# Patient Record
Sex: Female | Born: 1950
Health system: Southern US, Community
[De-identification: ages and names within clinical notes are randomized; demographics above are authoritative.]

## PROBLEM LIST (undated history)

## (undated) DIAGNOSIS — F419 Anxiety disorder, unspecified: Secondary | ICD-10-CM

## (undated) DIAGNOSIS — M1990 Unspecified osteoarthritis, unspecified site: Secondary | ICD-10-CM

## (undated) DIAGNOSIS — E079 Disorder of thyroid, unspecified: Secondary | ICD-10-CM

## (undated) DIAGNOSIS — E119 Type 2 diabetes mellitus without complications: Secondary | ICD-10-CM

## (undated) DIAGNOSIS — T7840XA Allergy, unspecified, initial encounter: Secondary | ICD-10-CM

## (undated) DIAGNOSIS — J449 Chronic obstructive pulmonary disease, unspecified: Secondary | ICD-10-CM

## (undated) DIAGNOSIS — E039 Hypothyroidism, unspecified: Secondary | ICD-10-CM

## (undated) DIAGNOSIS — F32A Depression, unspecified: Secondary | ICD-10-CM

## (undated) DIAGNOSIS — H269 Unspecified cataract: Secondary | ICD-10-CM

## (undated) DIAGNOSIS — L409 Psoriasis, unspecified: Secondary | ICD-10-CM

## (undated) HISTORY — DX: Unspecified cataract: H26.9

## (undated) HISTORY — PX: KNEE ARTHROSCOPY WITH MENISCAL REPAIR: SHX5653

## (undated) HISTORY — DX: Allergy, unspecified, initial encounter: T78.40XA

## (undated) HISTORY — PX: DENTAL SURGERY: SHX609

## (undated) HISTORY — PX: POLYPECTOMY: SHX149

## (undated) HISTORY — DX: Psoriasis, unspecified: L40.9

## (undated) HISTORY — PX: BREAST EXCISIONAL BIOPSY: SUR124

## (undated) HISTORY — DX: Chronic obstructive pulmonary disease, unspecified: J44.9

## (undated) HISTORY — PX: ABDOMINAL SURGERY: SHX537

## (undated) HISTORY — PX: COLONOSCOPY: SHX174

## (undated) HISTORY — DX: Anxiety disorder, unspecified: F41.9

## (undated) HISTORY — DX: Unspecified osteoarthritis, unspecified site: M19.90

---
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
## 1999-10-04 HISTORY — PX: ABDOMINAL HYSTERECTOMY: SHX81

## 2013-01-28 ENCOUNTER — Other Ambulatory Visit: Payer: Self-pay

## 2013-01-28 DIAGNOSIS — Z1231 Encounter for screening mammogram for malignant neoplasm of breast: Secondary | ICD-10-CM

## 2013-02-06 ENCOUNTER — Ambulatory Visit
Admission: RE | Admit: 2013-02-06 | Discharge: 2013-02-06 | Disposition: A | Discharge status: Home / Self Care | Payer: BC Managed Care – PPO | Source: Ambulatory Visit

## 2013-02-06 DIAGNOSIS — Z1231 Encounter for screening mammogram for malignant neoplasm of breast: Secondary | ICD-10-CM

## 2014-07-25 ENCOUNTER — Other Ambulatory Visit: Payer: Self-pay

## 2014-07-25 DIAGNOSIS — Z1231 Encounter for screening mammogram for malignant neoplasm of breast: Secondary | ICD-10-CM

## 2014-08-15 ENCOUNTER — Ambulatory Visit
Admission: RE | Admit: 2014-08-15 | Discharge: 2014-08-15 | Disposition: A | Discharge status: Home / Self Care | Payer: BC Managed Care – PPO | Source: Ambulatory Visit

## 2014-08-15 ENCOUNTER — Encounter (INDEPENDENT_AMBULATORY_CARE_PROVIDER_SITE_OTHER): Payer: Self-pay

## 2014-08-15 DIAGNOSIS — Z1231 Encounter for screening mammogram for malignant neoplasm of breast: Secondary | ICD-10-CM

## 2015-07-27 DIAGNOSIS — L409 Psoriasis, unspecified: Secondary | ICD-10-CM | POA: Diagnosis not present

## 2015-07-27 DIAGNOSIS — M2550 Pain in unspecified joint: Secondary | ICD-10-CM | POA: Diagnosis not present

## 2015-07-27 DIAGNOSIS — L920 Granuloma annulare: Secondary | ICD-10-CM | POA: Diagnosis not present

## 2015-07-27 DIAGNOSIS — B353 Tinea pedis: Secondary | ICD-10-CM | POA: Diagnosis not present

## 2015-08-06 DIAGNOSIS — H2513 Age-related nuclear cataract, bilateral: Secondary | ICD-10-CM | POA: Diagnosis not present

## 2015-08-06 DIAGNOSIS — H40013 Open angle with borderline findings, low risk, bilateral: Secondary | ICD-10-CM | POA: Diagnosis not present

## 2015-08-06 DIAGNOSIS — E119 Type 2 diabetes mellitus without complications: Secondary | ICD-10-CM | POA: Diagnosis not present

## 2015-08-17 DIAGNOSIS — M545 Low back pain: Secondary | ICD-10-CM | POA: Diagnosis not present

## 2015-08-17 DIAGNOSIS — L920 Granuloma annulare: Secondary | ICD-10-CM | POA: Diagnosis not present

## 2015-08-17 DIAGNOSIS — M25511 Pain in right shoulder: Secondary | ICD-10-CM | POA: Diagnosis not present

## 2015-08-17 DIAGNOSIS — L409 Psoriasis, unspecified: Secondary | ICD-10-CM | POA: Diagnosis not present

## 2015-09-01 DIAGNOSIS — L400 Psoriasis vulgaris: Secondary | ICD-10-CM | POA: Diagnosis not present

## 2015-09-01 DIAGNOSIS — L309 Dermatitis, unspecified: Secondary | ICD-10-CM | POA: Diagnosis not present

## 2015-09-14 ENCOUNTER — Other Ambulatory Visit: Payer: Self-pay | Admitting: Physician Assistant

## 2015-09-14 DIAGNOSIS — M25511 Pain in right shoulder: Secondary | ICD-10-CM

## 2015-09-24 ENCOUNTER — Ambulatory Visit
Admission: RE | Admit: 2015-09-24 | Discharge: 2015-09-24 | Disposition: A | Discharge status: Home / Self Care | Payer: Medicare Other | Source: Ambulatory Visit | Attending: Neurology | Admitting: Neurology

## 2015-09-24 DIAGNOSIS — M25511 Pain in right shoulder: Secondary | ICD-10-CM

## 2015-10-08 DIAGNOSIS — L920 Granuloma annulare: Secondary | ICD-10-CM | POA: Diagnosis not present

## 2015-10-08 DIAGNOSIS — M7501 Adhesive capsulitis of right shoulder: Secondary | ICD-10-CM | POA: Diagnosis not present

## 2015-10-08 DIAGNOSIS — M25511 Pain in right shoulder: Secondary | ICD-10-CM | POA: Diagnosis not present

## 2015-10-08 DIAGNOSIS — L409 Psoriasis, unspecified: Secondary | ICD-10-CM | POA: Diagnosis not present

## 2015-10-08 DIAGNOSIS — M545 Low back pain: Secondary | ICD-10-CM | POA: Diagnosis not present

## 2015-10-14 DIAGNOSIS — M6281 Muscle weakness (generalized): Secondary | ICD-10-CM | POA: Diagnosis not present

## 2015-10-14 DIAGNOSIS — M25611 Stiffness of right shoulder, not elsewhere classified: Secondary | ICD-10-CM | POA: Diagnosis not present

## 2015-10-14 DIAGNOSIS — R293 Abnormal posture: Secondary | ICD-10-CM | POA: Diagnosis not present

## 2015-10-14 DIAGNOSIS — M25511 Pain in right shoulder: Secondary | ICD-10-CM | POA: Diagnosis not present

## 2015-10-16 DIAGNOSIS — M25611 Stiffness of right shoulder, not elsewhere classified: Secondary | ICD-10-CM | POA: Diagnosis not present

## 2015-10-16 DIAGNOSIS — M6281 Muscle weakness (generalized): Secondary | ICD-10-CM | POA: Diagnosis not present

## 2015-10-16 DIAGNOSIS — M25511 Pain in right shoulder: Secondary | ICD-10-CM | POA: Diagnosis not present

## 2015-10-16 DIAGNOSIS — R293 Abnormal posture: Secondary | ICD-10-CM | POA: Diagnosis not present

## 2015-10-19 DIAGNOSIS — F329 Major depressive disorder, single episode, unspecified: Secondary | ICD-10-CM | POA: Diagnosis not present

## 2015-10-19 DIAGNOSIS — Z23 Encounter for immunization: Secondary | ICD-10-CM | POA: Diagnosis not present

## 2015-10-19 DIAGNOSIS — Z720 Tobacco use: Secondary | ICD-10-CM | POA: Diagnosis not present

## 2015-10-19 DIAGNOSIS — Z0000 Encounter for general adult medical examination without abnormal findings: Secondary | ICD-10-CM | POA: Diagnosis not present

## 2015-10-19 DIAGNOSIS — E119 Type 2 diabetes mellitus without complications: Secondary | ICD-10-CM | POA: Diagnosis not present

## 2015-10-19 DIAGNOSIS — Z86010 Personal history of colonic polyps: Secondary | ICD-10-CM | POA: Diagnosis not present

## 2015-10-19 DIAGNOSIS — L920 Granuloma annulare: Secondary | ICD-10-CM | POA: Diagnosis not present

## 2015-10-19 DIAGNOSIS — E039 Hypothyroidism, unspecified: Secondary | ICD-10-CM | POA: Diagnosis not present

## 2015-10-19 DIAGNOSIS — I10 Essential (primary) hypertension: Secondary | ICD-10-CM | POA: Diagnosis not present

## 2015-10-19 DIAGNOSIS — E7800 Pure hypercholesterolemia, unspecified: Secondary | ICD-10-CM | POA: Diagnosis not present

## 2015-10-20 DIAGNOSIS — M25511 Pain in right shoulder: Secondary | ICD-10-CM | POA: Diagnosis not present

## 2015-10-20 DIAGNOSIS — M25611 Stiffness of right shoulder, not elsewhere classified: Secondary | ICD-10-CM | POA: Diagnosis not present

## 2015-10-20 DIAGNOSIS — M6281 Muscle weakness (generalized): Secondary | ICD-10-CM | POA: Diagnosis not present

## 2015-10-20 DIAGNOSIS — R293 Abnormal posture: Secondary | ICD-10-CM | POA: Diagnosis not present

## 2015-10-21 DIAGNOSIS — M25511 Pain in right shoulder: Secondary | ICD-10-CM | POA: Diagnosis not present

## 2015-10-21 DIAGNOSIS — M6281 Muscle weakness (generalized): Secondary | ICD-10-CM | POA: Diagnosis not present

## 2015-10-21 DIAGNOSIS — M25611 Stiffness of right shoulder, not elsewhere classified: Secondary | ICD-10-CM | POA: Diagnosis not present

## 2015-10-21 DIAGNOSIS — R293 Abnormal posture: Secondary | ICD-10-CM | POA: Diagnosis not present

## 2015-10-22 DIAGNOSIS — M25611 Stiffness of right shoulder, not elsewhere classified: Secondary | ICD-10-CM | POA: Diagnosis not present

## 2015-10-22 DIAGNOSIS — M6281 Muscle weakness (generalized): Secondary | ICD-10-CM | POA: Diagnosis not present

## 2015-10-22 DIAGNOSIS — R293 Abnormal posture: Secondary | ICD-10-CM | POA: Diagnosis not present

## 2015-10-22 DIAGNOSIS — M25511 Pain in right shoulder: Secondary | ICD-10-CM | POA: Diagnosis not present

## 2015-10-26 DIAGNOSIS — R293 Abnormal posture: Secondary | ICD-10-CM | POA: Diagnosis not present

## 2015-10-26 DIAGNOSIS — M25511 Pain in right shoulder: Secondary | ICD-10-CM | POA: Diagnosis not present

## 2015-10-26 DIAGNOSIS — M25611 Stiffness of right shoulder, not elsewhere classified: Secondary | ICD-10-CM | POA: Diagnosis not present

## 2015-10-26 DIAGNOSIS — M6281 Muscle weakness (generalized): Secondary | ICD-10-CM | POA: Diagnosis not present

## 2015-10-28 DIAGNOSIS — M25511 Pain in right shoulder: Secondary | ICD-10-CM | POA: Diagnosis not present

## 2015-10-28 DIAGNOSIS — M25611 Stiffness of right shoulder, not elsewhere classified: Secondary | ICD-10-CM | POA: Diagnosis not present

## 2015-10-28 DIAGNOSIS — R293 Abnormal posture: Secondary | ICD-10-CM | POA: Diagnosis not present

## 2015-10-28 DIAGNOSIS — M6281 Muscle weakness (generalized): Secondary | ICD-10-CM | POA: Diagnosis not present

## 2015-10-30 DIAGNOSIS — M6281 Muscle weakness (generalized): Secondary | ICD-10-CM | POA: Diagnosis not present

## 2015-10-30 DIAGNOSIS — M25511 Pain in right shoulder: Secondary | ICD-10-CM | POA: Diagnosis not present

## 2015-10-30 DIAGNOSIS — M25611 Stiffness of right shoulder, not elsewhere classified: Secondary | ICD-10-CM | POA: Diagnosis not present

## 2015-10-30 DIAGNOSIS — R293 Abnormal posture: Secondary | ICD-10-CM | POA: Diagnosis not present

## 2015-11-02 DIAGNOSIS — M6281 Muscle weakness (generalized): Secondary | ICD-10-CM | POA: Diagnosis not present

## 2015-11-02 DIAGNOSIS — R293 Abnormal posture: Secondary | ICD-10-CM | POA: Diagnosis not present

## 2015-11-02 DIAGNOSIS — M25611 Stiffness of right shoulder, not elsewhere classified: Secondary | ICD-10-CM | POA: Diagnosis not present

## 2015-11-02 DIAGNOSIS — M25511 Pain in right shoulder: Secondary | ICD-10-CM | POA: Diagnosis not present

## 2015-11-04 DIAGNOSIS — M25611 Stiffness of right shoulder, not elsewhere classified: Secondary | ICD-10-CM | POA: Diagnosis not present

## 2015-11-04 DIAGNOSIS — M25511 Pain in right shoulder: Secondary | ICD-10-CM | POA: Diagnosis not present

## 2015-11-04 DIAGNOSIS — M6281 Muscle weakness (generalized): Secondary | ICD-10-CM | POA: Diagnosis not present

## 2015-11-04 DIAGNOSIS — R293 Abnormal posture: Secondary | ICD-10-CM | POA: Diagnosis not present

## 2015-11-06 DIAGNOSIS — M25511 Pain in right shoulder: Secondary | ICD-10-CM | POA: Diagnosis not present

## 2015-11-06 DIAGNOSIS — R293 Abnormal posture: Secondary | ICD-10-CM | POA: Diagnosis not present

## 2015-11-06 DIAGNOSIS — M25611 Stiffness of right shoulder, not elsewhere classified: Secondary | ICD-10-CM | POA: Diagnosis not present

## 2015-11-06 DIAGNOSIS — M6281 Muscle weakness (generalized): Secondary | ICD-10-CM | POA: Diagnosis not present

## 2015-11-09 DIAGNOSIS — M25511 Pain in right shoulder: Secondary | ICD-10-CM | POA: Diagnosis not present

## 2015-11-09 DIAGNOSIS — M25611 Stiffness of right shoulder, not elsewhere classified: Secondary | ICD-10-CM | POA: Diagnosis not present

## 2015-11-09 DIAGNOSIS — R293 Abnormal posture: Secondary | ICD-10-CM | POA: Diagnosis not present

## 2015-11-09 DIAGNOSIS — M6281 Muscle weakness (generalized): Secondary | ICD-10-CM | POA: Diagnosis not present

## 2015-11-11 DIAGNOSIS — M6281 Muscle weakness (generalized): Secondary | ICD-10-CM | POA: Diagnosis not present

## 2015-11-11 DIAGNOSIS — M25511 Pain in right shoulder: Secondary | ICD-10-CM | POA: Diagnosis not present

## 2015-11-11 DIAGNOSIS — R293 Abnormal posture: Secondary | ICD-10-CM | POA: Diagnosis not present

## 2015-11-11 DIAGNOSIS — M25611 Stiffness of right shoulder, not elsewhere classified: Secondary | ICD-10-CM | POA: Diagnosis not present

## 2015-11-13 DIAGNOSIS — M25611 Stiffness of right shoulder, not elsewhere classified: Secondary | ICD-10-CM | POA: Diagnosis not present

## 2015-11-13 DIAGNOSIS — M25511 Pain in right shoulder: Secondary | ICD-10-CM | POA: Diagnosis not present

## 2015-11-13 DIAGNOSIS — M6281 Muscle weakness (generalized): Secondary | ICD-10-CM | POA: Diagnosis not present

## 2015-11-13 DIAGNOSIS — R293 Abnormal posture: Secondary | ICD-10-CM | POA: Diagnosis not present

## 2015-11-16 DIAGNOSIS — R293 Abnormal posture: Secondary | ICD-10-CM | POA: Diagnosis not present

## 2015-11-16 DIAGNOSIS — M25511 Pain in right shoulder: Secondary | ICD-10-CM | POA: Diagnosis not present

## 2015-11-16 DIAGNOSIS — M6281 Muscle weakness (generalized): Secondary | ICD-10-CM | POA: Diagnosis not present

## 2015-11-16 DIAGNOSIS — M25611 Stiffness of right shoulder, not elsewhere classified: Secondary | ICD-10-CM | POA: Diagnosis not present

## 2015-11-18 DIAGNOSIS — M25511 Pain in right shoulder: Secondary | ICD-10-CM | POA: Diagnosis not present

## 2015-11-18 DIAGNOSIS — M25611 Stiffness of right shoulder, not elsewhere classified: Secondary | ICD-10-CM | POA: Diagnosis not present

## 2015-11-18 DIAGNOSIS — M6281 Muscle weakness (generalized): Secondary | ICD-10-CM | POA: Diagnosis not present

## 2015-11-18 DIAGNOSIS — R293 Abnormal posture: Secondary | ICD-10-CM | POA: Diagnosis not present

## 2015-11-20 DIAGNOSIS — R293 Abnormal posture: Secondary | ICD-10-CM | POA: Diagnosis not present

## 2015-11-20 DIAGNOSIS — M25511 Pain in right shoulder: Secondary | ICD-10-CM | POA: Diagnosis not present

## 2015-11-20 DIAGNOSIS — M25611 Stiffness of right shoulder, not elsewhere classified: Secondary | ICD-10-CM | POA: Diagnosis not present

## 2015-11-20 DIAGNOSIS — M6281 Muscle weakness (generalized): Secondary | ICD-10-CM | POA: Diagnosis not present

## 2015-11-25 DIAGNOSIS — D124 Benign neoplasm of descending colon: Secondary | ICD-10-CM | POA: Diagnosis not present

## 2015-11-25 DIAGNOSIS — K640 First degree hemorrhoids: Secondary | ICD-10-CM | POA: Diagnosis not present

## 2015-11-25 DIAGNOSIS — K5730 Diverticulosis of large intestine without perforation or abscess without bleeding: Secondary | ICD-10-CM | POA: Diagnosis not present

## 2015-11-25 DIAGNOSIS — Z86010 Personal history of colonic polyps: Secondary | ICD-10-CM | POA: Diagnosis not present

## 2015-11-25 DIAGNOSIS — D126 Benign neoplasm of colon, unspecified: Secondary | ICD-10-CM | POA: Diagnosis not present

## 2015-11-25 LAB — HM COLONOSCOPY

## 2015-11-27 DIAGNOSIS — R293 Abnormal posture: Secondary | ICD-10-CM | POA: Diagnosis not present

## 2015-11-27 DIAGNOSIS — M25511 Pain in right shoulder: Secondary | ICD-10-CM | POA: Diagnosis not present

## 2015-11-27 DIAGNOSIS — M25611 Stiffness of right shoulder, not elsewhere classified: Secondary | ICD-10-CM | POA: Diagnosis not present

## 2015-11-27 DIAGNOSIS — M6281 Muscle weakness (generalized): Secondary | ICD-10-CM | POA: Diagnosis not present

## 2015-11-30 DIAGNOSIS — R293 Abnormal posture: Secondary | ICD-10-CM | POA: Diagnosis not present

## 2015-11-30 DIAGNOSIS — M25611 Stiffness of right shoulder, not elsewhere classified: Secondary | ICD-10-CM | POA: Diagnosis not present

## 2015-11-30 DIAGNOSIS — M25511 Pain in right shoulder: Secondary | ICD-10-CM | POA: Diagnosis not present

## 2015-11-30 DIAGNOSIS — M6281 Muscle weakness (generalized): Secondary | ICD-10-CM | POA: Diagnosis not present

## 2015-12-02 DIAGNOSIS — M6281 Muscle weakness (generalized): Secondary | ICD-10-CM | POA: Diagnosis not present

## 2015-12-02 DIAGNOSIS — M25511 Pain in right shoulder: Secondary | ICD-10-CM | POA: Diagnosis not present

## 2015-12-02 DIAGNOSIS — M25611 Stiffness of right shoulder, not elsewhere classified: Secondary | ICD-10-CM | POA: Diagnosis not present

## 2015-12-02 DIAGNOSIS — R293 Abnormal posture: Secondary | ICD-10-CM | POA: Diagnosis not present

## 2015-12-04 DIAGNOSIS — M6281 Muscle weakness (generalized): Secondary | ICD-10-CM | POA: Diagnosis not present

## 2015-12-04 DIAGNOSIS — M25611 Stiffness of right shoulder, not elsewhere classified: Secondary | ICD-10-CM | POA: Diagnosis not present

## 2015-12-04 DIAGNOSIS — R293 Abnormal posture: Secondary | ICD-10-CM | POA: Diagnosis not present

## 2015-12-04 DIAGNOSIS — M25511 Pain in right shoulder: Secondary | ICD-10-CM | POA: Diagnosis not present

## 2015-12-07 DIAGNOSIS — R293 Abnormal posture: Secondary | ICD-10-CM | POA: Diagnosis not present

## 2015-12-07 DIAGNOSIS — M6281 Muscle weakness (generalized): Secondary | ICD-10-CM | POA: Diagnosis not present

## 2015-12-07 DIAGNOSIS — M25611 Stiffness of right shoulder, not elsewhere classified: Secondary | ICD-10-CM | POA: Diagnosis not present

## 2015-12-07 DIAGNOSIS — M25511 Pain in right shoulder: Secondary | ICD-10-CM | POA: Diagnosis not present

## 2015-12-14 DIAGNOSIS — M25511 Pain in right shoulder: Secondary | ICD-10-CM | POA: Diagnosis not present

## 2015-12-14 DIAGNOSIS — M6281 Muscle weakness (generalized): Secondary | ICD-10-CM | POA: Diagnosis not present

## 2015-12-14 DIAGNOSIS — R293 Abnormal posture: Secondary | ICD-10-CM | POA: Diagnosis not present

## 2015-12-14 DIAGNOSIS — M25611 Stiffness of right shoulder, not elsewhere classified: Secondary | ICD-10-CM | POA: Diagnosis not present

## 2015-12-21 DIAGNOSIS — M6281 Muscle weakness (generalized): Secondary | ICD-10-CM | POA: Diagnosis not present

## 2015-12-21 DIAGNOSIS — R293 Abnormal posture: Secondary | ICD-10-CM | POA: Diagnosis not present

## 2015-12-21 DIAGNOSIS — M25611 Stiffness of right shoulder, not elsewhere classified: Secondary | ICD-10-CM | POA: Diagnosis not present

## 2015-12-21 DIAGNOSIS — M25511 Pain in right shoulder: Secondary | ICD-10-CM | POA: Diagnosis not present

## 2015-12-25 DIAGNOSIS — R293 Abnormal posture: Secondary | ICD-10-CM | POA: Diagnosis not present

## 2015-12-25 DIAGNOSIS — M6281 Muscle weakness (generalized): Secondary | ICD-10-CM | POA: Diagnosis not present

## 2015-12-25 DIAGNOSIS — M25611 Stiffness of right shoulder, not elsewhere classified: Secondary | ICD-10-CM | POA: Diagnosis not present

## 2015-12-25 DIAGNOSIS — M25511 Pain in right shoulder: Secondary | ICD-10-CM | POA: Diagnosis not present

## 2015-12-28 DIAGNOSIS — M6281 Muscle weakness (generalized): Secondary | ICD-10-CM | POA: Diagnosis not present

## 2015-12-28 DIAGNOSIS — M25611 Stiffness of right shoulder, not elsewhere classified: Secondary | ICD-10-CM | POA: Diagnosis not present

## 2015-12-28 DIAGNOSIS — M25511 Pain in right shoulder: Secondary | ICD-10-CM | POA: Diagnosis not present

## 2015-12-28 DIAGNOSIS — R293 Abnormal posture: Secondary | ICD-10-CM | POA: Diagnosis not present

## 2016-01-01 DIAGNOSIS — M6281 Muscle weakness (generalized): Secondary | ICD-10-CM | POA: Diagnosis not present

## 2016-01-01 DIAGNOSIS — M25611 Stiffness of right shoulder, not elsewhere classified: Secondary | ICD-10-CM | POA: Diagnosis not present

## 2016-01-01 DIAGNOSIS — R293 Abnormal posture: Secondary | ICD-10-CM | POA: Diagnosis not present

## 2016-01-01 DIAGNOSIS — M25511 Pain in right shoulder: Secondary | ICD-10-CM | POA: Diagnosis not present

## 2016-01-04 DIAGNOSIS — M25511 Pain in right shoulder: Secondary | ICD-10-CM | POA: Diagnosis not present

## 2016-01-04 DIAGNOSIS — M6281 Muscle weakness (generalized): Secondary | ICD-10-CM | POA: Diagnosis not present

## 2016-01-04 DIAGNOSIS — R293 Abnormal posture: Secondary | ICD-10-CM | POA: Diagnosis not present

## 2016-01-04 DIAGNOSIS — M25611 Stiffness of right shoulder, not elsewhere classified: Secondary | ICD-10-CM | POA: Diagnosis not present

## 2016-01-11 DIAGNOSIS — M25611 Stiffness of right shoulder, not elsewhere classified: Secondary | ICD-10-CM | POA: Diagnosis not present

## 2016-01-11 DIAGNOSIS — M25511 Pain in right shoulder: Secondary | ICD-10-CM | POA: Diagnosis not present

## 2016-01-11 DIAGNOSIS — R293 Abnormal posture: Secondary | ICD-10-CM | POA: Diagnosis not present

## 2016-01-11 DIAGNOSIS — M6281 Muscle weakness (generalized): Secondary | ICD-10-CM | POA: Diagnosis not present

## 2016-01-19 DIAGNOSIS — M25611 Stiffness of right shoulder, not elsewhere classified: Secondary | ICD-10-CM | POA: Diagnosis not present

## 2016-01-19 DIAGNOSIS — M6281 Muscle weakness (generalized): Secondary | ICD-10-CM | POA: Diagnosis not present

## 2016-01-19 DIAGNOSIS — R293 Abnormal posture: Secondary | ICD-10-CM | POA: Diagnosis not present

## 2016-01-19 DIAGNOSIS — M25511 Pain in right shoulder: Secondary | ICD-10-CM | POA: Diagnosis not present

## 2016-02-01 DIAGNOSIS — M6281 Muscle weakness (generalized): Secondary | ICD-10-CM | POA: Diagnosis not present

## 2016-02-01 DIAGNOSIS — M25611 Stiffness of right shoulder, not elsewhere classified: Secondary | ICD-10-CM | POA: Diagnosis not present

## 2016-02-01 DIAGNOSIS — M25511 Pain in right shoulder: Secondary | ICD-10-CM | POA: Diagnosis not present

## 2016-02-01 DIAGNOSIS — R293 Abnormal posture: Secondary | ICD-10-CM | POA: Diagnosis not present

## 2016-02-10 DIAGNOSIS — M25611 Stiffness of right shoulder, not elsewhere classified: Secondary | ICD-10-CM | POA: Diagnosis not present

## 2016-02-10 DIAGNOSIS — M25511 Pain in right shoulder: Secondary | ICD-10-CM | POA: Diagnosis not present

## 2016-02-10 DIAGNOSIS — M6281 Muscle weakness (generalized): Secondary | ICD-10-CM | POA: Diagnosis not present

## 2016-02-10 DIAGNOSIS — R293 Abnormal posture: Secondary | ICD-10-CM | POA: Diagnosis not present

## 2016-03-01 DIAGNOSIS — L400 Psoriasis vulgaris: Secondary | ICD-10-CM | POA: Diagnosis not present

## 2016-04-20 DIAGNOSIS — I10 Essential (primary) hypertension: Secondary | ICD-10-CM | POA: Diagnosis not present

## 2016-04-20 DIAGNOSIS — Z7984 Long term (current) use of oral hypoglycemic drugs: Secondary | ICD-10-CM | POA: Diagnosis not present

## 2016-04-20 DIAGNOSIS — F329 Major depressive disorder, single episode, unspecified: Secondary | ICD-10-CM | POA: Diagnosis not present

## 2016-04-20 DIAGNOSIS — E119 Type 2 diabetes mellitus without complications: Secondary | ICD-10-CM | POA: Diagnosis not present

## 2016-04-20 DIAGNOSIS — E039 Hypothyroidism, unspecified: Secondary | ICD-10-CM | POA: Diagnosis not present

## 2016-04-20 DIAGNOSIS — E663 Overweight: Secondary | ICD-10-CM | POA: Diagnosis not present

## 2016-04-20 DIAGNOSIS — Z6826 Body mass index (BMI) 26.0-26.9, adult: Secondary | ICD-10-CM | POA: Diagnosis not present

## 2016-04-20 DIAGNOSIS — M25511 Pain in right shoulder: Secondary | ICD-10-CM | POA: Diagnosis not present

## 2016-04-20 DIAGNOSIS — E7800 Pure hypercholesterolemia, unspecified: Secondary | ICD-10-CM | POA: Diagnosis not present

## 2016-05-05 DIAGNOSIS — M7501 Adhesive capsulitis of right shoulder: Secondary | ICD-10-CM | POA: Diagnosis not present

## 2016-08-11 DIAGNOSIS — H40012 Open angle with borderline findings, low risk, left eye: Secondary | ICD-10-CM | POA: Diagnosis not present

## 2016-08-11 DIAGNOSIS — H2513 Age-related nuclear cataract, bilateral: Secondary | ICD-10-CM | POA: Diagnosis not present

## 2016-08-11 DIAGNOSIS — H40011 Open angle with borderline findings, low risk, right eye: Secondary | ICD-10-CM | POA: Diagnosis not present

## 2016-08-11 DIAGNOSIS — Z0100 Encounter for examination of eyes and vision without abnormal findings: Secondary | ICD-10-CM | POA: Diagnosis not present

## 2016-08-17 ENCOUNTER — Emergency Department
Admission: EM | Admit: 2016-08-17 | Discharge: 2016-08-17 | Disposition: A | Discharge status: Home / Self Care | Payer: No Typology Code available for payment source | Attending: Emergency Medicine | Admitting: Emergency Medicine

## 2016-08-17 ENCOUNTER — Emergency Department: Payer: No Typology Code available for payment source

## 2016-08-17 DIAGNOSIS — S161XXA Strain of muscle, fascia and tendon at neck level, initial encounter: Secondary | ICD-10-CM | POA: Diagnosis not present

## 2016-08-17 DIAGNOSIS — Y999 Unspecified external cause status: Secondary | ICD-10-CM | POA: Insufficient documentation

## 2016-08-17 DIAGNOSIS — M7918 Myalgia, other site: Secondary | ICD-10-CM

## 2016-08-17 DIAGNOSIS — M25511 Pain in right shoulder: Secondary | ICD-10-CM | POA: Diagnosis not present

## 2016-08-17 DIAGNOSIS — Y9389 Activity, other specified: Secondary | ICD-10-CM | POA: Insufficient documentation

## 2016-08-17 DIAGNOSIS — S4991XA Unspecified injury of right shoulder and upper arm, initial encounter: Secondary | ICD-10-CM | POA: Diagnosis not present

## 2016-08-17 DIAGNOSIS — Y92410 Unspecified street and highway as the place of occurrence of the external cause: Secondary | ICD-10-CM | POA: Diagnosis not present

## 2016-08-17 DIAGNOSIS — S199XXA Unspecified injury of neck, initial encounter: Secondary | ICD-10-CM | POA: Diagnosis present

## 2016-08-17 DIAGNOSIS — M791 Myalgia: Secondary | ICD-10-CM | POA: Diagnosis not present

## 2016-08-17 MED ORDER — NAPROXEN 500 MG PO TABS
500.0000 mg | ORAL_TABLET | Freq: Once | ORAL | Status: AC
  Administered 2016-08-17: 500 mg via ORAL
  Filled 2016-08-17: qty 1

## 2016-08-17 MED ORDER — CYCLOBENZAPRINE HCL 10 MG PO TABS
ORAL_TABLET | ORAL | Status: AC
  Filled 2016-08-17: qty 1

## 2016-08-17 MED ORDER — CYCLOBENZAPRINE HCL 10 MG PO TABS
10.0000 mg | ORAL_TABLET | Freq: Once | ORAL | Status: AC
  Administered 2016-08-17: 10 mg via ORAL
  Filled 2016-08-17: qty 1

## 2016-08-17 MED ORDER — MELOXICAM 15 MG PO TABS: 15.0000 mg | ORAL_TABLET | Freq: Every day | ORAL | 0 refills | Status: DC

## 2016-08-17 MED ORDER — CYCLOBENZAPRINE HCL 10 MG PO TABS: 5.0000 mg | ORAL_TABLET | Freq: Three times a day (TID) | ORAL | 0 refills | Status: DC | PRN

## 2016-08-17 NOTE — ED Notes (Signed)
Pt verbalized understanding of discharge instructions. NAD at this time. 
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
## 2016-08-20 MED ORDER — FENTANYL CITRATE (PF) 100 MCG/2ML IJ SOLN
INTRAMUSCULAR | Status: AC
  Filled 2016-08-20: qty 2

## 2016-08-30 DIAGNOSIS — L821 Other seborrheic keratosis: Secondary | ICD-10-CM | POA: Diagnosis not present

## 2016-08-30 DIAGNOSIS — L820 Inflamed seborrheic keratosis: Secondary | ICD-10-CM | POA: Diagnosis not present

## 2016-08-30 DIAGNOSIS — L400 Psoriasis vulgaris: Secondary | ICD-10-CM | POA: Diagnosis not present

## 2016-08-30 DIAGNOSIS — L814 Other melanin hyperpigmentation: Secondary | ICD-10-CM | POA: Diagnosis not present

## 2016-08-30 DIAGNOSIS — L920 Granuloma annulare: Secondary | ICD-10-CM | POA: Diagnosis not present

## 2016-08-30 DIAGNOSIS — D1801 Hemangioma of skin and subcutaneous tissue: Secondary | ICD-10-CM | POA: Diagnosis not present

## 2016-08-30 DIAGNOSIS — D235 Other benign neoplasm of skin of trunk: Secondary | ICD-10-CM | POA: Diagnosis not present

## 2016-09-14 ENCOUNTER — Other Ambulatory Visit: Payer: Self-pay | Admitting: Family Medicine

## 2016-09-14 DIAGNOSIS — Z1231 Encounter for screening mammogram for malignant neoplasm of breast: Secondary | ICD-10-CM

## 2016-10-03 HISTORY — PX: EYE SURGERY: SHX253

## 2016-10-11 DIAGNOSIS — Z0000 Encounter for general adult medical examination without abnormal findings: Secondary | ICD-10-CM | POA: Diagnosis not present

## 2016-10-11 DIAGNOSIS — Z780 Asymptomatic menopausal state: Secondary | ICD-10-CM | POA: Diagnosis not present

## 2016-10-11 DIAGNOSIS — G4700 Insomnia, unspecified: Secondary | ICD-10-CM | POA: Diagnosis not present

## 2016-10-11 DIAGNOSIS — F329 Major depressive disorder, single episode, unspecified: Secondary | ICD-10-CM | POA: Diagnosis not present

## 2016-10-11 DIAGNOSIS — E663 Overweight: Secondary | ICD-10-CM | POA: Diagnosis not present

## 2016-10-11 DIAGNOSIS — E039 Hypothyroidism, unspecified: Secondary | ICD-10-CM | POA: Diagnosis not present

## 2016-10-11 DIAGNOSIS — R0981 Nasal congestion: Secondary | ICD-10-CM | POA: Diagnosis not present

## 2016-10-11 DIAGNOSIS — E119 Type 2 diabetes mellitus without complications: Secondary | ICD-10-CM | POA: Diagnosis not present

## 2016-10-11 DIAGNOSIS — Z720 Tobacco use: Secondary | ICD-10-CM | POA: Diagnosis not present

## 2016-10-11 DIAGNOSIS — R5383 Other fatigue: Secondary | ICD-10-CM | POA: Diagnosis not present

## 2016-10-11 DIAGNOSIS — L920 Granuloma annulare: Secondary | ICD-10-CM | POA: Diagnosis not present

## 2016-10-11 DIAGNOSIS — E7800 Pure hypercholesterolemia, unspecified: Secondary | ICD-10-CM | POA: Diagnosis not present

## 2016-10-27 ENCOUNTER — Ambulatory Visit
Admission: RE | Admit: 2016-10-27 | Discharge: 2016-10-27 | Disposition: A | Discharge status: Home / Self Care | Payer: Medicare Other | Source: Ambulatory Visit | Attending: Family Medicine | Admitting: Family Medicine

## 2016-10-27 DIAGNOSIS — Z1231 Encounter for screening mammogram for malignant neoplasm of breast: Secondary | ICD-10-CM | POA: Diagnosis not present

## 2016-11-08 DIAGNOSIS — H25812 Combined forms of age-related cataract, left eye: Secondary | ICD-10-CM | POA: Diagnosis not present

## 2016-11-08 DIAGNOSIS — H2512 Age-related nuclear cataract, left eye: Secondary | ICD-10-CM | POA: Diagnosis not present

## 2016-11-21 DIAGNOSIS — M8588 Other specified disorders of bone density and structure, other site: Secondary | ICD-10-CM | POA: Diagnosis not present

## 2016-11-21 DIAGNOSIS — Z780 Asymptomatic menopausal state: Secondary | ICD-10-CM | POA: Diagnosis not present

## 2016-12-16 DIAGNOSIS — R197 Diarrhea, unspecified: Secondary | ICD-10-CM | POA: Diagnosis not present

## 2016-12-16 DIAGNOSIS — Z720 Tobacco use: Secondary | ICD-10-CM | POA: Diagnosis not present

## 2016-12-16 DIAGNOSIS — R5383 Other fatigue: Secondary | ICD-10-CM | POA: Diagnosis not present

## 2016-12-16 DIAGNOSIS — R232 Flushing: Secondary | ICD-10-CM | POA: Diagnosis not present

## 2016-12-22 DIAGNOSIS — D72829 Elevated white blood cell count, unspecified: Secondary | ICD-10-CM | POA: Diagnosis not present

## 2016-12-22 DIAGNOSIS — R110 Nausea: Secondary | ICD-10-CM | POA: Diagnosis not present

## 2016-12-22 DIAGNOSIS — R8290 Unspecified abnormal findings in urine: Secondary | ICD-10-CM | POA: Diagnosis not present

## 2016-12-27 DIAGNOSIS — H2511 Age-related nuclear cataract, right eye: Secondary | ICD-10-CM | POA: Diagnosis not present

## 2016-12-27 DIAGNOSIS — H25811 Combined forms of age-related cataract, right eye: Secondary | ICD-10-CM | POA: Diagnosis not present

## 2016-12-27 DIAGNOSIS — H5211 Myopia, right eye: Secondary | ICD-10-CM | POA: Diagnosis not present

## 2017-04-11 DIAGNOSIS — L400 Psoriasis vulgaris: Secondary | ICD-10-CM | POA: Diagnosis not present

## 2017-04-17 DIAGNOSIS — E7800 Pure hypercholesterolemia, unspecified: Secondary | ICD-10-CM | POA: Diagnosis not present

## 2017-04-17 DIAGNOSIS — E039 Hypothyroidism, unspecified: Secondary | ICD-10-CM | POA: Diagnosis not present

## 2017-04-17 DIAGNOSIS — Z7984 Long term (current) use of oral hypoglycemic drugs: Secondary | ICD-10-CM | POA: Diagnosis not present

## 2017-04-17 DIAGNOSIS — F329 Major depressive disorder, single episode, unspecified: Secondary | ICD-10-CM | POA: Diagnosis not present

## 2017-04-17 DIAGNOSIS — E119 Type 2 diabetes mellitus without complications: Secondary | ICD-10-CM | POA: Diagnosis not present

## 2017-06-01 DIAGNOSIS — Z961 Presence of intraocular lens: Secondary | ICD-10-CM | POA: Diagnosis not present

## 2017-06-01 DIAGNOSIS — H1859 Other hereditary corneal dystrophies: Secondary | ICD-10-CM | POA: Diagnosis not present

## 2017-06-27 DIAGNOSIS — L400 Psoriasis vulgaris: Secondary | ICD-10-CM | POA: Diagnosis not present

## 2017-07-04 DIAGNOSIS — M7542 Impingement syndrome of left shoulder: Secondary | ICD-10-CM | POA: Diagnosis not present

## 2017-08-01 DIAGNOSIS — E7800 Pure hypercholesterolemia, unspecified: Secondary | ICD-10-CM | POA: Diagnosis not present

## 2017-08-01 DIAGNOSIS — E119 Type 2 diabetes mellitus without complications: Secondary | ICD-10-CM | POA: Diagnosis not present

## 2017-08-01 DIAGNOSIS — F329 Major depressive disorder, single episode, unspecified: Secondary | ICD-10-CM | POA: Diagnosis not present

## 2017-08-01 DIAGNOSIS — E039 Hypothyroidism, unspecified: Secondary | ICD-10-CM | POA: Diagnosis not present

## 2017-08-04 DIAGNOSIS — M7542 Impingement syndrome of left shoulder: Secondary | ICD-10-CM | POA: Diagnosis not present

## 2017-08-15 DIAGNOSIS — M7542 Impingement syndrome of left shoulder: Secondary | ICD-10-CM | POA: Diagnosis not present

## 2017-08-15 DIAGNOSIS — M5030 Other cervical disc degeneration, unspecified cervical region: Secondary | ICD-10-CM | POA: Diagnosis not present

## 2017-08-22 DIAGNOSIS — L400 Psoriasis vulgaris: Secondary | ICD-10-CM | POA: Diagnosis not present

## 2017-08-29 DIAGNOSIS — M7542 Impingement syndrome of left shoulder: Secondary | ICD-10-CM | POA: Diagnosis not present

## 2017-09-08 DIAGNOSIS — M7542 Impingement syndrome of left shoulder: Secondary | ICD-10-CM | POA: Diagnosis not present

## 2017-09-13 DIAGNOSIS — M7542 Impingement syndrome of left shoulder: Secondary | ICD-10-CM | POA: Diagnosis not present

## 2017-09-13 DIAGNOSIS — M5030 Other cervical disc degeneration, unspecified cervical region: Secondary | ICD-10-CM | POA: Diagnosis not present

## 2017-10-10 DIAGNOSIS — F329 Major depressive disorder, single episode, unspecified: Secondary | ICD-10-CM | POA: Diagnosis not present

## 2017-10-10 DIAGNOSIS — E039 Hypothyroidism, unspecified: Secondary | ICD-10-CM | POA: Diagnosis not present

## 2017-10-10 DIAGNOSIS — E7800 Pure hypercholesterolemia, unspecified: Secondary | ICD-10-CM | POA: Diagnosis not present

## 2017-10-10 DIAGNOSIS — E119 Type 2 diabetes mellitus without complications: Secondary | ICD-10-CM | POA: Diagnosis not present

## 2017-10-18 DIAGNOSIS — I10 Essential (primary) hypertension: Secondary | ICD-10-CM | POA: Diagnosis not present

## 2017-10-18 DIAGNOSIS — E663 Overweight: Secondary | ICD-10-CM | POA: Diagnosis not present

## 2017-10-18 DIAGNOSIS — M8580 Other specified disorders of bone density and structure, unspecified site: Secondary | ICD-10-CM | POA: Diagnosis not present

## 2017-10-18 DIAGNOSIS — Z86010 Personal history of colonic polyps: Secondary | ICD-10-CM | POA: Diagnosis not present

## 2017-10-18 DIAGNOSIS — E7800 Pure hypercholesterolemia, unspecified: Secondary | ICD-10-CM | POA: Diagnosis not present

## 2017-10-18 DIAGNOSIS — Z720 Tobacco use: Secondary | ICD-10-CM | POA: Diagnosis not present

## 2017-10-18 DIAGNOSIS — Z0000 Encounter for general adult medical examination without abnormal findings: Secondary | ICD-10-CM | POA: Diagnosis not present

## 2017-10-18 DIAGNOSIS — R05 Cough: Secondary | ICD-10-CM | POA: Diagnosis not present

## 2017-10-18 DIAGNOSIS — Z23 Encounter for immunization: Secondary | ICD-10-CM | POA: Diagnosis not present

## 2017-10-18 DIAGNOSIS — E119 Type 2 diabetes mellitus without complications: Secondary | ICD-10-CM | POA: Diagnosis not present

## 2017-10-18 DIAGNOSIS — E039 Hypothyroidism, unspecified: Secondary | ICD-10-CM | POA: Diagnosis not present

## 2017-10-18 DIAGNOSIS — G4700 Insomnia, unspecified: Secondary | ICD-10-CM | POA: Diagnosis not present

## 2017-12-28 ENCOUNTER — Other Ambulatory Visit: Payer: Self-pay | Admitting: Family Medicine

## 2017-12-28 DIAGNOSIS — Z1231 Encounter for screening mammogram for malignant neoplasm of breast: Secondary | ICD-10-CM

## 2018-01-23 ENCOUNTER — Ambulatory Visit
Admission: RE | Admit: 2018-01-23 | Discharge: 2018-01-23 | Disposition: A | Discharge status: Home / Self Care | Payer: Medicare Other | Source: Ambulatory Visit | Attending: Family Medicine | Admitting: Family Medicine

## 2018-01-23 DIAGNOSIS — Z1231 Encounter for screening mammogram for malignant neoplasm of breast: Secondary | ICD-10-CM | POA: Diagnosis not present

## 2018-01-23 LAB — HM MAMMOGRAPHY

## 2018-03-18 ENCOUNTER — Emergency Department
Admission: EM | Admit: 2018-03-18 | Discharge: 2018-03-18 | Disposition: A | Discharge status: Home / Self Care | Payer: Medicare Other | Attending: Student in an Organized Health Care Education/Training Program | Admitting: Student in an Organized Health Care Education/Training Program

## 2018-03-18 ENCOUNTER — Encounter: Payer: Self-pay | Admitting: Emergency Medicine

## 2018-03-18 DIAGNOSIS — Y999 Unspecified external cause status: Secondary | ICD-10-CM | POA: Insufficient documentation

## 2018-03-18 DIAGNOSIS — L03115 Cellulitis of right lower limb: Secondary | ICD-10-CM | POA: Diagnosis not present

## 2018-03-18 DIAGNOSIS — T148XXA Other injury of unspecified body region, initial encounter: Secondary | ICD-10-CM

## 2018-03-18 DIAGNOSIS — F17210 Nicotine dependence, cigarettes, uncomplicated: Secondary | ICD-10-CM | POA: Diagnosis not present

## 2018-03-18 DIAGNOSIS — E119 Type 2 diabetes mellitus without complications: Secondary | ICD-10-CM | POA: Diagnosis not present

## 2018-03-18 DIAGNOSIS — X58XXXA Exposure to other specified factors, initial encounter: Secondary | ICD-10-CM | POA: Insufficient documentation

## 2018-03-18 DIAGNOSIS — S90521A Blister (nonthermal), right ankle, initial encounter: Secondary | ICD-10-CM | POA: Insufficient documentation

## 2018-03-18 DIAGNOSIS — Y929 Unspecified place or not applicable: Secondary | ICD-10-CM | POA: Diagnosis not present

## 2018-03-18 DIAGNOSIS — Z79899 Other long term (current) drug therapy: Secondary | ICD-10-CM | POA: Diagnosis not present

## 2018-03-18 DIAGNOSIS — Y939 Activity, unspecified: Secondary | ICD-10-CM | POA: Insufficient documentation

## 2018-03-18 HISTORY — DX: Disorder of thyroid, unspecified: E07.9

## 2018-03-18 HISTORY — DX: Type 2 diabetes mellitus without complications: E11.9

## 2018-03-18 MED ORDER — NEOMYCIN-POLYMYXIN-PRAMOXINE 1 % EX CREA: TOPICAL_CREAM | Freq: Two times a day (BID) | CUTANEOUS | 0 refills | Status: DC

## 2018-03-18 MED ORDER — TRAMADOL HCL 50 MG PO TABS
50.0000 mg | ORAL_TABLET | Freq: Once | ORAL | Status: AC
  Administered 2018-03-18: 50 mg via ORAL
  Filled 2018-03-18: qty 1

## 2018-03-18 MED ORDER — HYDROXYZINE HCL 50 MG PO TABS
50.0000 mg | ORAL_TABLET | Freq: Once | ORAL | Status: AC
  Administered 2018-03-18: 50 mg via ORAL
  Filled 2018-03-18: qty 1

## 2018-03-18 MED ORDER — HYDROXYZINE HCL 25 MG PO TABS: 25.0000 mg | ORAL_TABLET | Freq: Three times a day (TID) | ORAL | 0 refills | Status: DC | PRN

## 2018-03-18 MED ORDER — BACITRACIN-NEOMYCIN-POLYMYXIN 400-5-5000 EX OINT
TOPICAL_OINTMENT | Freq: Once | CUTANEOUS | Status: AC
  Administered 2018-03-18: 1 via TOPICAL
  Filled 2018-03-18: qty 1

## 2018-03-18 MED ORDER — SULFAMETHOXAZOLE-TRIMETHOPRIM 800-160 MG PO TABS: 1.0000 | ORAL_TABLET | Freq: Two times a day (BID) | ORAL | 0 refills | Status: DC

## 2018-03-18 MED ORDER — SULFAMETHOXAZOLE-TRIMETHOPRIM 800-160 MG PO TABS
1.0000 | ORAL_TABLET | Freq: Once | ORAL | Status: AC
  Administered 2018-03-18: 1 via ORAL
  Filled 2018-03-18: qty 1

## 2018-03-18 NOTE — ED Provider Notes (Addendum)
[facility] Emergency Department Provider Note   ____________________________________________   First MD Initiated Contact with Patient 03/18/18 2001     (approximate)  I have reviewed the triage vital signs and the nursing notes.   HISTORY  Chief Complaint Blister    HPI Kimberly Hartman is a 67 y.o. female patient presents for blister to the right medial ankle.  Patient states she is working in the yard yesterday.  Patient today when she went to bed she knows that it was itching at the site.  Patient awakened this morning with a small blister which has increased in size prior to her arrival.  Patient state pain with ambulation.  Patient also state redness spreading from the site.  Patient rates the pain as 6/10.  Patient described the pain is "aching".  Patient took Benadryl with no relief.   Past Medical History:  Diagnosis Date  . Diabetes mellitus without complication ([place])   . Thyroid disease     There are no active problems to display for this patient.   Past Surgical History:  Procedure Laterality Date  . ABDOMINAL HYSTERECTOMY    . ABDOMINAL SURGERY    . BREAST EXCISIONAL BIOPSY Right    benign  . DENTAL SURGERY    . EYE SURGERY    . KNEE ARTHROSCOPY WITH MENISCAL REPAIR Right     Prior to Admission medications   Medication Sig Start Date End Date Taking? Authorizing Provider  cyclobenzaprine (FLEXERIL) 10 MG tablet Take 0.5 tablets (5 mg total) by mouth 3 (three) times daily as needed for muscle spasms. 08/17/16   Triplett, Johnette Abraham B, FNP  hydrOXYzine (ATARAX/VISTARIL) 25 MG tablet Take 1 tablet (25 mg total) by mouth 3 (three) times daily as needed. 03/18/18   Sable Feil, PA-C  meloxicam (MOBIC) 15 MG tablet Take 1 tablet (15 mg total) by mouth daily. 08/17/16   Triplett, Cari B, FNP  neomycin-polymyxin-pramoxine (NEOSPORIN PLUS) 1 % cream Apply topically 2 (two) times daily. 03/18/18   Sable Feil, PA-C  sulfamethoxazole-trimethoprim  (BACTRIM DS,SEPTRA DS) 800-160 MG tablet Take 1 tablet by mouth 2 (two) times daily. 03/18/18   Sable Feil, PA-C    Allergies Adhesive [tape]; Chantix [varenicline tartrate]; Metformin and related; Penicillins; Tetracyclines & related; and Wellbutrin [bupropion]  No family history on file.  Social History Social History   Tobacco Use  . Smoking status: Current Every Day Smoker  . Smokeless tobacco: Never Used  Substance Use Topics  . Alcohol use: Yes  . Drug use: Never    Review of Systems Constitutional: No fever/chills Eyes: No visual changes. ENT: No sore throat. Cardiovascular: Denies chest pain. Respiratory: Denies shortness of breath. Gastrointestinal: No abdominal pain.  No nausea, no vomiting.  No diarrhea.  No constipation. Genitourinary: Negative for dysuria. Musculoskeletal: Negative for back pain. Skin: Negative for rash. Neurological: Negative for headaches, focal weakness or numbness. Endocrine:Diabetes and hypothyroidism. Allergic/Immunilogical: See medication list. ____________________________________________   PHYSICAL EXAM:  VITAL SIGNS: ED Triage Vitals  Enc Vitals Group     BP 03/18/18 1951 127/63     Pulse Rate 03/18/18 1951 79     Resp 03/18/18 1951 18     Temp 03/18/18 1951 98.2 F (36.8 C)     Temp Source 03/18/18 1951 Oral     SpO2 03/18/18 1951 97 %     Weight 03/18/18 1951 160 lb (72.6 kg)     Height 03/18/18 1951 5\' 3"  (1.6 m)  Head Circumference --      Peak Flow --      Pain Score 03/18/18 1954 6     Pain Loc --      Pain Edu? --      Excl. in [place]? --    Constitutional: Alert and oriented. Well appearing and in no acute distress. Cardiovascular: Normal rate, regular rhythm. Grossly normal heart sounds.  Good peripheral circulation. Respiratory: Normal respiratory effort.  No retractions. Lungs CTAB. Musculoskeletal: No lower extremity tenderness nor edema.  No joint effusions. Neurologic:  Normal speech and language. No  gross focal neurologic deficits are appreciated. No gait instability. Skin:  Skin is warm, dry and intact. No rash noted.  Bullous lesion medial right ankle.  Mild erythema. Psychiatric: Mood and affect are normal. Speech and behavior are normal.  ____________________________________________   LABS (all labs ordered are listed, but only abnormal results are displayed)  Labs Reviewed - No data to display ____________________________________________  EKG   ____________________________________________  RADIOLOGY  ED MD interpretation:    Official radiology report(s): No results found.  ____________________________________________   PROCEDURES  Procedure(s) performed: None  Aspiration of blood/fluid Date/Time: 04/09/2018 8:22 AM Performed by: Sable Feil, PA-C Authorized by: Sable Feil, PA-C  Consent: Verbal consent obtained. Risks and benefits: risks, benefits and alternatives were discussed Consent given by: patient Patient understanding: patient states understanding of the procedure being performed Patient consent: the patient's understanding of the procedure matches consent given Procedure consent: procedure consent matches procedure scheduled Relevant documents: relevant documents present and verified Test results: test results not available Site marked: the operative site was marked Imaging studies: imaging studies not available Patient identity confirmed: verbally with patient Preparation: Patient was prepped and draped in the usual sterile fashion. Local anesthesia used: no  Anesthesia: Local anesthesia used: no  Sedation: Patient sedated: no  Patient tolerance: Patient tolerated the procedure well with no immediate complications     Critical Care performed: No  ____________________________________________   INITIAL IMPRESSION / ASSESSMENT AND PLAN / ED COURSE  As part of my medical decision making, I reviewed the following data within the  electronic MEDICAL RECORD NUMBER    Bullous lesion and cellulitis secondary to insect bite.  Blister was drained under sterile conditions.  Area was reclean and bandaged.  Patient given discharge care instruction advised take medication as directed.  Advised patient follow-up PCP if no improvement in 3 to 5 days.  Return to ED if condition worsens.     ____________________________________________   FINAL CLINICAL IMPRESSION(S) / ED DIAGNOSES  Final diagnoses:  Blister  Cellulitis of right foot     ED Discharge Orders        Ordered    sulfamethoxazole-trimethoprim (BACTRIM DS,SEPTRA DS) 800-160 MG tablet  2 times daily     03/18/18 2028    hydrOXYzine (ATARAX/VISTARIL) 25 MG tablet  3 times daily PRN     03/18/18 2028    neomycin-polymyxin-pramoxine (NEOSPORIN PLUS) 1 % cream  2 times daily     03/18/18 2029       Note:  This document was prepared using Dragon voice recognition software and may include unintentional dictation errors.    Sable Feil, PA-C 03/18/18 2038    Merlyn Lot, MD 03/18/18 2053    Sable Feil, PA-C 04/09/18 3664    Merlyn Lot, MD 04/09/18 [phone]

## 2018-03-18 NOTE — ED Notes (Signed)
Presents with fluid filled blister to right medial foot at the arch. States it started yesterday and has progressively gotten worse. Unsure if she has been bitten by an insect or "what". Patient denies other areas of blisters.

## 2018-03-18 NOTE — ED Triage Notes (Signed)
Patient states that last night she started having itching to her right ankle. Patient states that she woke up this morning with a small blister to her right inner ankle. Patient reports that the blister has become larger throughout the day.

## 2018-03-19 ENCOUNTER — Encounter: Payer: Self-pay | Admitting: Emergency Medicine

## 2018-03-19 ENCOUNTER — Emergency Department
Admission: EM | Admit: 2018-03-19 | Discharge: 2018-03-19 | Disposition: A | Discharge status: Home / Self Care | Payer: Medicare Other | Attending: Emergency Medicine | Admitting: Emergency Medicine

## 2018-03-19 ENCOUNTER — Emergency Department: Payer: Medicare Other

## 2018-03-19 ENCOUNTER — Other Ambulatory Visit: Payer: Self-pay

## 2018-03-19 DIAGNOSIS — F17200 Nicotine dependence, unspecified, uncomplicated: Secondary | ICD-10-CM | POA: Diagnosis not present

## 2018-03-19 DIAGNOSIS — L244 Irritant contact dermatitis due to drugs in contact with skin: Secondary | ICD-10-CM | POA: Insufficient documentation

## 2018-03-19 DIAGNOSIS — E119 Type 2 diabetes mellitus without complications: Secondary | ICD-10-CM | POA: Diagnosis not present

## 2018-03-19 DIAGNOSIS — L03115 Cellulitis of right lower limb: Secondary | ICD-10-CM | POA: Diagnosis not present

## 2018-03-19 DIAGNOSIS — Z79899 Other long term (current) drug therapy: Secondary | ICD-10-CM | POA: Diagnosis not present

## 2018-03-19 DIAGNOSIS — L989 Disorder of the skin and subcutaneous tissue, unspecified: Secondary | ICD-10-CM | POA: Diagnosis present

## 2018-03-19 DIAGNOSIS — L539 Erythematous condition, unspecified: Secondary | ICD-10-CM | POA: Diagnosis not present

## 2018-03-19 LAB — CBC WITH DIFFERENTIAL/PLATELET
Basophils Absolute: 0.1 10*3/uL (ref 0–0.1)
Basophils Relative: 1 %
Eosinophils Absolute: 0.3 10*3/uL (ref 0–0.7)
Eosinophils Relative: 3 %
HEMATOCRIT: 39.6 % (ref 35.0–47.0)
Hemoglobin: 13.5 g/dL (ref 12.0–16.0)
LYMPHS PCT: 32 %
Lymphs Abs: 3.5 10*3/uL (ref 1.0–3.6)
MCH: 28.5 pg (ref 26.0–34.0)
MCHC: 34 g/dL (ref 32.0–36.0)
MCV: 83.7 fL (ref 80.0–100.0)
MONO ABS: 0.7 10*3/uL (ref 0.2–0.9)
Monocytes Relative: 6 %
NEUTROS ABS: 6.4 10*3/uL (ref 1.4–6.5)
NEUTROS PCT: 58 %
Platelets: 310 10*3/uL (ref 150–440)
RBC: 4.73 MIL/uL (ref 3.80–5.20)
RDW: 14 % (ref 11.5–14.5)
WBC: 11 10*3/uL (ref 3.6–11.0)

## 2018-03-19 LAB — COMPREHENSIVE METABOLIC PANEL
ALBUMIN: 3.9 g/dL (ref 3.5–5.0)
ALT: 17 U/L (ref 14–54)
ANION GAP: 9 (ref 5–15)
AST: 19 U/L (ref 15–41)
Alkaline Phosphatase: 69 U/L (ref 38–126)
BUN: 20 mg/dL (ref 6–20)
CHLORIDE: 103 mmol/L (ref 101–111)
CO2: 24 mmol/L (ref 22–32)
Calcium: 8.9 mg/dL (ref 8.9–10.3)
Creatinine, Ser: 0.82 mg/dL (ref 0.44–1.00)
GFR calc Af Amer: >60 mL/min (ref >60)
GFR calc non Af Amer: >60 mL/min (ref >60)
GLUCOSE: 105 mg/dL — AB (ref 65–99)
POTASSIUM: 4.2 mmol/L (ref 3.5–5.1)
SODIUM: 136 mmol/L (ref 135–145)
Total Bilirubin: 0.6 mg/dL (ref 0.3–1.2)
Total Protein: 7.3 g/dL (ref 6.5–8.1)

## 2018-03-19 LAB — LACTIC ACID, PLASMA: LACTIC ACID, VENOUS: 0.7 mmol/L (ref 0.5–1.9)

## 2018-03-19 MED ORDER — CEPHALEXIN 500 MG PO CAPS
500.0000 mg | ORAL_CAPSULE | Freq: Once | ORAL | Status: AC
  Administered 2018-03-19: 500 mg via ORAL
  Filled 2018-03-19: qty 1

## 2018-03-19 MED ORDER — IOHEXOL 300 MG/ML  SOLN
100.0000 mL | Freq: Once | INTRAMUSCULAR | Status: AC | PRN
  Administered 2018-03-19: 100 mL via INTRAVENOUS
  Filled 2018-03-19: qty 100

## 2018-03-19 MED ORDER — CEPHALEXIN 500 MG PO CAPS: 500.0000 mg | ORAL_CAPSULE | Freq: Four times a day (QID) | ORAL | 0 refills | Status: AC

## 2018-03-19 NOTE — ED Provider Notes (Signed)
[facility] Emergency Department Provider Note  ____________________________________________   First MD Initiated Contact with Patient 03/19/18 1419     (approximate)  I have reviewed the triage vital signs and the nursing notes.   HISTORY  Chief Complaint Wound Check   HPI Kimberly Hartman is a 67 y.o. female who self presents to the emergency department for evaluation of a lesion to her right medial ankle.  She initially noted a blister erythema and warmth on the medial aspect of her right ankle after having a "bug bite" several days ago.  She was seen in our emergency department yesterday and was prescribed Bactrim and was advised to put Neosporin cream on the wound.  When she changed her dressing today she noted an additional blister as well as worsening redness.  No fevers or chills.  She does have moderate severity pain worse when touching the wound and improved with rest.  No history of diabetes mellitus.  Able to ambulate without difficulty.  Past Medical History:  Diagnosis Date  . Diabetes mellitus without complication ([place])   . Thyroid disease     There are no active problems to display for this patient.   Past Surgical History:  Procedure Laterality Date  . ABDOMINAL HYSTERECTOMY    . ABDOMINAL SURGERY    . BREAST EXCISIONAL BIOPSY Right    benign  . DENTAL SURGERY    . EYE SURGERY    . KNEE ARTHROSCOPY WITH MENISCAL REPAIR Right     Prior to Admission medications   Medication Sig Start Date End Date Taking? Authorizing Provider  cephALEXin (KEFLEX) 500 MG capsule Take 1 capsule (500 mg total) by mouth 4 (four) times daily for 10 days. 03/19/18 03/29/18  Darel Hong, MD  cyclobenzaprine (FLEXERIL) 10 MG tablet Take 0.5 tablets (5 mg total) by mouth 3 (three) times daily as needed for muscle spasms. 08/17/16   Triplett, Johnette Abraham B, FNP  hydrOXYzine (ATARAX/VISTARIL) 25 MG tablet Take 1 tablet (25 mg total) by mouth 3 (three) times daily as  needed. 03/18/18   Sable Feil, PA-C  meloxicam (MOBIC) 15 MG tablet Take 1 tablet (15 mg total) by mouth daily. 08/17/16   Triplett, Cari B, FNP  neomycin-polymyxin-pramoxine (NEOSPORIN PLUS) 1 % cream Apply topically 2 (two) times daily. 03/18/18   Sable Feil, PA-C  sulfamethoxazole-trimethoprim (BACTRIM DS,SEPTRA DS) 800-160 MG tablet Take 1 tablet by mouth 2 (two) times daily. 03/18/18   Sable Feil, PA-C    Allergies Adhesive [tape]; Chantix [varenicline tartrate]; Metformin and related; Penicillins; Tetracyclines & related; and Wellbutrin [bupropion]  No family history on file.  Social History Social History   Tobacco Use  . Smoking status: Current Every Day Smoker  . Smokeless tobacco: Never Used  Substance Use Topics  . Alcohol use: Yes  . Drug use: Never    Review of Systems Constitutional: No fever/chills ENT: No sore throat. Cardiovascular: Denies chest pain. Respiratory: Denies shortness of breath. Gastrointestinal: No abdominal pain.  No nausea, no vomiting.  No diarrhea.  No constipation. GU: Negative for dysuria Musculoskeletal: Negative for back pain. Neurological: Negative for headaches Skin: Positive for wound Psychiatric: Denies suicidal ideation  ____________________________________________   PHYSICAL EXAM:  VITAL SIGNS: ED Triage Vitals [03/19/18 1313]  Enc Vitals Group     BP      Pulse      Resp      Temp      Temp src      SpO2  Weight 160 lb (72.6 kg)     Height 5\' 3"  (1.6 m)     Head Circumference      Peak Flow      Pain Score 0     Pain Loc      Pain Edu?      Excl. in [place]?     Constitutional: Alert and oriented x4 pleasant cooperative speaks full clear sentences no diaphoresis Head: Atraumatic. Nose: No congestion/rhinnorhea. Mouth/Throat: No trismus Neck: No stridor.   Cardiovascular: Regular rate and rhythm Respiratory: Normal respiratory effort.  No retractions.  Clear to auscultation  bilaterally Gastrointestinal: Soft nontender MSK: 2+ dorsalis pedis pulse in the right foot.  Neurovascularly intact Neurologic:  Normal speech and language. No gross focal neurologic deficits are appreciated.  Skin: Wound to medial aspect of right ankle.  Distal blister is hemorrhagic proximal blister appears serous.  Surrounding 4 cm area of deep dark erythema.  No crepitus.  No induration    ____________________________________________  LABS (all labs ordered are listed, but only abnormal results are displayed)  Labs Reviewed  COMPREHENSIVE METABOLIC PANEL - Abnormal; Notable for the following components:      Result Value   Glucose, Bld 105 (*)    All other components within normal limits  LACTIC ACID, PLASMA  CBC WITH DIFFERENTIAL/PLATELET    Lab work reviewed by me with normal sodium and normal white count not suggestive of necrotizing fasciitis __________________________________________  EKG   ____________________________________________  RADIOLOGY  CT of the tib-fib with IV contrast with no evidence of osteomyelitis or deep space infection ____________________________________________   DIFFERENTIAL includes but not limited to  Necrotizing soft tissue infection, pyomyositis, cellulitis, contact dermatitis, osteomyelitis   PROCEDURES  Procedure(s) performed: o  Procedures  Critical Care performed: no  Observation: no ____________________________________________   INITIAL IMPRESSION / ASSESSMENT AND PLAN / ED COURSE  Pertinent labs & imaging results that were available during my care of the patient were reviewed by me and considered in my medical decision making (see chart for details).  The patient arrives hemodynamically stable and relatively well-appearing although with a clearly worsening lesion to her medial right ankle.  1 of the blisters has become hemorrhagic and it has a new deep overlying erythema.  She has strong distal pulses.  I do have some  concern for possible necrotizing soft tissue infection however the wound has been slowly growing over 2 days.  The other differential would be simply contact dermatitis secondary to Neosporin.  I do think is most prudent to obtain a CT of the leg to evaluate for gas or deep space infection and the patient agrees.  I appreciate that the patient reports a penicillin allergy however she says she has had cephalosporins in the past so I will broaden her coverage from just Bactrim to Bactrim and Keflex.  Fortunately the patient CT scan is reassuring.  She has no suggestive of necrotizing soft tissue infection.  I have advised her to stop using Neosporin as she very well may have a contact dermatitis which is quite common with this medication.  Bactrim and Keflex for 10 days and 2-day wound check.  The patient verbalized understanding agree with the plan.      ____________________________________________   FINAL CLINICAL IMPRESSION(S) / ED DIAGNOSES  Final diagnoses:  Irritant contact dermatitis due to drug in contact with skin  Cellulitis of right lower extremity      NEW MEDICATIONS STARTED DURING THIS VISIT:  Discharge Medication List as of 03/19/2018  4:02 PM    START taking these medications   Details  cephALEXin (KEFLEX) 500 MG capsule Take 1 capsule (500 mg total) by mouth 4 (four) times daily for 10 days., Starting Mon 03/19/2018, Until Thu 03/29/2018, Print         Note:  This document was prepared using Dragon voice recognition software and may include unintentional dictation errors.      Darel Hong, MD 03/20/18 [id number]

## 2018-03-19 NOTE — Discharge Instructions (Signed)
Please stop using Neosporin as I think a large portion of your rash is related to irritation from this drug.  Please continue taking your Bactrim but add on Keflex as prescribed and have a wound check in 2 days.  Return to the emergency department sooner for any new or worsening symptoms such as fevers, worsening pain, difficulty ambulating, or for any other issues whatsoever.  It was a pleasure to take care of you today, and thank you for coming to our emergency department.  If you have any questions or concerns before leaving please ask the nurse to grab me and I'm more than happy to go through your aftercare instructions again.  If you were prescribed any opioid pain medication today such as Norco, Vicodin, Percocet, morphine, hydrocodone, or oxycodone please make sure you do not drive when you are taking this medication as it can alter your ability to drive safely.  If you have any concerns once you are home that you are not improving or are in fact getting worse before you can make it to your follow-up appointment, please do not hesitate to call 911 and come back for further evaluation.  Darel Hong, MD  Results for orders placed or performed during the hospital encounter of 03/19/18  Lactic acid, plasma  Result Value Ref Range   Lactic Acid, Venous 0.7 0.5 - 1.9 mmol/L  Comprehensive metabolic panel  Result Value Ref Range   Sodium 136 135 - 145 mmol/L   Potassium 4.2 3.5 - 5.1 mmol/L   Chloride 103 101 - 111 mmol/L   CO2 24 22 - 32 mmol/L   Glucose, Bld 105 (H) 65 - 99 mg/dL   BUN 20 6 - 20 mg/dL   Creatinine, Ser 0.82 0.44 - 1.00 mg/dL   Calcium 8.9 8.9 - 10.3 mg/dL   Total Protein 7.3 6.5 - 8.1 g/dL   Albumin 3.9 3.5 - 5.0 g/dL   AST 19 15 - 41 U/L   ALT 17 14 - 54 U/L   Alkaline Phosphatase 69 38 - 126 U/L   Total Bilirubin 0.6 0.3 - 1.2 mg/dL   GFR calc non Af Amer >60 >60 mL/min   GFR calc Af Amer >60 >60 mL/min   Anion gap 9 5 - 15  CBC with Differential  Result  Value Ref Range   WBC 11.0 3.6 - 11.0 K/uL   RBC 4.73 3.80 - 5.20 MIL/uL   Hemoglobin 13.5 12.0 - 16.0 g/dL   HCT 39.6 35.0 - 47.0 %   MCV 83.7 80.0 - 100.0 fL   MCH 28.5 26.0 - 34.0 pg   MCHC 34.0 32.0 - 36.0 g/dL   RDW 14.0 11.5 - 14.5 %   Platelets 310 150 - 440 K/uL   Neutrophils Relative % 58 %   Neutro Abs 6.4 1.4 - 6.5 K/uL   Lymphocytes Relative 32 %   Lymphs Abs 3.5 1.0 - 3.6 K/uL   Monocytes Relative 6 %   Monocytes Absolute 0.7 0.2 - 0.9 K/uL   Eosinophils Relative 3 %   Eosinophils Absolute 0.3 0 - 0.7 K/uL   Basophils Relative 1 %   Basophils Absolute 0.1 0 - 0.1 K/uL   Ct Tibia Fibula Right W Contrast  Result Date: 03/19/2018 CLINICAL DATA:  Right lower extremity wound erythema. EXAM: CT OF THE LOWER RIGHT EXTREMITY WITH CONTRAST TECHNIQUE: Multidetector CT imaging of the lower right extremity was performed according to the standard protocol following intravenous contrast administration. COMPARISON:  None. CONTRAST:  161mL OMNIPAQUE IOHEXOL 300 MG/ML  SOLN FINDINGS: Bones/Joint/Cartilage No cortical destruction or osteolysis. No acute fracture or dislocation. Mild medial compartment joint space narrowing of the knee. No tibiotalar joint effusion. Os trigonum. Small bone islands in the talar and fibular heads. Ligaments Suboptimally assessed by CT. Muscles and Tendons Unremarkable. Small amount of fluid in the Gruberi bursa. Soft tissues Mild skin thickening and soft tissue swelling along the medial ankle with a few skin blisters. No abscess or subcutaneous emphysema. No soft tissue mass. IMPRESSION: 1. Soft tissue swelling and skin blistering along the medial ankle, which can be seen with cellulitis. No abscess or subcutaneous emphysema. Electronically Signed   By: Titus Dubin M.D.   On: 03/19/2018 15:36

## 2018-03-19 NOTE — ED Triage Notes (Signed)
Patient presents to the ED with redness around a wound that has increased since yesterday.  Patient was seen yesterday for a blister and was told to come back if redness increased.  Patient states, "now there is 2 blisters and a lot more redness."  Patient ambulatory, no obvious distress, afebrile.

## 2018-03-19 NOTE — ED Notes (Signed)
Pt returns with itching and redness, blistering to right ankle. Pt states she was apparently bitten on Saturday, and that she came last night to have it drained; was told to return if redness got worse. Pt states she now has 2 blisters instead of 1, and that the redness has started to "creep up" her leg, and she thinks another blister is starting. Pt states it itches, and that she has squeezing pain above the wound. Pt alert & oriented with NAD noted.

## 2018-03-22 DIAGNOSIS — L03115 Cellulitis of right lower limb: Secondary | ICD-10-CM | POA: Diagnosis not present

## 2018-03-22 DIAGNOSIS — S80861D Insect bite (nonvenomous), right lower leg, subsequent encounter: Secondary | ICD-10-CM | POA: Diagnosis not present

## 2018-03-22 DIAGNOSIS — W57XXXD Bitten or stung by nonvenomous insect and other nonvenomous arthropods, subsequent encounter: Secondary | ICD-10-CM | POA: Diagnosis not present

## 2018-03-29 DIAGNOSIS — Z961 Presence of intraocular lens: Secondary | ICD-10-CM | POA: Diagnosis not present

## 2018-03-29 DIAGNOSIS — E113291 Type 2 diabetes mellitus with mild nonproliferative diabetic retinopathy without macular edema, right eye: Secondary | ICD-10-CM | POA: Diagnosis not present

## 2018-03-29 DIAGNOSIS — H52203 Unspecified astigmatism, bilateral: Secondary | ICD-10-CM | POA: Diagnosis not present

## 2018-03-29 DIAGNOSIS — H1859 Other hereditary corneal dystrophies: Secondary | ICD-10-CM | POA: Diagnosis not present

## 2018-03-29 LAB — HM DIABETES EYE EXAM

## 2018-04-02 ENCOUNTER — Encounter: Payer: Self-pay | Admitting: Family Medicine

## 2018-04-06 ENCOUNTER — Encounter: Payer: Self-pay | Admitting: Family Medicine

## 2018-04-06 ENCOUNTER — Ambulatory Visit (INDEPENDENT_AMBULATORY_CARE_PROVIDER_SITE_OTHER): Payer: Medicare Other | Admitting: Family Medicine

## 2018-04-06 ENCOUNTER — Telehealth: Payer: Self-pay | Admitting: *Deleted

## 2018-04-06 VITALS — BP 128/58 | HR 65 | Temp 97.4°F | Ht 63.5 in | Wt 163.8 lb

## 2018-04-06 DIAGNOSIS — L920 Granuloma annulare: Secondary | ICD-10-CM | POA: Diagnosis not present

## 2018-04-06 DIAGNOSIS — E039 Hypothyroidism, unspecified: Secondary | ICD-10-CM

## 2018-04-06 DIAGNOSIS — Z7689 Persons encountering health services in other specified circumstances: Secondary | ICD-10-CM

## 2018-04-06 DIAGNOSIS — E119 Type 2 diabetes mellitus without complications: Secondary | ICD-10-CM | POA: Diagnosis not present

## 2018-04-06 LAB — HEMOGLOBIN A1C: HEMOGLOBIN A1C: 7.1 % — AB (ref 4.6–6.5)

## 2018-04-06 LAB — TSH: TSH: 3.25 u[IU]/mL (ref 0.35–4.50)

## 2018-04-06 MED ORDER — CLONAZEPAM 0.5 MG PO TABS: 0.2500 mg | ORAL_TABLET | Freq: Every day | ORAL | 1 refills | Status: DC | PRN

## 2018-04-06 NOTE — Progress Notes (Signed)
   Subjective:    Patient ID: Kimberly Hartman, female    DOB: [birth year], 67 y.o.   MRN: [id number]  HPI This is a 67 yo female who presents today to establish care. She is married. 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  She plays golf, knits, watches TV, has old dog.  Previously [facility].   Last CPE- 1/19 Mammo- 01/23/18 Pap- hysterectomy, ? total Colonoscopy- yes, unknown date Tdap- unknown Flu-annual Eye- 03/29/18- background diabetic retinophathy Dental- yes Exercise- not regular Sleep- sleeps well with daily clonazepam  Bug bites- is very sensitive to bug bites. Has been using an all natural bug repellant.   DM- last hgba1c 1/19- 6.9. On Januvia and Actos. Can't tolerate metformin- immediate GI diarrhea. Tried Glimeperide- had hypoglycemia. Occasionally checks blood sugar at home, needs new strips. Runs high fasting am, lower as day goes on. She is interested in different treatment, but can't remember everything she has been on before.   TSH- not tolerating heat, increased flushing. Last TSH 1/19 1.89  Granuloma annulare- on feet, requests dermatology referral  Denies chest pain, SOB, abdominal pain, dysuria, hematuria.   Past Medical History:  Diagnosis Date  . Diabetes mellitus without complication ([place])   . Thyroid disease    Past Surgical History:  Procedure Laterality Date  . ABDOMINAL HYSTERECTOMY    . ABDOMINAL SURGERY    . BREAST EXCISIONAL BIOPSY Right    benign  . DENTAL SURGERY    . EYE SURGERY    . KNEE ARTHROSCOPY WITH MENISCAL REPAIR Right    No family history on file. Social History   Tobacco Use  . Smoking status: Current Every Day Smoker  . Smokeless tobacco: Never Used  Substance Use Topics  . Alcohol use: Yes  . Drug use: Never        Review of Systems    per HPI Objective:   Physical Exam Physical Exam  Constitutional: Oriented to person, place, and time. She appears well-developed and well-nourished.  HENT:  Head:  Normocephalic and atraumatic.  Eyes: Conjunctivae are normal.  Neck: Normal range of motion. Neck supple.  Cardiovascular: Normal rate, regular rhythm and normal heart sounds.   Pulmonary/Chest: Effort normal and breath sounds normal.  Musculoskeletal: No edema.  Neurological: Alert and oriented to person, place, and time.  Skin: Skin is warm and dry.  Psychiatric: Normal mood and affect. Behavior is normal. Judgment and thought content normal.  Vitals reviewed.     BP (!) 128/58 (BP Location: Left Arm, Patient Position: Sitting, Cuff Size: Normal)   Pulse 65   Temp (!) 97.4 F (36.3 C) (Oral)   Ht 5' 3.5" (1.613 m)   Wt 163 lb 12 oz (74.3 kg)   SpO2 97%   BMI 28.55 kg/m      Assessment & Plan:  1. Encounter to establish care - will request records from prior PCP  2. Hypothyroidism, unspecified type - TSH  3. Type 2 diabetes mellitus without complication, without long-term current use of insulin (HCC) - Hemoglobin A1c  4. Granuloma annulare - Ambulatory referral to Dermatology  - follow up in 6 months for CPE  Clarene Reamer, FNP-BC  [facility]  04/10/2018 4:26 PM

## 2018-04-06 NOTE — Patient Instructions (Addendum)
  Good to meet you today  I will notify you of lab results via Mychart  Follow up in 6 months for follow up  Insect Bite, Adult An insect bite can make your skin red, itchy, and swollen. Some insects can spread disease to people with a bite. However, most insect bites do not lead to disease, and most are not serious. Follow these instructions at home: Bite area care  Do not scratch the bite area.  Keep the bite area clean and dry.  Wash the bite area every day with soap and water as told by your doctor.  Check the bite area every day for signs of infection. Check for: ? More redness, swelling, or pain. ? Fluid or blood. ? Warmth. ? Pus. Managing pain, itching, and swelling  You may put any of these on the bite area as told by your doctor: ? A baking soda paste. ? Cortisone cream. ? Calamine lotion.  If directed, put ice on the bite area. ? Put ice in a plastic bag. ? Place a towel between your skin and the bag. ? Leave the ice on for 20 minutes, 2-3 times a day. Medicines  Take medicines or put medicines on your skin only as told by your doctor.  If you were prescribed an antibiotic medicine, use it as told by your doctor. Do not stop using the antibiotic even if your condition improves. General instructions  Keep all follow-up visits as told by your doctor. This is important. How is this prevented? To help you have a lower risk of insect bites:  When you are outside, wear clothing that covers your arms and legs.  Use insect repellent. The best insect repellents have: ? An active ingredient of DEET, picaridin, oil of lemon eucalyptus (OLE), or IR3535. ? Higher amounts of DEET or another active ingredient than other repellents have.  If your home windows do not have screens, think about putting some in.  Contact a doctor if:  You have more redness, swelling, or pain in the bite area.  You have fluid, blood, or pus coming from the bite area.  The bite area  feels warm.  You have a fever. Get help right away if:  You have joint pain.  You have a rash.  You have shortness of breath.  You feel more tired or sleepy than you normally do.  You have neck pain.  You have a headache.  You feel weaker than you normally do.  You have chest pain.  You have pain in your belly.  You feel sick to your stomach (nauseous) or you throw up (vomit). Summary  An insect bite can make your skin red, itchy, and swollen.  Do not scratch the bite area, and keep it clean and dry.  Ice can help with pain and itching from the bite. This information is not intended to replace advice given to you by your health care provider. Make sure you discuss any questions you have with your health care provider. Document Released: 09/16/2000 Document Revised: 04/21/2016 Document Reviewed: 02/04/2015 Elsevier Interactive Patient Education  2018 [employer].

## 2018-04-06 NOTE — Telephone Encounter (Signed)
Pt does want referral to derm. In [place], please put referral in and I advise our [facility] will call to schedule appt

## 2018-04-06 NOTE — Telephone Encounter (Signed)
Noted  

## 2018-04-07 ENCOUNTER — Encounter: Payer: Self-pay | Admitting: Family Medicine

## 2018-04-09 ENCOUNTER — Other Ambulatory Visit: Payer: Self-pay | Admitting: Family Medicine

## 2018-04-09 DIAGNOSIS — E119 Type 2 diabetes mellitus without complications: Secondary | ICD-10-CM

## 2018-04-09 DIAGNOSIS — L309 Dermatitis, unspecified: Secondary | ICD-10-CM

## 2018-04-09 MED ORDER — GLUCOSE BLOOD VI STRP: ORAL_STRIP | 3 refills | Status: DC

## 2018-04-10 ENCOUNTER — Encounter: Payer: Self-pay | Admitting: Family Medicine

## 2018-04-10 DIAGNOSIS — E039 Hypothyroidism, unspecified: Secondary | ICD-10-CM | POA: Insufficient documentation

## 2018-04-10 DIAGNOSIS — E119 Type 2 diabetes mellitus without complications: Secondary | ICD-10-CM | POA: Insufficient documentation

## 2018-04-10 DIAGNOSIS — E118 Type 2 diabetes mellitus with unspecified complications: Secondary | ICD-10-CM | POA: Insufficient documentation

## 2018-04-25 ENCOUNTER — Encounter: Payer: Self-pay | Admitting: Family Medicine

## 2018-04-25 ENCOUNTER — Other Ambulatory Visit: Payer: Self-pay | Admitting: Emergency Medicine

## 2018-04-25 DIAGNOSIS — E119 Type 2 diabetes mellitus without complications: Secondary | ICD-10-CM

## 2018-04-26 ENCOUNTER — Other Ambulatory Visit: Payer: Self-pay | Admitting: Family Medicine

## 2018-04-26 DIAGNOSIS — E119 Type 2 diabetes mellitus without complications: Secondary | ICD-10-CM

## 2018-04-26 MED ORDER — GLUCOSE BLOOD VI STRP: ORAL_STRIP | 3 refills | Status: DC

## 2018-04-26 NOTE — Telephone Encounter (Signed)
Please provide instructions. Use as directed isn't enough information

## 2018-04-30 ENCOUNTER — Other Ambulatory Visit: Payer: Self-pay | Admitting: Family Medicine

## 2018-04-30 DIAGNOSIS — E119 Type 2 diabetes mellitus without complications: Secondary | ICD-10-CM

## 2018-04-30 MED ORDER — GLUCOSE BLOOD VI STRP: ORAL_STRIP | 3 refills | Status: DC

## 2018-04-30 NOTE — Telephone Encounter (Signed)
Copied from [place] [id number]. Topic: Quick Communication - See Telephone Encounter >> Apr 30, 2018  8:18 AM Mylinda Latina, NT wrote: CRM for notification. See Telephone encounter for: 04/30/18. Doly calling from [facility] states that the insurance company will only pay for a once a day frequency of her Glucose. Not 2x a day She is wondering if you can send over a new RX with the once a day frequency on it for the patient  glucose blood (ONE TOUCH ULTRA TEST) test strip   [id number] [place], [place] [address] [place] (Phone) [phone] (Fax)

## 2018-04-30 NOTE — Telephone Encounter (Signed)
Okay to change the prescription to once a day

## 2018-04-30 NOTE — Telephone Encounter (Signed)
Glenda Chroman FNP is out of office this week; on med list has ck BS twice a day and as needed b Glenda Chroman FNP. Please advise.

## 2018-04-30 NOTE — Telephone Encounter (Signed)
[facility] will need [facility] new prescription sent for the test strips with checking blood sugar once daily instead of 2x day prn. Insurance will only pay for once daily frequency.

## 2018-05-03 DIAGNOSIS — D229 Melanocytic nevi, unspecified: Secondary | ICD-10-CM | POA: Diagnosis not present

## 2018-05-03 DIAGNOSIS — Z1283 Encounter for screening for malignant neoplasm of skin: Secondary | ICD-10-CM | POA: Diagnosis not present

## 2018-05-03 DIAGNOSIS — L304 Erythema intertrigo: Secondary | ICD-10-CM | POA: Diagnosis not present

## 2018-05-03 DIAGNOSIS — L821 Other seborrheic keratosis: Secondary | ICD-10-CM | POA: Diagnosis not present

## 2018-05-03 DIAGNOSIS — D1800 Hemangioma unspecified site: Secondary | ICD-10-CM | POA: Diagnosis not present

## 2018-05-03 DIAGNOSIS — L812 Freckles: Secondary | ICD-10-CM | POA: Diagnosis not present

## 2018-05-03 DIAGNOSIS — L400 Psoriasis vulgaris: Secondary | ICD-10-CM | POA: Diagnosis not present

## 2018-05-07 ENCOUNTER — Encounter: Payer: Self-pay | Admitting: Family Medicine

## 2018-05-11 ENCOUNTER — Telehealth: Payer: Self-pay | Admitting: Family Medicine

## 2018-05-11 NOTE — Telephone Encounter (Signed)
I spoke with pt. She does not want to receive any EMMI calls. Can you remove from the system?  Copied from [place] [id number]. Topic: EmmiPrevent >> May 11, 2018 10:35 AM Synthia Innocent wrote: Reason for CRM: Requesting to be removed from [facility] list.

## 2018-05-14 DIAGNOSIS — L400 Psoriasis vulgaris: Secondary | ICD-10-CM | POA: Diagnosis not present

## 2018-05-17 DIAGNOSIS — L400 Psoriasis vulgaris: Secondary | ICD-10-CM | POA: Diagnosis not present

## 2018-05-21 DIAGNOSIS — L400 Psoriasis vulgaris: Secondary | ICD-10-CM | POA: Diagnosis not present

## 2018-05-24 DIAGNOSIS — L400 Psoriasis vulgaris: Secondary | ICD-10-CM | POA: Diagnosis not present

## 2018-05-28 DIAGNOSIS — L400 Psoriasis vulgaris: Secondary | ICD-10-CM | POA: Diagnosis not present

## 2018-05-31 DIAGNOSIS — L400 Psoriasis vulgaris: Secondary | ICD-10-CM | POA: Diagnosis not present

## 2018-06-05 DIAGNOSIS — L400 Psoriasis vulgaris: Secondary | ICD-10-CM | POA: Diagnosis not present

## 2018-06-07 DIAGNOSIS — M25512 Pain in left shoulder: Secondary | ICD-10-CM | POA: Diagnosis not present

## 2018-06-07 DIAGNOSIS — L400 Psoriasis vulgaris: Secondary | ICD-10-CM | POA: Diagnosis not present

## 2018-06-11 DIAGNOSIS — L400 Psoriasis vulgaris: Secondary | ICD-10-CM | POA: Diagnosis not present

## 2018-06-14 DIAGNOSIS — L400 Psoriasis vulgaris: Secondary | ICD-10-CM | POA: Diagnosis not present

## 2018-06-17 DIAGNOSIS — Z23 Encounter for immunization: Secondary | ICD-10-CM | POA: Diagnosis not present

## 2018-06-17 DIAGNOSIS — Z111 Encounter for screening for respiratory tuberculosis: Secondary | ICD-10-CM | POA: Diagnosis not present

## 2018-06-18 DIAGNOSIS — L400 Psoriasis vulgaris: Secondary | ICD-10-CM | POA: Diagnosis not present

## 2018-06-19 DIAGNOSIS — Z111 Encounter for screening for respiratory tuberculosis: Secondary | ICD-10-CM | POA: Diagnosis not present

## 2018-06-21 DIAGNOSIS — L400 Psoriasis vulgaris: Secondary | ICD-10-CM | POA: Diagnosis not present

## 2018-07-02 DIAGNOSIS — L400 Psoriasis vulgaris: Secondary | ICD-10-CM | POA: Diagnosis not present

## 2018-07-05 ENCOUNTER — Encounter: Payer: Self-pay | Admitting: Family Medicine

## 2018-07-05 DIAGNOSIS — L400 Psoriasis vulgaris: Secondary | ICD-10-CM | POA: Diagnosis not present

## 2018-07-06 ENCOUNTER — Other Ambulatory Visit: Payer: Self-pay | Admitting: Family Medicine

## 2018-07-06 DIAGNOSIS — E119 Type 2 diabetes mellitus without complications: Secondary | ICD-10-CM

## 2018-07-06 DIAGNOSIS — E039 Hypothyroidism, unspecified: Secondary | ICD-10-CM

## 2018-07-06 MED ORDER — ATORVASTATIN CALCIUM 20 MG PO TABS: 20.0000 mg | ORAL_TABLET | Freq: Every day | ORAL | 3 refills | Status: DC

## 2018-07-06 MED ORDER — LEVOTHYROXINE SODIUM 100 MCG PO TABS: 100.0000 ug | ORAL_TABLET | ORAL | 3 refills | Status: DC

## 2018-07-09 ENCOUNTER — Other Ambulatory Visit: Payer: Self-pay | Admitting: Family Medicine

## 2018-07-09 DIAGNOSIS — E119 Type 2 diabetes mellitus without complications: Secondary | ICD-10-CM

## 2018-07-09 DIAGNOSIS — L400 Psoriasis vulgaris: Secondary | ICD-10-CM | POA: Diagnosis not present

## 2018-07-09 MED ORDER — PIOGLITAZONE HCL 15 MG PO TABS: 15.0000 mg | ORAL_TABLET | Freq: Every day | ORAL | 3 refills | Status: DC

## 2018-07-09 NOTE — Progress Notes (Signed)
Refill of pioglitazone sent to mail order pharmacy.

## 2018-07-12 DIAGNOSIS — B353 Tinea pedis: Secondary | ICD-10-CM | POA: Diagnosis not present

## 2018-07-12 DIAGNOSIS — L400 Psoriasis vulgaris: Secondary | ICD-10-CM | POA: Diagnosis not present

## 2018-07-12 DIAGNOSIS — D2372 Other benign neoplasm of skin of left lower limb, including hip: Secondary | ICD-10-CM | POA: Diagnosis not present

## 2018-07-16 DIAGNOSIS — L400 Psoriasis vulgaris: Secondary | ICD-10-CM | POA: Diagnosis not present

## 2018-07-19 DIAGNOSIS — L400 Psoriasis vulgaris: Secondary | ICD-10-CM | POA: Diagnosis not present

## 2018-07-23 DIAGNOSIS — L400 Psoriasis vulgaris: Secondary | ICD-10-CM | POA: Diagnosis not present

## 2018-07-24 DIAGNOSIS — M19012 Primary osteoarthritis, left shoulder: Secondary | ICD-10-CM | POA: Diagnosis not present

## 2018-07-24 DIAGNOSIS — L409 Psoriasis, unspecified: Secondary | ICD-10-CM | POA: Diagnosis not present

## 2018-07-24 DIAGNOSIS — M7551 Bursitis of right shoulder: Secondary | ICD-10-CM | POA: Insufficient documentation

## 2018-07-24 DIAGNOSIS — L400 Psoriasis vulgaris: Secondary | ICD-10-CM | POA: Insufficient documentation

## 2018-07-26 DIAGNOSIS — L400 Psoriasis vulgaris: Secondary | ICD-10-CM | POA: Diagnosis not present

## 2018-07-30 DIAGNOSIS — L400 Psoriasis vulgaris: Secondary | ICD-10-CM | POA: Diagnosis not present

## 2018-08-02 DIAGNOSIS — L400 Psoriasis vulgaris: Secondary | ICD-10-CM | POA: Diagnosis not present

## 2018-08-14 DIAGNOSIS — L400 Psoriasis vulgaris: Secondary | ICD-10-CM | POA: Diagnosis not present

## 2018-08-15 ENCOUNTER — Other Ambulatory Visit: Payer: Self-pay | Admitting: Family Medicine

## 2018-08-15 ENCOUNTER — Encounter: Payer: Self-pay | Admitting: Family Medicine

## 2018-08-15 MED ORDER — JANUVIA 100 MG PO TABS: 100.0000 mg | ORAL_TABLET | Freq: Every day | ORAL | 3 refills | Status: DC

## 2018-08-16 DIAGNOSIS — L400 Psoriasis vulgaris: Secondary | ICD-10-CM | POA: Diagnosis not present

## 2018-08-17 DIAGNOSIS — H43811 Vitreous degeneration, right eye: Secondary | ICD-10-CM | POA: Diagnosis not present

## 2018-08-20 DIAGNOSIS — L400 Psoriasis vulgaris: Secondary | ICD-10-CM | POA: Diagnosis not present

## 2018-08-22 DIAGNOSIS — L400 Psoriasis vulgaris: Secondary | ICD-10-CM | POA: Diagnosis not present

## 2018-08-28 DIAGNOSIS — L400 Psoriasis vulgaris: Secondary | ICD-10-CM | POA: Diagnosis not present

## 2018-09-03 DIAGNOSIS — L400 Psoriasis vulgaris: Secondary | ICD-10-CM | POA: Diagnosis not present

## 2018-09-06 DIAGNOSIS — L400 Psoriasis vulgaris: Secondary | ICD-10-CM | POA: Diagnosis not present

## 2018-09-13 DIAGNOSIS — Z23 Encounter for immunization: Secondary | ICD-10-CM | POA: Diagnosis not present

## 2018-09-19 ENCOUNTER — Other Ambulatory Visit: Payer: Self-pay | Admitting: Family Medicine

## 2018-09-19 DIAGNOSIS — Z79899 Other long term (current) drug therapy: Secondary | ICD-10-CM | POA: Diagnosis not present

## 2018-09-19 DIAGNOSIS — L400 Psoriasis vulgaris: Secondary | ICD-10-CM | POA: Diagnosis not present

## 2018-09-19 NOTE — Telephone Encounter (Signed)
Last filled 04/06/18 #90 refills 1  Last OV 04/06/18

## 2018-10-11 DIAGNOSIS — E669 Obesity, unspecified: Secondary | ICD-10-CM | POA: Diagnosis not present

## 2018-10-11 DIAGNOSIS — M25511 Pain in right shoulder: Secondary | ICD-10-CM | POA: Diagnosis not present

## 2018-10-11 DIAGNOSIS — L920 Granuloma annulare: Secondary | ICD-10-CM | POA: Diagnosis not present

## 2018-10-11 DIAGNOSIS — L409 Psoriasis, unspecified: Secondary | ICD-10-CM | POA: Diagnosis not present

## 2018-10-11 DIAGNOSIS — M2550 Pain in unspecified joint: Secondary | ICD-10-CM | POA: Diagnosis not present

## 2018-10-11 DIAGNOSIS — M150 Primary generalized (osteo)arthritis: Secondary | ICD-10-CM | POA: Diagnosis not present

## 2018-10-11 DIAGNOSIS — M545 Low back pain: Secondary | ICD-10-CM | POA: Diagnosis not present

## 2018-10-11 DIAGNOSIS — M7501 Adhesive capsulitis of right shoulder: Secondary | ICD-10-CM | POA: Diagnosis not present

## 2018-10-11 DIAGNOSIS — Z6830 Body mass index (BMI) 30.0-30.9, adult: Secondary | ICD-10-CM | POA: Diagnosis not present

## 2018-10-21 ENCOUNTER — Other Ambulatory Visit: Payer: Self-pay | Admitting: Family Medicine

## 2018-10-21 DIAGNOSIS — E039 Hypothyroidism, unspecified: Secondary | ICD-10-CM

## 2018-10-21 DIAGNOSIS — Z1159 Encounter for screening for other viral diseases: Secondary | ICD-10-CM

## 2018-10-21 DIAGNOSIS — E119 Type 2 diabetes mellitus without complications: Secondary | ICD-10-CM

## 2018-10-22 ENCOUNTER — Ambulatory Visit (INDEPENDENT_AMBULATORY_CARE_PROVIDER_SITE_OTHER): Payer: Medicare Other

## 2018-10-22 VITALS — BP 124/62 | HR 63 | Temp 97.8°F | Ht 63.5 in | Wt 173.5 lb

## 2018-10-22 DIAGNOSIS — Z0000 Encounter for general adult medical examination without abnormal findings: Secondary | ICD-10-CM

## 2018-10-22 DIAGNOSIS — E119 Type 2 diabetes mellitus without complications: Secondary | ICD-10-CM

## 2018-10-22 DIAGNOSIS — E039 Hypothyroidism, unspecified: Secondary | ICD-10-CM

## 2018-10-22 LAB — MICROALBUMIN / CREATININE URINE RATIO
CREATININE, U: 224.2 mg/dL
Microalb Creat Ratio: 1.4 mg/g (ref 0.0–30.0)
Microalb, Ur: 3.2 mg/dL — ABNORMAL HIGH (ref 0.0–1.9)

## 2018-10-22 LAB — LIPID PANEL
Cholesterol: 139 mg/dL (ref 0–200)
HDL: 41.6 mg/dL (ref >39.00)
LDL Cholesterol: 77 mg/dL (ref 0–99)
NonHDL: 97.12
TRIGLYCERIDES: 103 mg/dL (ref 0.0–149.0)
Total CHOL/HDL Ratio: 3
VLDL: 20.6 mg/dL (ref 0.0–40.0)

## 2018-10-22 LAB — TSH: TSH: 4.45 u[IU]/mL (ref 0.35–4.50)

## 2018-10-22 LAB — HEMOGLOBIN A1C: Hgb A1c MFr Bld: 7.3 % — ABNORMAL HIGH (ref 4.6–6.5)

## 2018-10-22 NOTE — Patient Instructions (Addendum)
Ms. Cataldi , Thank you for taking time to come for your Medicare Wellness Visit. I appreciate your ongoing commitment to your health goals. Please review the following plan we discussed and let me know if I can assist you in the future.   These are the goals we discussed: Goals    . Patient Stated     Starting 10/22/18, I will continue to walk at least 7000 steps 2-3 days per week.        This is a list of the screening recommended for you and due dates:  Health Maintenance  Topic Date Due  . Complete foot exam   11/05/2018*  . Eye exam for diabetics  03/30/2019  . Hemoglobin A1C  04/22/2019  . Urine Protein Check  10/23/2019  . Mammogram  01/24/2020  . Tetanus Vaccine  09/12/2022  . Colon Cancer Screening  11/24/2025  . Flu Shot  Completed  . DEXA scan (bone density measurement)  Completed  .  Hepatitis C: One time screening is recommended by Center for Disease Control  (CDC) for  adults born from [birth year] through 1966.   Completed  . Pneumonia vaccines  Completed  *Topic was postponed. The date shown is not the original due date.   Preventive Care for Adults  A healthy lifestyle and preventive care can promote health and wellness. Preventive health guidelines for adults include the following key practices.  . A routine yearly physical is a good way to check with your health care provider about your health and preventive screening. It is a chance to share any concerns and updates on your health and to receive a thorough exam.  . Visit your dentist for a routine exam and preventive care every 6 months. Brush your teeth twice a day and floss once a day. Good oral hygiene prevents tooth decay and gum disease.  . The frequency of eye exams is based on your age, health, family medical history, use  of contact lenses, and other factors. Follow your health care provider's recommendations for frequency of eye exams.  . Eat a healthy diet. Foods like vegetables, fruits, whole grains, low-fat  dairy products, and lean protein foods contain the nutrients you need without too many calories. Decrease your intake of foods high in solid fats, added sugars, and salt. Eat the right amount of calories for you. Get information about a proper diet from your health care provider, if necessary.  . Regular physical exercise is one of the most important things you can do for your health. Most adults should get at least 150 minutes of moderate-intensity exercise (any activity that increases your heart rate and causes you to sweat) each week. In addition, most adults need muscle-strengthening exercises on 2 or more days a week.  Silver Sneakers may be a benefit available to you. To determine eligibility, you may visit the website: www.silversneakers.com or contact program at [phone] Mon-Fri between 8AM-8PM.   . Maintain a healthy weight. The body mass index (BMI) is a screening tool to identify possible weight problems. It provides an estimate of body fat based on height and weight. Your health care provider can find your BMI and can help you achieve or maintain a healthy weight.   For adults 20 years and older: ? A BMI below 18.5 is considered underweight. ? A BMI of 18.5 to 24.9 is normal. ? A BMI of 25 to 29.9 is considered overweight. ? A BMI of 30 and above is considered obese.   . Maintain  normal blood lipids and cholesterol levels by exercising and minimizing your intake of saturated fat. Eat a balanced diet with plenty of fruit and vegetables. Blood tests for lipids and cholesterol should begin at age 22 and be repeated every 5 years. If your lipid or cholesterol levels are high, you are over 50, or you are at high risk for heart disease, you may need your cholesterol levels checked more frequently. Ongoing high lipid and cholesterol levels should be treated with medicines if diet and exercise are not working.  . If you smoke, find out from your health care provider how to quit. If you do  not use tobacco, please do not start.  . If you choose to drink alcohol, please do not consume more than 2 drinks per day. One drink is considered to be 12 ounces (355 mL) of beer, 5 ounces (148 mL) of wine, or 1.5 ounces (44 mL) of liquor.  . If you are 27-42 years old, ask your health care provider if you should take aspirin to prevent strokes.  . Use sunscreen. Apply sunscreen liberally and repeatedly throughout the day. You should seek shade when your shadow is shorter than you. Protect yourself by wearing long sleeves, pants, a wide-brimmed hat, and sunglasses year round, whenever you are outdoors.  . Once a month, do a whole body skin exam, using a mirror to look at the skin on your back. Tell your health care provider of new moles, moles that have irregular borders, moles that are larger than a pencil eraser, or moles that have changed in shape or color.

## 2018-10-22 NOTE — Progress Notes (Signed)
Subjective:   Kimberly Hartman is a 68 y.o. female who presents for an Initial Medicare Annual Wellness Visit.  Review of Systems    N/A  Cardiac Risk Factors include: advanced age (>53men, >23 women);diabetes mellitus;obesity (BMI >30kg/m2);smoking/ tobacco exposure     Objective:    Today's Vitals   10/22/18 0814  BP: 124/62  Pulse: 63  Temp: 97.8 F (36.6 C)  TempSrc: Oral  SpO2: 96%  Weight: 173 lb 8 oz (78.7 kg)  Height: 5' 3.5" (1.613 m)  PainSc: 0-No pain   Body mass index is 30.25 kg/m.  Advanced Directives 10/22/2018 03/19/2018 08/17/2016  Does Patient Have a Medical Advance Directive? Yes No No  Type of Paramedic of [place];Living will - -  Copy of [place] in Chart? No - copy requested - -  Would patient like information on creating a medical advance directive? - - No - patient declined information    Current Medications (verified) Outpatient Encounter Medications as of 10/22/2018  Medication Sig  . acetaminophen (TYLENOL) 325 MG tablet Take 650 mg by mouth every 6 (six) hours as needed.  Marland Kitchen atorvastatin (LIPITOR) 20 MG tablet Take 1 tablet (20 mg total) by mouth daily.  . Calcium Carbonate-Vitamin D (CALCIUM-D) 600-400 MG-UNIT TABS Take 2 tablets by mouth daily.  . celecoxib (CELEBREX) 200 MG capsule TAKE 1 CAPSULE BY MOUTH EVERY DAY WITH FOOD  . cetirizine (ZYRTEC) 10 MG tablet Take 10 mg by mouth daily as needed for allergies.  . Cholecalciferol (VITAMIN D3) 1000 units CAPS Take 1 capsule by mouth daily.  . clobetasol ointment (TEMOVATE) 5.39 % Apply 1 application topically daily as needed.  . clonazePAM (KLONOPIN) 0.5 MG tablet TAKE ONE-HALF TABLET BY  MOUTH DAILY AS NEEDED  . diphenhydrAMINE (BENADRYL) 25 MG tablet Take 25 mg by mouth daily as needed.  . fexofenadine (ALLEGRA ALLERGY) 180 MG tablet Take 1 tablet by mouth daily as needed.  . fluticasone (FLONASE) 50 MCG/ACT nasal spray Place into both nostrils daily  as needed for allergies or rhinitis.  Marland Kitchen glucose blood (ONE TOUCH ULTRA TEST) test strip Use to check blood sugar once a day.  . Ibuprofen 200 MG CAPS Take 2 tablets by mouth daily as needed.  Marland Kitchen JANUVIA 100 MG tablet Take 1 tablet (100 mg total) by mouth daily.  Marland Kitchen levothyroxine (SYNTHROID, LEVOTHROID) 100 MCG tablet Take 1 tablet (100 mcg total) by mouth every morning.  . Multiple Vitamins-Minerals (MULTIVITAMIN ADULTS 50+) TABS Take 1 tablet by mouth daily.  . pioglitazone (ACTOS) 15 MG tablet Take 1 tablet (15 mg total) by mouth daily.  . [DISCONTINUED] Collagen Hydrolysate POWD Take 1 application by mouth daily.  . [DISCONTINUED] hydrOXYzine (ATARAX/VISTARIL) 25 MG tablet Take 1 tablet (25 mg total) by mouth 3 (three) times daily as needed.   No facility-administered encounter medications on file as of 10/22/2018.     Allergies (verified) Chantix [varenicline tartrate]; Metformin and related; Neosporin plus max st; Penicillins; Tape; Tetracyclines & related; and Wellbutrin [bupropion]   History: Past Medical History:  Diagnosis Date  . Diabetes mellitus without complication ([place])   . Thyroid disease    Past Surgical History:  Procedure Laterality Date  . ABDOMINAL HYSTERECTOMY  2001  . ABDOMINAL SURGERY    . ADENOIDECTOMY  1956  . APPENDECTOMY  1967  . BREAST EXCISIONAL BIOPSY Right    benign  . DENTAL SURGERY    . EYE SURGERY  2018   Cataract  . KNEE  ARTHROSCOPY WITH MENISCAL REPAIR Right    History reviewed. No pertinent family history. Social History   Socioeconomic History  . Marital status: Married    Spouse name: Not on file  . Number of children: Not on file  . Years of education: Not on file  . Highest education level: Not on file  Occupational History  . Not on file  Social Needs  . Financial resource strain: Not on file  . Food insecurity:    Worry: Not on file    Inability: Not on file  . Transportation needs:    Medical: Not on file    Non-medical:  Not on file  Tobacco Use  . Smoking status: Current Every Day Smoker    Packs/day: 0.50    Types: Cigarettes  . Smokeless tobacco: Never Used  Substance and Sexual Activity  . Alcohol use: Yes    Comment: occ  . Drug use: Never  . Sexual activity: Not on file  Lifestyle  . Physical activity:    Days per week: Not on file    Minutes per session: Not on file  . Stress: Not on file  Relationships  . Social connections:    Talks on phone: Not on file    Gets together: Not on file    Attends religious service: Not on file    Active member of club or organization: Not on file    Attends meetings of clubs or organizations: Not on file    Relationship status: Not on file  Other Topics Concern  . Not on file  Social History Narrative  . Not on file    Tobacco Counseling Ready to quit: No Counseling given: No   Clinical Intake:  Pre-visit preparation completed: Yes  Pain Score: 0-No pain     Nutritional Status: BMI > 30  Obese Nutritional Risks: None Diabetes: Yes CBG done?: No Did pt. bring in CBG monitor from home?: No  How often do you need to have someone help you when you read instructions, pamphlets, or other written materials from your doctor or pharmacy?: 1 - Never What is the last grade level you completed in school?: Bachelor degree  Interpreter Needed?: No  Comments: pt lives with spouse Information entered by :: LPinson, LPN   Activities of Daily Living In your present state of health, do you have any difficulty performing the following activities: 10/22/2018  Hearing? N  Vision? N  Difficulty concentrating or making decisions? N  Walking or climbing stairs? N  Dressing or bathing? N  Doing errands, shopping? N  Preparing Food and eating ? N  Using the Toilet? N  In the past six months, have you accidently leaked urine? N  Do you have problems with loss of bowel control? N  Managing your Medications? N  Managing your Finances? N  Housekeeping  or managing your Housekeeping? N  Some recent data might be hidden     Immunizations and Health Maintenance Immunization History  Administered Date(s) Administered  . Influenza-Unspecified 09/13/2018  . Pneumococcal Conjugate-13 10/19/2015  . Pneumococcal Polysaccharide-23 03/27/2013  . Tdap 09/12/2012  . Zoster Recombinat (Shingrix) 10/18/2017   There are no preventive care reminders to display for this patient.  Patient Care Team: Elby Beck, FNP as PCP - General (Nurse Practitioner)  Indicate any recent Medical Services you may have received from other than [facility] providers in the past year (date may be approximate).     Assessment:   This is a routine wellness  examination for [facility].  Hearing/Vision screen  Hearing Screening   125Hz  250Hz  500Hz  1000Hz  2000Hz  3000Hz  4000Hz  6000Hz  8000Hz   Right ear:   40 40 40  40    Left ear:   40 40 40  40    Vision Screening Comments: Vision exam in July 2019 with Dr. Katy Apo  Dietary issues and exercise activities discussed: Current Exercise Habits: Home exercise routine, Type of exercise: walking, Time (Minutes): > 60, Frequency (Times/Week): 3, Weekly Exercise (Minutes/Week): 0, Intensity: Moderate, Exercise limited by: None identified  Goals    . Patient Stated     Starting 10/22/18, I will continue to walk at least 7000 steps 2-3 days per week.       Depression Screen PHQ 2/9 Scores 10/22/2018  PHQ - 2 Score 0  PHQ- 9 Score 0    Fall Risk Fall Risk  10/22/2018 04/06/2018  Falls in the past year? 0 No   Cognitive Function: MMSE - Mini Mental State Exam 10/22/2018  Orientation to time 5  Orientation to Place 5  Registration 3  Attention/ Calculation 0  Recall 3  Language- name 2 objects 0  Language- repeat 1  Language- follow 3 step command 3  Language- read & follow direction 0  Write a sentence 0  Copy design 0  Total score 20     PLEASE NOTE: A Mini-Cog screen was completed. Maximum score is 20. A value  of 0 denotes this part of [place] MMSE was not completed or the patient failed this part of the Mini-Cog screening.   Mini-Cog Screening Orientation to Time - Max 5 pts Orientation to Place - Max 5 pts Registration - Max 3 pts Recall - Max 3 pts Language Repeat - Max 1 pts Language Follow 3 Step Command - Max 3 pts   Screening Tests Health Maintenance  Topic Date Due  . FOOT EXAM  11/05/2018 (Originally 06/12/1961)  . OPHTHALMOLOGY EXAM  03/30/2019  . HEMOGLOBIN A1C  04/22/2019  . URINE MICROALBUMIN  10/23/2019  . MAMMOGRAM  01/24/2020  . TETANUS/TDAP  09/12/2022  . COLONOSCOPY  11/24/2025  . INFLUENZA VACCINE  Completed  . DEXA SCAN  Completed  . Hepatitis C Screening  Completed  . PNA vac Low Risk Adult  Completed     Plan:     I have personally reviewed, addressed, and noted the following in the patient's chart:  A. Medical and social history B. Use of alcohol, tobacco or illicit drugs  C. Current medications and supplements D. Functional ability and status E.  Nutritional status F.  Physical activity G. Advance directives H. List of other physicians I.  Hospitalizations, surgeries, and ER visits in previous 12 months J.  [place] to include hearing, vision, cognitive, depression L. Referrals and appointments - none  In addition, I have reviewed and discussed with patient certain preventive protocols, quality metrics, and best practice recommendations. A written personalized care plan for preventive services as well as general preventive health recommendations were provided to patient.  See attached scanned questionnaire for additional information.   Signed,   Lindell Noe, MHA, BS, LPN Health Coach

## 2018-10-22 NOTE — Progress Notes (Signed)
PCP notes:   Health maintenance:  Foot exam - PCP follow-up requested A1C - completed Microalbumin - completed  Abnormal screenings:   None  Patient concerns:   None  Nurse concerns:  None  Next PCP appt:   11/05/18 @ 1530  I reviewed health advisor's note, was available for consultation on the day of service listed in this note, and agree with documentation and plan. Elsie Stain, MD.

## 2018-10-29 DIAGNOSIS — H43811 Vitreous degeneration, right eye: Secondary | ICD-10-CM | POA: Diagnosis not present

## 2018-10-31 ENCOUNTER — Encounter: Payer: Medicare Other | Admitting: Family Medicine

## 2018-11-02 DIAGNOSIS — L400 Psoriasis vulgaris: Secondary | ICD-10-CM | POA: Diagnosis not present

## 2018-11-02 DIAGNOSIS — I789 Disease of capillaries, unspecified: Secondary | ICD-10-CM | POA: Diagnosis not present

## 2018-11-05 ENCOUNTER — Ambulatory Visit (INDEPENDENT_AMBULATORY_CARE_PROVIDER_SITE_OTHER): Payer: Medicare Other | Admitting: Family Medicine

## 2018-11-05 ENCOUNTER — Encounter: Payer: Self-pay | Admitting: Family Medicine

## 2018-11-05 VITALS — BP 92/58 | HR 75 | Temp 97.8°F | Ht 63.5 in | Wt 173.8 lb

## 2018-11-05 DIAGNOSIS — L920 Granuloma annulare: Secondary | ICD-10-CM | POA: Diagnosis not present

## 2018-11-05 DIAGNOSIS — E119 Type 2 diabetes mellitus without complications: Secondary | ICD-10-CM | POA: Diagnosis not present

## 2018-11-05 DIAGNOSIS — F419 Anxiety disorder, unspecified: Secondary | ICD-10-CM | POA: Diagnosis not present

## 2018-11-05 MED ORDER — CLONAZEPAM 0.5 MG PO TABS: 0.5000 mg | ORAL_TABLET | Freq: Every day | ORAL | 1 refills | Status: DC | PRN

## 2018-11-05 NOTE — Patient Instructions (Signed)
Good to see you today  Call [facility] and see when your next colonoscopy is due  Follow up in 6 months-   Watch portions  Yoga twice a week

## 2018-11-05 NOTE — Progress Notes (Signed)
Subjective:    Patient ID: Kimberly Hartman, female    DOB: 1951/09/20, 68 y.o.   MRN: [id number]  HPI This is a 68 yo female who presents today for follow up of chronic medical conditions. Has been doing ok. Continues to volunteer at the [employer] and works part time at [employer].   DM- HgbA1C 7.3, up from 7.1. She is on Januvia 100 mg and Actos 15 mg. Diet recall - recall- no breakfast, lunch- cheese, cashews, fruit, tuna/chicken salad, dinner- cooks- pot roast, jambalya. Fasting BS 115-140. Feels like it is a struggle to make healthy food choices due to husband's eating habits.   Has refractive granuloma annulare- Ilumya- will be starting at infusion center, stopped tolerating Otezla.  Saw Ursula Alert at [facility] who started her on celebrex for shoulder pain. Little improvment.   Has not tolerated 1/2 tab of clonazepam .0.5 mg, needs to go up to whole tablet. Can't "quiet mind" with 1/2 tablet, poor sleep. Denies excessive daytime anxiety. This has been a longstanding issue for her.   ROS-  no chest pain, no SOB, no LE swelling, no diarrhea/ constipation/abdominal pain.   Past Medical History:  Diagnosis Date  . Diabetes mellitus without complication ([place])   . Thyroid disease    Past Surgical History:  Procedure Laterality Date  . ABDOMINAL HYSTERECTOMY  2001  . ABDOMINAL SURGERY    . ADENOIDECTOMY  1956  . APPENDECTOMY  1967  . BREAST EXCISIONAL BIOPSY Right    benign  . DENTAL SURGERY    . EYE SURGERY  2018   Cataract  . KNEE ARTHROSCOPY WITH MENISCAL REPAIR Right    No family history on file. Social History   Tobacco Use  . Smoking status: Current Every Day Smoker    Packs/day: 0.50    Types: Cigarettes  . Smokeless tobacco: Never Used  Substance Use Topics  . Alcohol use: Yes    Comment: occ  . Drug use: Never      Review of Systems Per HPI    Objective:   Physical Exam Vitals signs reviewed.  Constitutional:      General:  She is not in acute distress.    Appearance: Normal appearance. She is obese. She is not ill-appearing, toxic-appearing or diaphoretic.  HENT:     Head: Atraumatic.  Cardiovascular:     Rate and Rhythm: Normal rate and regular rhythm.     Heart sounds: Normal heart sounds.  Pulmonary:     Breath sounds: Normal breath sounds.  Musculoskeletal:        General: No swelling.  Skin:    General: Skin is warm and dry.     Comments: Bilateral soles of feet with generalized scaling, erythema.   Neurological:     Mental Status: She is alert and oriented to person, place, and time.  Psychiatric:        Mood and Affect: Mood normal.        Behavior: Behavior normal.        Thought Content: Thought content normal.        Judgment: Judgment normal.       BP (!) 92/58   Pulse 75   Temp 97.8 F (36.6 C) (Oral)   Ht 5' 3.5" (1.613 m)   Wt 173 lb 12.8 oz (78.8 kg)   SpO2 94%   BMI 30.30 kg/m  Wt Readings from Last 3 Encounters:  11/05/18 173 lb 12.8 oz (78.8 kg)  10/22/18 173  lb 8 oz (78.7 kg)  04/06/18 163 lb 12 oz (74.3 kg)   BP Readings from Last 3 Encounters:  11/05/18 (!) 92/58  10/22/18 124/62  04/06/18 (!) 128/58       Assessment & Plan:  1. Controlled type 2 diabetes mellitus without complication, without long-term current use of insulin (HCC) - hemoglobin A1C and weight up a little, will work on lifestyle- watch portions, increase exercise - Follow up in 6 months  2. Granuloma annulare - continue follow up with dermatology  3. Anxiety - encouraged increased exercise, meditation/yoga - clonazePAM (KLONOPIN) 0.5 MG tablet; Take 1 tablet (0.5 mg total) by mouth daily as needed for anxiety.  Dispense: 90 tablet; Refill: [place], FNP-BC  [facility]  11/07/2018 8:34 PM

## 2018-11-05 NOTE — Progress Notes (Signed)
Pre visit review using our clinic review tool, if applicable. No additional management support is needed unless otherwise documented below in the visit note. 

## 2018-11-13 ENCOUNTER — Other Ambulatory Visit: Payer: Self-pay | Admitting: Family Medicine

## 2018-11-13 ENCOUNTER — Encounter: Payer: Self-pay | Admitting: Family Medicine

## 2018-11-13 DIAGNOSIS — Z1211 Encounter for screening for malignant neoplasm of colon: Secondary | ICD-10-CM

## 2018-11-14 ENCOUNTER — Telehealth: Payer: Self-pay | Admitting: Gastroenterology

## 2018-11-14 NOTE — Telephone Encounter (Signed)
OK for direct colonoscopy with me.

## 2018-11-14 NOTE — Telephone Encounter (Signed)
Good Morning,  Patient was referred over to our office for a colonoscopy. Last colon was done in 2017 I Have sent over the record to you but you can also find it in her chart-media tab. DOB [birth year]. Let me know if you would like to sch for a OV first.   Thank You,

## 2018-11-14 NOTE — Telephone Encounter (Signed)
The referral was sent over yesterday 02.11.20 And Dr. Fuller Plan was DOD.

## 2018-11-15 ENCOUNTER — Encounter: Payer: Self-pay | Admitting: Gastroenterology

## 2018-11-22 ENCOUNTER — Inpatient Hospital Stay: Payer: Medicare Other

## 2018-11-22 ENCOUNTER — Other Ambulatory Visit: Payer: Self-pay | Admitting: Hematology and Oncology

## 2018-11-22 ENCOUNTER — Inpatient Hospital Stay: Payer: Medicare Other | Attending: Hematology and Oncology | Admitting: Hematology and Oncology

## 2018-11-22 ENCOUNTER — Encounter: Payer: Self-pay | Admitting: Hematology and Oncology

## 2018-11-22 ENCOUNTER — Other Ambulatory Visit: Payer: Self-pay

## 2018-11-22 VITALS — BP 122/76 | HR 65 | Temp 97.5°F | Resp 16 | Wt 171.4 lb

## 2018-11-22 DIAGNOSIS — Z720 Tobacco use: Secondary | ICD-10-CM

## 2018-11-22 DIAGNOSIS — Z808 Family history of malignant neoplasm of other organs or systems: Secondary | ICD-10-CM | POA: Diagnosis not present

## 2018-11-22 DIAGNOSIS — L400 Psoriasis vulgaris: Secondary | ICD-10-CM

## 2018-11-22 DIAGNOSIS — Z809 Family history of malignant neoplasm, unspecified: Secondary | ICD-10-CM

## 2018-11-22 DIAGNOSIS — L409 Psoriasis, unspecified: Secondary | ICD-10-CM | POA: Insufficient documentation

## 2018-11-22 MED ORDER — TILDRAKIZUMAB-ASMN 100 MG/ML ~~LOC~~ SOSY
100.0000 mg | PREFILLED_SYRINGE | Freq: Once | SUBCUTANEOUS | Status: AC
  Administered 2018-11-22: 100 mg via SUBCUTANEOUS
  Filled 2018-11-22: qty 1

## 2018-11-22 NOTE — Progress Notes (Signed)
[facility]     [address]     [place], [place] [address]     Phone: [phone]      Fax: [phone]       Clinic day:  11/22/2018  Chief Complaint: Kimberly Hartman is a 68 y.o. female with psoriasis vulgaris who is referred by Dr. Alfonso Patten for initiation of Ilumya (tildrakizumab).  HPI:   The patient developed psoriasis in her middle to late 8s.    Psoriasis on her feet has made it painful to walk.  She has been on Humira, [place], [place]  She has been on [place], "every topical medication", PUVA, and laser treatments.  Labs on 09/19/2018 revealed a hematocrit of 43.1, hemoglobin 13.6, MCV 88, platelets 351,000, WBC 10,000 with an ANC of 5200.  Creatinine was 0.76.  LFTs were normal.  Quantiferon gold was negative. Hepatitis B surface antibody, hepatitis B surface antigen, hepatitis B core antibody total, hepatitis C antibody, and HIV testing was negative.  Symptomatically, she feels "pretty good".  She denies any fevers, sweats or weight loss.  She has had a little runny nose x 2 weeks.  She has seasonal allergies.  Secretions are clear.     Past Medical History:  Diagnosis Date  . Anxiety   . Arthritis   . Cataract   . Diabetes mellitus without complication ([place])   . Psoriasis   . Thyroid disease     Past Surgical History:  Procedure Laterality Date  . ABDOMINAL HYSTERECTOMY  2001  . ABDOMINAL SURGERY    . ADENOIDECTOMY  1956  . APPENDECTOMY  1967  . BREAST EXCISIONAL BIOPSY Right    benign  . DENTAL SURGERY    . EYE SURGERY  2018   Cataract  . KNEE ARTHROSCOPY WITH MENISCAL REPAIR Right     Family History  Problem Relation Age of Onset  . Cancer Mother   . Cancer Father   . Esophageal cancer Father   . Stroke Maternal Grandmother   . Colon cancer Neg Hx   . Rectal cancer Neg Hx   . Stomach cancer Neg Hx     Social History:  reports that she has been smoking cigarettes. She has been smoking about 0.50 packs per  day. She has never used smokeless tobacco. She reports current alcohol use. She reports that she does not use drugs.  She previously lived in [place].  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  She works part-time at [employer].  She lives in [place].  The patient is alone today.  Allergies:  Allergies  Allergen Reactions  . Chantix [Varenicline Tartrate]   . Metformin And Related   . Neosporin Plus Max St Dermatitis, Itching, Rash and Swelling  . Penicillins Rash    TOLERATED KEFLEX WITH NO ISSUES  . Tape Dermatitis and Rash    THE ADHESIVE PART NOT THE ACTUAL TAPE  . Tetracyclines & Related Rash  . Wellbutrin [Bupropion] Rash    Current Medications: Current Outpatient Medications  Medication Sig Dispense Refill  . acetaminophen (TYLENOL) 325 MG tablet Take 650 mg by mouth every 6 (six) hours as needed.    Marland Kitchen atorvastatin (LIPITOR) 20 MG tablet Take 1 tablet (20 mg total) by mouth daily. 90 tablet 3  . Calcium Carbonate-Vitamin D (CALCIUM-D) 600-400 MG-UNIT TABS Take 2 tablets by mouth daily.    . celecoxib (CELEBREX) 200 MG capsule Take 200 mg by mouth as needed.     Marland Kitchen  cetirizine (ZYRTEC) 10 MG tablet Take 10 mg by mouth daily as needed for allergies.    . Cholecalciferol (VITAMIN D3) 1000 units CAPS Take 1 capsule by mouth daily.    . clonazePAM (KLONOPIN) 0.5 MG tablet Take 1 tablet (0.5 mg total) by mouth daily as needed for anxiety. 90 tablet 1  . diphenhydrAMINE (BENADRYL) 25 MG tablet Take 25 mg by mouth daily as needed.    . fluticasone (FLONASE) 50 MCG/ACT nasal spray Place into both nostrils daily as needed for allergies or rhinitis.    Marland Kitchen glucose blood (ONE TOUCH ULTRA TEST) test strip Use to check blood sugar once a day. 100 each 3  . Ibuprofen 200 MG CAPS Take 2 tablets by mouth daily as needed.    Marland Kitchen JANUVIA 100 MG tablet Take 1 tablet (100 mg total) by mouth daily. 90 tablet 3  . levothyroxine (SYNTHROID, LEVOTHROID) 100 MCG tablet Take 1 tablet (100 mcg  total) by mouth every morning. 90 tablet 3  . Multiple Vitamins-Minerals (MULTIVITAMIN ADULTS 50+) TABS Take 1 tablet by mouth daily.    Marland Kitchen augmented betamethasone dipropionate (DIPROLENE-AF) 0.05 % ointment Apply 1 application topically at bedtime.    . pioglitazone (ACTOS) 15 MG tablet Take 1 tablet (15 mg total) by mouth daily. 90 tablet 3  . Tildrakizumab-asmn (ILUMYA [place]) Inject into the skin. One injection q 4 weeks x 2 , then every 12 weeks     No current facility-administered medications for this visit.     Review of Systems:  GENERAL:  Feels "pretty good".  No fevers, sweats or weight loss. PERFORMANCE STATUS (ECOG):  1 HEENT:  Runny nose x 2 weeks secondary to allergies.  No visual changes, sore throat, mouth sores or tenderness. Lungs: No shortness of breath or cough.  No hemoptysis. Cardiac:  No chest pain, palpitations, orthopnea, or PND. GI:  No nausea, vomiting, diarrhea, constipation, melena or hematochezia. GU:  No urgency, frequency, dysuria, or hematuria. Musculoskeletal:  No back pain.  Possible osteoarthritis.  No muscle tenderness. Extremities:  No pain or swelling. Skin:  Severe psoriasis on feet causing difficulty with ambulation. Neuro:  No headache, numbness or weakness, balance or coordination issues. Endocrine:  Diabetes.  Thyroid disease on Synthroid.  No hot flashes or night sweats. Psych:  No mood changes, depression or anxiety. Pain:  No focal pain. Review of systems:  All other systems reviewed and found to be negative.  Physical Exam: Blood pressure 122/76, pulse 65, temperature (!) 97.5 F (36.4 C), temperature source Oral, resp. rate 16, weight 171 lb 6.5 oz (77.7 kg), SpO2 100 %. GENERAL:  Well developed, well nourished, woman sitting comfortably in the exam room in no acute distress. MENTAL STATUS:  Alert and oriented to person, place and time. HEAD:  Short dark hair.  Normocephalic, atraumatic, face symmetric, no Cushingoid features. EYES:  Blue  rimmed glasses.  Brown eyes.  Pupils equal round and reactive to light and accomodation.  No conjunctivitis or scleral icterus. ENT:  Oropharynx clear without lesion.  Tongue normal. Mucous membranes moist.  RESPIRATORY:  Clear to auscultation without rales, wheezes or rhonchi. CARDIOVASCULAR:  Regular rate and rhythm without murmur, rub or gallop. ABDOMEN:  Soft, non-tender, with active bowel sounds, and no hepatosplenomegaly.  No masses. SKIN:  Marked psoriasis involving plantar surface of feet. EXTREMITIES: No edema, no skin discoloration or tenderness.  No palpable cords. LYMPH NODES: No palpable cervical, supraclavicular, axillary or inguinal adenopathy  NEUROLOGICAL: Unremarkable. PSYCH:  Appropriate.  No visits with results within 3 Day(s) from this visit.  Latest known visit with results is:  Clinical Support on 10/22/2018  Component Date Value Ref Range Status  . Microalb, Ur 10/22/2018 3.2* 0.0 - 1.9 mg/dL Final  . Creatinine,U 10/22/2018 224.2  mg/dL Final  . Microalb Creat Ratio 10/22/2018 1.4  0.0 - 30.0 mg/g Final  . Hgb A1c MFr Bld 10/22/2018 7.3* 4.6 - 6.5 % Final   Glycemic Control Guidelines for People with Diabetes:Non Diabetic:  <6%Goal of Therapy: <7%Additional Action Suggested:  >8%   . Cholesterol 10/22/2018 139  0 - 200 mg/dL Final   ATP III Classification       Desirable:  < 200 mg/dL               Borderline High:  200 - 239 mg/dL          High:  > = 240 mg/dL  . Triglycerides 10/22/2018 103.0  0.0 - 149.0 mg/dL Final   Normal:  <150 mg/dLBorderline High:  150 - 199 mg/dL  . HDL 10/22/2018 41.60  >39.00 mg/dL Final  . VLDL 10/22/2018 20.6  0.0 - 40.0 mg/dL Final  . LDL Cholesterol 10/22/2018 77  0 - 99 mg/dL Final  . Total CHOL/HDL Ratio 10/22/2018 3   Final                  Men          Women1/2 Average Risk     3.4          3.3Average Risk          5.0          4.42X Average Risk          9.6          7.13X Average Risk          15.0          11.0                       . NonHDL 10/22/2018 97.12   Final   NOTE:  Non-HDL goal should be 30 mg/dL higher than patient's LDL goal (i.e. LDL goal of < 70 mg/dL, would have non-HDL goal of < 100 mg/dL)  . TSH 10/22/2018 4.45  0.35 - 4.50 uIU/mL Final    Assessment:  Kimberly Hartman is a 68 y.o. female with psoriasis vulgaris.  She developed psoriasis in her 80s.  Psoriasis mainly involves her feet and causes difficulty with ambulation (pain).  She has received Humira, Embril, [place], [place], "every topical medication", PUVA, and laser treatments.  Labs on 09/19/2018 revealed a hematocrit of 43.1, hemoglobin 13.6, MCV 88, platelets 351,000, WBC 10,000 with an ANC of 5200.  Creatinine was 0.76.  LFTs were normal.  Negative studies included:  quantiferon gold , hepatitis B surface antibody, hepatitis B surface antigen, hepatitis B core antibody total, hepatitis C antibody, and HIV testing.  Symptomatically, she notes a 2 week history of clear runny nose secondary to allergies.  Exam reveals marked psoriasis involving her feet.  Plan: 1.  Psoriasis vulgais  Discuss diagnosis and management of psoriasis to date.  Discuss plan for Ilumya   Ilumya 100 mg SQ on week 0, week 4 then every 12 weeks for maintenance.  Potential side effects of Ilumyareviewed:  allergic reactions and infections.   Review role of infusion center.  Patient to follow-up with her dermatologist on 01/03/2019.  Patient consented to treatment.  2.   Ilumya today. 3.   RTC in 4 weeks for Ilumya. 4.   RTC 12 weeks later for MD assessment and Christianne Dolin, MD  11/22/2018, 4:35 PM

## 2018-11-22 NOTE — Progress Notes (Signed)
Pt referred by Dr. Laurence Ferrari for Psoriasis primarily on bilateral feet, occasional break outs on hands as well. PT states she has been DX with this since she was in her 67's. States nothing has been helping.   Denies any concerns at this time.

## 2018-12-11 ENCOUNTER — Other Ambulatory Visit: Payer: Self-pay

## 2018-12-11 ENCOUNTER — Ambulatory Visit (AMBULATORY_SURGERY_CENTER): Payer: Self-pay

## 2018-12-11 VITALS — Ht 63.5 in | Wt 172.4 lb

## 2018-12-11 DIAGNOSIS — Z86010 Personal history of colonic polyps: Secondary | ICD-10-CM

## 2018-12-11 NOTE — Progress Notes (Signed)
No egg or soy allergy known to patient  No issues with past sedation with any surgeries  or procedures, no intubation problems  No diet pills per patient No home 02 use per patient  No blood thinners per patient  Pt denies issues with constipation  No A fib or A flutter  EMMI video sent to pt's e mail  

## 2018-12-17 ENCOUNTER — Telehealth: Payer: Self-pay | Admitting: Gastroenterology

## 2018-12-17 DIAGNOSIS — Z86010 Personal history of colonic polyps: Secondary | ICD-10-CM

## 2018-12-17 MED ORDER — NA SULFATE-K SULFATE-MG SULF 17.5-3.13-1.6 GM/177ML PO SOLN: ORAL | 0 refills | Status: DC

## 2018-12-17 NOTE — Telephone Encounter (Signed)
Patient needs her prep sent to: [facility] [address] [place] [phone]   She was in on 12-11-18 for her Pre-visit.

## 2018-12-17 NOTE — Telephone Encounter (Signed)
[facility] notified [facility].

## 2018-12-20 ENCOUNTER — Inpatient Hospital Stay: Payer: Medicare Other | Attending: Hematology and Oncology

## 2018-12-20 ENCOUNTER — Other Ambulatory Visit: Payer: Self-pay

## 2018-12-20 VITALS — BP 123/72 | HR 71 | Temp 97.8°F | Resp 18

## 2018-12-20 DIAGNOSIS — L400 Psoriasis vulgaris: Secondary | ICD-10-CM | POA: Diagnosis not present

## 2018-12-20 MED ORDER — TILDRAKIZUMAB-ASMN 100 MG/ML ~~LOC~~ SOSY
100.0000 mg | PREFILLED_SYRINGE | Freq: Once | SUBCUTANEOUS | Status: AC
  Administered 2018-12-20: 100 mg via SUBCUTANEOUS
  Filled 2018-12-20: qty 1

## 2018-12-20 NOTE — Patient Instructions (Signed)
Tildrakizumab Injection What is this medicine? TILDRAKIZUMAB (til drak iz u mab) is a monoclonal antibody. It is used to treat psoriasis. This medicine may be used for other purposes; ask your health care provider or pharmacist if you have questions. COMMON BRAND NAME(S): ILUMYA What should I tell my health care provider before I take this medicine? They need to know if you have any of these conditions: -immune system problems -infection -recently received or are scheduled to receive a vaccine -tuberculosis, a positive skin test for tuberculosis, or have recently been in close contact with someone who has tuberculosis -an unusual or allergic reaction to tildrakizumab, other medicines, foods, dyes or preservatives -pregnant or trying to get pregnant -breast-feeding How should I use this medicine? This medicine is for injection under the skin. It is given by a health care professional in a hospital or clinic setting. A special MedGuide will be given to you before each treatment. Be sure to read this information carefully each time. Talk to your pediatrician regarding the use of this medicine in children. Special care may be needed. Overdosage: If you think you have taken too much of this medicine contact a poison control center or emergency room at once. NOTE: This medicine is only for you. Do not share this medicine with others. What if I miss a dose? It is important not to miss your dose. Call your doctor of health care professional if you are unable to keep an appointment. If you miss a dose, take it as soon as you can. Then be sure to take your next dose on your regular schedule. Do not take double or extra doses. If you have questions about a missed injection, call your health care professional. What may interact with this medicine? Do not take this medicine with any of the following medications: -live virus vaccines This medicine may also interact with the following  medications: -inactivated vaccines This list may not describe all possible interactions. Give your health care provider a list of all the medicines, herbs, non-prescription drugs, or dietary supplements you use. Also tell them if you smoke, drink alcohol, or use illegal drugs. Some items may interact with your medicine. What should I watch for while using this medicine? Tell your doctor or health care professional if your symptoms do not start to get better or if they get worse. You will be tested for tuberculosis (TB) before you start this medicine. If your doctor prescribes any medicine for TB, you should start taking the TB medicine before starting this medicine. Make sure to finish the full course of TB medicine. Call your doctor or health care professional for advice if you get a fever, chills or sore throat, or other symptoms of a cold or flu. Do not treat yourself. This drug decreases your body's ability to fight infections. Try to avoid being around people who are sick. This medicine can decrease the response to a vaccine. If you need to get vaccinated, tell your health care professional if you have received this medicine. Extra booster doses may be needed. Talk to your doctor to see if a different vaccination schedule is needed. What side effects may I notice from receiving this medicine? Side effects that you should report to your doctor or health care professional as soon as possible: -allergic reactions like skin rash; itching or hives; swelling of the face, lips, or tongue -fever or chills; cough; sore throat; pain or trouble passing urine Side effects that usually do not require medical attention (report   these to your doctor or health care professional if they continue or are bothersome): -diarrhea -pain, redness, or irritation at site where injected This list may not describe all possible side effects. Call your doctor for medical advice about side effects. You may report side effects  to FDA at 1-800-FDA-1088. Where should I keep my medicine? This drug is given in a hospital or clinic and will not be stored at home. NOTE: This sheet is a summary. It may not cover all possible information. If you have questions about this medicine, talk to your doctor, pharmacist, or health care provider.  2019 Elsevier/Gold Standard (2016-12-22 18:16:48)  

## 2018-12-24 ENCOUNTER — Encounter: Payer: Medicare Other | Admitting: Gastroenterology

## 2019-01-03 DIAGNOSIS — L400 Psoriasis vulgaris: Secondary | ICD-10-CM | POA: Diagnosis not present

## 2019-01-23 DIAGNOSIS — L400 Psoriasis vulgaris: Secondary | ICD-10-CM | POA: Diagnosis not present

## 2019-01-25 ENCOUNTER — Other Ambulatory Visit: Payer: Self-pay | Admitting: Family Medicine

## 2019-01-25 ENCOUNTER — Other Ambulatory Visit: Payer: Self-pay | Admitting: *Deleted

## 2019-01-25 DIAGNOSIS — E119 Type 2 diabetes mellitus without complications: Secondary | ICD-10-CM

## 2019-01-25 MED ORDER — PIOGLITAZONE HCL 15 MG PO TABS: 15.0000 mg | ORAL_TABLET | Freq: Every day | ORAL | 3 refills | Status: DC

## 2019-01-25 NOTE — Telephone Encounter (Signed)
Patient notified by telephone that script has been sent to the pharmacy. 

## 2019-01-25 NOTE — Telephone Encounter (Signed)
Patient left a voicemail stating that [facility] told her that they sent a refill request over for Actos and it was denied. Patient stated that she only has about one and a half weeks worth left. Patient stated that she does not understanding why it was denied. Patient requested a call back letting her know if this is going to be refilled or is she suppose to stop taking it? Last office visit 11/05/18,  Last refill 07/09/18 #90/3

## 2019-01-25 NOTE — Telephone Encounter (Signed)
Left message for Kimberly Hartman that [place] sent refills for her Actos to [facility].  I ask that she call us back if she has any problems.

## 2019-01-25 NOTE — Telephone Encounter (Signed)
Please call patient and tell her that I did not get a refill request for her Actos. I have sent in a refill to her mail order pharmacy. If she has any problems, pleas call back.

## 2019-02-04 ENCOUNTER — Telehealth: Payer: Self-pay | Admitting: *Deleted

## 2019-02-04 DIAGNOSIS — Z86010 Personal history of colonic polyps: Secondary | ICD-10-CM

## 2019-02-04 MED ORDER — NA SULFATE-K SULFATE-MG SULF 17.5-3.13-1.6 GM/177ML PO SOLN: 1.0000 | Freq: Once | ORAL | 0 refills | Status: AC

## 2019-02-04 NOTE — Telephone Encounter (Signed)
Rescheduled pt procedure, pt had instructions and wrote in new times on her instructions with clear understanding. Reviewed care partner information, asked to bring and wear mask into the building.

## 2019-02-15 ENCOUNTER — Telehealth: Payer: Self-pay | Admitting: *Deleted

## 2019-02-15 NOTE — Telephone Encounter (Signed)
Pt returned your call and would like another call. °

## 2019-02-15 NOTE — Telephone Encounter (Signed)
LMOM to go over screening questions

## 2019-02-15 NOTE — Telephone Encounter (Signed)
Covid-19 travel screening questions  Have you traveled in the last 14 days? no If yes where?  Do you now or have you had a fever in the last 14 days?no  Do you have any respiratory symptoms of shortness of breath or cough now or in the last 14 days? no  Do you have any family members or close contacts with diagnosed or suspected Covid-19? No  Pt is aware care partner will be waiting in the car.  She will wear a mask into building

## 2019-02-18 ENCOUNTER — Other Ambulatory Visit: Payer: Self-pay

## 2019-02-18 ENCOUNTER — Ambulatory Visit (AMBULATORY_SURGERY_CENTER): Payer: Medicare Other | Admitting: Gastroenterology

## 2019-02-18 ENCOUNTER — Encounter: Payer: Self-pay | Admitting: Gastroenterology

## 2019-02-18 VITALS — BP 136/66 | HR 57 | Temp 98.2°F | Resp 18 | Ht 63.75 in | Wt 172.0 lb

## 2019-02-18 DIAGNOSIS — D128 Benign neoplasm of rectum: Secondary | ICD-10-CM

## 2019-02-18 DIAGNOSIS — Z86010 Personal history of colonic polyps: Secondary | ICD-10-CM | POA: Diagnosis not present

## 2019-02-18 DIAGNOSIS — K5730 Diverticulosis of large intestine without perforation or abscess without bleeding: Secondary | ICD-10-CM

## 2019-02-18 DIAGNOSIS — D127 Benign neoplasm of rectosigmoid junction: Secondary | ICD-10-CM | POA: Diagnosis not present

## 2019-02-18 DIAGNOSIS — D123 Benign neoplasm of transverse colon: Secondary | ICD-10-CM

## 2019-02-18 DIAGNOSIS — D124 Benign neoplasm of descending colon: Secondary | ICD-10-CM

## 2019-02-18 DIAGNOSIS — D125 Benign neoplasm of sigmoid colon: Secondary | ICD-10-CM | POA: Diagnosis not present

## 2019-02-18 DIAGNOSIS — K5720 Diverticulitis of large intestine with perforation and abscess without bleeding: Secondary | ICD-10-CM

## 2019-02-18 DIAGNOSIS — D129 Benign neoplasm of anus and anal canal: Secondary | ICD-10-CM

## 2019-02-18 DIAGNOSIS — Z1211 Encounter for screening for malignant neoplasm of colon: Secondary | ICD-10-CM | POA: Diagnosis not present

## 2019-02-18 DIAGNOSIS — K635 Polyp of colon: Secondary | ICD-10-CM

## 2019-02-18 MED ORDER — CIPROFLOXACIN HCL 500 MG PO TABS: 500.0000 mg | ORAL_TABLET | Freq: Two times a day (BID) | ORAL | 0 refills | Status: AC

## 2019-02-18 MED ORDER — METRONIDAZOLE 500 MG PO TABS: 500.0000 mg | ORAL_TABLET | Freq: Two times a day (BID) | ORAL | 0 refills | Status: AC

## 2019-02-18 MED ORDER — SODIUM CHLORIDE 0.9 % IV SOLN: 500.0000 mL | Freq: Once | INTRAVENOUS | Status: DC

## 2019-02-18 NOTE — Progress Notes (Signed)
To PACU, VSS. Report to [email or web address] 

## 2019-02-18 NOTE — Progress Notes (Signed)
Pt's states no medical or surgical changes since previsit or office visit. 

## 2019-02-18 NOTE — Patient Instructions (Signed)
Thank you for allowing [place] to care for you today!  Await pathology results by mail, approximately 2 weeks.  Recommend next colonoscopy in 3 years.  Resume previous diet and medications today.  Return to normal activities tomorrow.  [facility] [place].     YOU HAD AN ENDOSCOPIC PROCEDURE TODAY AT [facility]:   Refer to the procedure report that was given to you for any specific questions about what was found during the examination.  If the procedure report does not answer your questions, please call your gastroenterologist to clarify.  If you requested that your care partner not be given the details of your procedure findings, then the procedure report has been included in a sealed envelope for you to review at your convenience later.  YOU SHOULD EXPECT: Some feelings of bloating in the abdomen. Passage of more gas than usual.  Walking can help get rid of the air that was put into your GI tract during the procedure and reduce the bloating. If you had a lower endoscopy (such as a colonoscopy or flexible sigmoidoscopy) you may notice spotting of blood in your stool or on the toilet paper. If you underwent a bowel prep for your procedure, you may not have a normal bowel movement for a few days.  Please Note:  You might notice some irritation and congestion in your nose or some drainage.  This is from the oxygen used during your procedure.  There is no need for concern and it should clear up in a day or so.  SYMPTOMS TO REPORT IMMEDIATELY:   Following lower endoscopy (colonoscopy or flexible sigmoidoscopy):  Excessive amounts of blood in the stool  Significant tenderness or worsening of abdominal pains  Swelling of the abdomen that is new, acute  Fever of 100F or higher    Black, tarry-looking stools  For urgent or emergent issues, a gastroenterologist can be reached at any hour by calling [phone].   DIET:  We do recommend  a small meal at first, but then you may proceed to your regular diet.  Drink plenty of fluids but you should avoid alcoholic beverages for 24 hours.  ACTIVITY:  You should plan to take it easy for the rest of today and you should NOT DRIVE or use heavy machinery until tomorrow (because of the sedation medicines used during the test).    FOLLOW UP: Our staff will call the number listed on your records 48-72 hours following your procedure to check on you and address any questions or concerns that you may have regarding the information given to you following your procedure. If we do not reach you, we will leave a message.  We will attempt to reach you two times.  During this call, we will ask if you have developed any symptoms of COVID 19. If you develop any symptoms (for example fever, flu-like symptoms, shortness of breath, cough etc.) before then, please call [phone].  If any biopsies were taken you will be contacted by phone or by letter within the next 1-3 weeks.  Please call us at [phone] if you have not heard about the biopsies in 3 weeks.    SIGNATURES/CONFIDENTIALITY: You and/or your care partner have signed paperwork which will be entered into your electronic medical record.  These signatures attest to the fact that that the information above on your After Visit Summary has been reviewed and is understood.  Full responsibility of the confidentiality of this discharge  information lies with you and/or your care-partner. 

## 2019-02-18 NOTE — Progress Notes (Signed)
Called to room to assist during endoscopic procedure.  Patient ID and intended procedure confirmed with present staff. Received instructions for my participation in the procedure from the performing physician.  

## 2019-02-18 NOTE — Op Note (Signed)
[facility] Patient Name: Kimberly Hartman Procedure Date: 02/18/2019 9:26 AM MRN: [id number] Endoscopist: Ladene Artist , MD Age: 68 Referring MD:  Date of Birth: [birth year] Gender: Female Account #: [id number] Procedure:                Colonoscopy Indications:              Surveillance: Personal history of adenomatous                            polyps on last colonoscopy 3 years ago Medicines:                Monitored Anesthesia Care Procedure:                Pre-Anesthesia Assessment:                           - Prior to the procedure, a History and Physical                            was performed, and patient medications and                            allergies were reviewed. The patient's tolerance of                            previous anesthesia was also reviewed. The risks                            and benefits of the procedure and the sedation                            options and risks were discussed with the patient.                            All questions were answered, and informed consent                            was obtained. Prior Anticoagulants: The patient has                            taken no previous anticoagulant or antiplatelet                            agents. ASA Grade Assessment: II - A patient with                            mild systemic disease. After reviewing the risks                            and benefits, the patient was deemed in                            satisfactory condition to undergo the procedure.  After obtaining informed consent, the colonoscope                            was passed under direct vision. Throughout the                            procedure, the patient's blood pressure, pulse, and                            oxygen saturations were monitored continuously. The                            Colonoscope was introduced through the anus and                            advanced to the the cecum,  identified by                            appendiceal orifice and ileocecal valve. The                            ileocecal valve, appendiceal orifice, and rectum                            were photographed. The quality of the bowel                            preparation was good. The colonoscopy was performed                            without difficulty. The patient tolerated the                            procedure well. Scope In: 9:40:55 AM Scope Out: 10:04:28 AM Scope Withdrawal Time: 0 hours 19 minutes 29 seconds  Total Procedure Duration: 0 hours 23 minutes 33 seconds  Findings:                 The perianal and digital rectal examinations were                            normal.                           A 5 mm polyp was found in the transverse colon. The                            polyp was sessile. The polyp was removed with a                            cold biopsy forceps. Resection and retrieval were                            complete.  Six sessile polyps were found in the rectum (2),                            sigmoid colon (1), descending colon (2) and                            transverse colon (1). The polyps were 4 to 8 mm in                            size. These polyps were removed with a cold snare.                            Resection and retrieval were complete.                           Multiple medium-mouthed diverticula were found in                            the left colon. There was narrowing of the colon in                            association with the diverticular opening.                            Peri-diverticular erythema was seen. There was                            evidence of an impacted diverticulum. Purulent                            discharge was seen in association with the                            diverticular opening, suspicious of diverticulitis.                            There was no evidence of diverticular  bleeding.                           The exam was otherwise without abnormality on                            direct and retroflexion views. Complications:            No immediate complications. Estimated blood loss:                            None. Estimated Blood Loss:     Estimated blood loss: none. Impression:               - One 5 mm polyp in the transverse colon, removed                            with a cold biopsy forceps. Resected and retrieved.                           -  Six 4 to 8 mm polyps in the rectum, in the                            sigmoid colon, in the descending colon and in the                            transverse colon, removed with a cold snare.                            Resected and retrieved.                           - Moderate diverticulosis in the left colon.                            Purulent discharge was seen in association with the                            diverticular opening, suspicious of diverticulitis.                           - The examination was otherwise normal on direct                            and retroflexion views. Recommendation:           - Repeat colonoscopy in 3 years for surveillance.                           - Patient has a contact number available for                            emergencies. The signs and symptoms of potential                            delayed complications were discussed with the                            patient. Return to normal activities tomorrow.                            Written discharge instructions were provided to the                            patient.                           - Resume previous diet.                           - Continue present medications.                           - Await pathology results.                           -  Cipro (ciprofloxacin) 500 mg PO BID for 5 days.                           - Flagyl (metronidazole) 500 mg PO BID for 5 days. Ladene Artist, MD 02/18/2019  10:12:17 AM This report has been signed electronically.

## 2019-02-20 ENCOUNTER — Telehealth: Payer: Self-pay

## 2019-02-20 NOTE — Telephone Encounter (Signed)
  Follow up Call-  Call back number 02/18/2019  Post procedure Call Back phone  # [phone]  Permission to leave phone message Yes  Some recent data might be hidden     Patient questions:  Do you have a fever, pain , or abdominal swelling? No. Pain Score  0 *  Have you tolerated food without any problems? Yes.    Have you been able to return to your normal activities? Yes.    Do you have any questions about your discharge instructions: Diet   No. Medications  No. Follow up visit  No.  Do you have questions or concerns about your Care? No.  Actions: * If pain score is 4 or above: No action needed, pain <4.  1. Have you developed a fever since your procedure? no  2.   Have you had an respiratory symptoms (SOB or cough) since your procedure? no  3.   Have you tested positive for COVID 19 since your procedure no  3.   Have you had any family members/close contacts diagnosed with the COVID 19 since your procedure?  no   If any of these questions are a yes, please inquire if patient has been seen by family doctor and route this note to Joylene John, Therapist, sports.

## 2019-02-26 ENCOUNTER — Encounter: Payer: Self-pay | Admitting: Gastroenterology

## 2019-02-27 DIAGNOSIS — L400 Psoriasis vulgaris: Secondary | ICD-10-CM | POA: Diagnosis not present

## 2019-03-08 DIAGNOSIS — L920 Granuloma annulare: Secondary | ICD-10-CM | POA: Diagnosis not present

## 2019-03-08 DIAGNOSIS — M25511 Pain in right shoulder: Secondary | ICD-10-CM | POA: Diagnosis not present

## 2019-03-08 DIAGNOSIS — M150 Primary generalized (osteo)arthritis: Secondary | ICD-10-CM | POA: Diagnosis not present

## 2019-03-08 DIAGNOSIS — M2550 Pain in unspecified joint: Secondary | ICD-10-CM | POA: Diagnosis not present

## 2019-03-08 DIAGNOSIS — M7501 Adhesive capsulitis of right shoulder: Secondary | ICD-10-CM | POA: Diagnosis not present

## 2019-03-08 DIAGNOSIS — Z6829 Body mass index (BMI) 29.0-29.9, adult: Secondary | ICD-10-CM | POA: Diagnosis not present

## 2019-03-08 DIAGNOSIS — M545 Low back pain: Secondary | ICD-10-CM | POA: Diagnosis not present

## 2019-03-08 DIAGNOSIS — L409 Psoriasis, unspecified: Secondary | ICD-10-CM | POA: Diagnosis not present

## 2019-03-08 DIAGNOSIS — E663 Overweight: Secondary | ICD-10-CM | POA: Diagnosis not present

## 2019-03-08 DIAGNOSIS — M25512 Pain in left shoulder: Secondary | ICD-10-CM | POA: Diagnosis not present

## 2019-03-13 NOTE — Progress Notes (Signed)
[facility]  [address], [address] [place], [place] [address] Phone: [phone]  Fax: [phone]   Clinic Day:  03/14/2019  Referring physician: Elby Beck, FNP  Chief Complaint: Kimberly Hartman is a 68 y.o. female with psoriasis vulgaris who is seen for 4 month follow-up on Ilumya (tildrakizumab).  HPI: The patient was last seen in the clinic on 11/22/2018. At that time, she had psoriasis on her feet with occasional breakouts on her hands.  She received Ilumya (tildrakizumab) on 11/04/2018 and 12/20/2018.  Colonoscopy on 02/18/2019 revealed one 5 mm polyp in the transverse colon. There were six 4 to 8 mm polyps in the rectum, in the sigmoid colon, in the descending colon and in the transverse colon. There was moderate diverticulosis in the left colon. Purulent discharge was seen in association with the diverticular opening, suspicious of diverticulitis. The examination was otherwise normal. Pathology revealed tubular adenomas.   During the interim, she has noted a great improvement in her psoriasis.  The psoriasis on her heels is improving and is resolved on her hands. She was treated for diverticulitis with antibiotics.  She denies any other infections. Her weight is down 4 lbs, which she attributes to taking walks with her dog. She reports being extremely active, and is cautious during pandemic. She reports taking 2 weeks off from work due to COVID-19.  She has followed up with her dermatologist and her next appointment is in 5 months.   Past Medical History:  Diagnosis Date  . Anxiety   . Arthritis   . Cataract   . Diabetes mellitus without complication ([place])   . Psoriasis   . Thyroid disease     Past Surgical History:  Procedure Laterality Date  . ABDOMINAL HYSTERECTOMY  2001  . ABDOMINAL SURGERY    . ADENOIDECTOMY  1956  . APPENDECTOMY  1967  . BREAST EXCISIONAL BIOPSY Right    benign  . DENTAL SURGERY    . EYE SURGERY  2018   Cataract   . KNEE ARTHROSCOPY WITH MENISCAL REPAIR Right     Family History  Problem Relation Age of Onset  . Cancer Mother   . Cancer Father   . Esophageal cancer Father   . Stroke Maternal Grandmother   . Colon cancer Neg Hx   . Rectal cancer Neg Hx   . Stomach cancer Neg Hx     Social History:  reports that she has been smoking cigarettes. She has been smoking about 0.50 packs per day. She has never used smokeless tobacco. She reports current alcohol use. She reports that she does not use drugs. She recently got a new rescue dog. The patient is alone today.  Allergies:  Allergies  Allergen Reactions  . Chantix [Varenicline Tartrate]   . Metformin And Related   . Neosporin Plus Max St Dermatitis, Itching, Rash and Swelling  . Penicillins Rash    TOLERATED KEFLEX WITH NO ISSUES  . Tape Dermatitis and Rash    THE ADHESIVE PART NOT THE ACTUAL TAPE  . Tetracyclines & Related Rash  . Wellbutrin [Bupropion] Rash    Current Medications: Current Outpatient Medications  Medication Sig Dispense Refill  . acetaminophen (TYLENOL) 325 MG tablet Take 650 mg by mouth every 6 (six) hours as needed.    Marland Kitchen atorvastatin (LIPITOR) 20 MG tablet Take 1 tablet (20 mg total) by mouth daily. 90 tablet 3  . augmented betamethasone dipropionate (DIPROLENE-AF) 0.05 % ointment Apply 1 application topically at bedtime.    Marland Kitchen  Calcium Carbonate-Vitamin D (CALCIUM-D) 600-400 MG-UNIT TABS Take 2 tablets by mouth daily.    . celecoxib (CELEBREX) 200 MG capsule Take 200 mg by mouth as needed.     . cetirizine (ZYRTEC) 10 MG tablet Take 10 mg by mouth daily as needed for allergies.    . Cholecalciferol (VITAMIN D3) 1000 units CAPS Take 1 capsule by mouth daily.    . clonazePAM (KLONOPIN) 0.5 MG tablet Take 1 tablet (0.5 mg total) by mouth daily as needed for anxiety. 90 tablet 1  . diphenhydrAMINE (BENADRYL) 25 MG tablet Take 25 mg by mouth daily as needed.    . fluticasone (FLONASE) 50 MCG/ACT nasal spray Place into  both nostrils daily as needed for allergies or rhinitis.    Marland Kitchen glucose blood (ONE TOUCH ULTRA TEST) test strip Use to check blood sugar once a day. 100 each 3  . JANUVIA 100 MG tablet Take 1 tablet (100 mg total) by mouth daily. 90 tablet 3  . levothyroxine (SYNTHROID, LEVOTHROID) 100 MCG tablet Take 1 tablet (100 mcg total) by mouth every morning. 90 tablet 3  . Multiple Vitamins-Minerals (MULTIVITAMIN ADULTS 50+) TABS Take 1 tablet by mouth daily.    . pioglitazone (ACTOS) 15 MG tablet Take 1 tablet (15 mg total) by mouth daily. 90 tablet 3  . Tildrakizumab-asmn (ILUMYA [place]) Inject into the skin. One injection q 4 weeks x 2 , then every 12 weeks    . Ibuprofen 200 MG CAPS Take 2 tablets by mouth daily as needed.     No current facility-administered medications for this visit.     Review of Systems  Constitutional: Positive for weight loss (down 4 lbs.). Negative for chills, diaphoresis, fever and malaise/fatigue.       Doing well.  HENT: Negative for congestion, hearing loss, sinus pain and sore throat.   Eyes: Negative for blurred vision.  Respiratory: Negative for cough, sputum production and shortness of breath.   Cardiovascular: Negative for chest pain, palpitations, orthopnea and leg swelling.  Gastrointestinal: Negative for abdominal pain, blood in stool, constipation, diarrhea, heartburn, melena, nausea and vomiting.  Genitourinary: Negative for dysuria, frequency, hematuria and urgency.  Musculoskeletal: Negative for back pain, joint pain and myalgias.  Skin: Positive for rash (psoriasis, improving).  Neurological: Negative for dizziness, tingling, sensory change, weakness and headaches.  Endo/Heme/Allergies: Does not bruise/bleed easily.  Psychiatric/Behavioral: Negative for depression and memory loss. The patient is not nervous/anxious and does not have insomnia.   All other systems reviewed and are negative.  Performance status (ECOG): 0  Vitals: Blood pressure 121/73, pulse  68, temperature (!) 97 F (36.1 C), temperature source Tympanic, resp. rate 16, weight 168 lb 6.9 oz (76.4 kg).  Physical Exam  Constitutional: She is oriented to person, place, and time. She appears well-developed and well-nourished. No distress.  HENT:  Head: Normocephalic and atraumatic.  Mouth/Throat: Oropharynx is clear and moist. No oropharyngeal exudate.  Short graying hair.  Eyes: Pupils are equal, round, and reactive to light. Conjunctivae and EOM are normal. No scleral icterus.  Blue rimmed glasses.  Brown eyes.  Neck: Normal range of motion. Neck supple.  Cardiovascular: Normal rate, regular rhythm and normal heart sounds.  No murmur heard. Pulmonary/Chest: Effort normal and breath sounds normal. No respiratory distress. She has no wheezes.  Abdominal: Soft. Bowel sounds are normal. She exhibits no distension. There is no abdominal tenderness.  Musculoskeletal: Normal range of motion.        General: No edema.  Lymphadenopathy:  She has no cervical adenopathy.    She has no axillary adenopathy.       Right: No supraclavicular adenopathy present.       Left: No supraclavicular adenopathy present.  Neurological: She is alert and oriented to person, place, and time.  Skin: Skin is warm and dry. She is not diaphoretic. No erythema.  Dramatic improvement in feet/heels.  Psychiatric: She has a normal mood and affect. Her behavior is normal. Judgment and thought content normal.  Nursing note and vitals reviewed.    No visits with results within 3 Day(s) from this visit.  Latest known visit with results is:  Clinical Support on 10/22/2018  Component Date Value Ref Range Status  . Microalb, Ur 10/22/2018 3.2* 0.0 - 1.9 mg/dL Final  . Creatinine,U 10/22/2018 224.2  mg/dL Final  . Microalb Creat Ratio 10/22/2018 1.4  0.0 - 30.0 mg/g Final  . Hgb A1c MFr Bld 10/22/2018 7.3* 4.6 - 6.5 % Final   Glycemic Control Guidelines for People with Diabetes:Non Diabetic:  <6%Goal of  Therapy: <7%Additional Action Suggested:  >8%   . Cholesterol 10/22/2018 139  0 - 200 mg/dL Final   ATP III Classification       Desirable:  < 200 mg/dL               Borderline High:  200 - 239 mg/dL          High:  > = 240 mg/dL  . Triglycerides 10/22/2018 103.0  0.0 - 149.0 mg/dL Final   Normal:  <150 mg/dLBorderline High:  150 - 199 mg/dL  . HDL 10/22/2018 41.60  >39.00 mg/dL Final  . VLDL 10/22/2018 20.6  0.0 - 40.0 mg/dL Final  . LDL Cholesterol 10/22/2018 77  0 - 99 mg/dL Final  . Total CHOL/HDL Ratio 10/22/2018 3   Final                  Men          Women1/2 Average Risk     3.4          3.3Average Risk          5.0          4.42X Average Risk          9.6          7.13X Average Risk          15.0          11.0                      . NonHDL 10/22/2018 97.12   Final   NOTE:  Non-HDL goal should be 30 mg/dL higher than patient's LDL goal (i.e. LDL goal of < 70 mg/dL, would have non-HDL goal of < 100 mg/dL)  . TSH 10/22/2018 4.45  0.35 - 4.50 uIU/mL Final    Assessment:  Kimberly Hartman is a 68 y.o. female with psoriasis vulgaris.  She developed psoriasis in her 59s.  Psoriasis mainly involves her feet and causes difficulty with ambulation (pain).  She has received Humira, Embril, [place], [place], "every topical medication", PUVA, and laser treatments.  She is on Ilumya (tildrakizumab).  Labs on 09/19/2018 revealed a hematocrit of 43.1, hemoglobin 13.6, MCV 88, platelets 351,000, WBC 10,000 with an ANC of 5200.  Creatinine was 0.76.  LFTs were normal.  Negative studies included:  quantiferon gold , hepatitis B surface antibody, hepatitis B surface antigen, hepatitis B core antibody total,  hepatitis C antibody, and HIV testing.  She has received Ilumya (11/22/2018 and 12/20/2018).  Symptomatically, she is doing well.  She has had a dramatic response to treatment.  Plan: 1.  Psoriasis             Clinically, she is doing well.  Psoriasis has dramatically improved.  She received Ilumya 100  mg SQ on week 0 and week 4.  Begin every 12 week maintenance today. 2.   Ilumya today. 3.   RTC in 12 weeks for MD assessment and Ilumya.  I discussed the assessment and treatment plan with the patient.  The patient was provided an opportunity to ask questions and all were answered.  The patient agreed with the plan and demonstrated an understanding of the instructions.  The patient was advised to call back if the symptoms worsen or if the condition fails to improve as anticipated.   Lequita Asal, MD, PhD    03/14/2019, 10:21 AM  I, Molly Dorshimer, am acting as Education administrator for [employer]. Mike Gip, MD, PhD.  I,  C. Mike Gip, MD, have reviewed the above documentation for accuracy and completeness, and I agree with the above.

## 2019-03-14 ENCOUNTER — Other Ambulatory Visit: Payer: Self-pay

## 2019-03-14 ENCOUNTER — Inpatient Hospital Stay: Payer: Medicare Other | Attending: Hematology and Oncology | Admitting: Hematology and Oncology

## 2019-03-14 ENCOUNTER — Inpatient Hospital Stay: Payer: Medicare Other

## 2019-03-14 VITALS — BP 121/73 | HR 68 | Temp 97.0°F | Resp 16 | Wt 168.4 lb

## 2019-03-14 DIAGNOSIS — Z79899 Other long term (current) drug therapy: Secondary | ICD-10-CM | POA: Insufficient documentation

## 2019-03-14 DIAGNOSIS — L400 Psoriasis vulgaris: Secondary | ICD-10-CM

## 2019-03-14 MED ORDER — TILDRAKIZUMAB-ASMN 100 MG/ML ~~LOC~~ SOSY
100.0000 mg | PREFILLED_SYRINGE | Freq: Once | SUBCUTANEOUS | Status: AC
  Administered 2019-03-14: 100 mg via SUBCUTANEOUS
  Filled 2019-03-14: qty 1

## 2019-03-14 NOTE — Progress Notes (Signed)
Pt here for follow up. Denies any concerns.  

## 2019-03-20 ENCOUNTER — Ambulatory Visit: Payer: Medicare Other

## 2019-03-20 ENCOUNTER — Ambulatory Visit: Payer: Medicare Other | Admitting: Hematology and Oncology

## 2019-03-21 ENCOUNTER — Ambulatory Visit: Payer: Medicare Other | Admitting: Hematology and Oncology

## 2019-03-21 ENCOUNTER — Ambulatory Visit: Payer: Medicare Other

## 2019-04-10 DIAGNOSIS — H1859 Other hereditary corneal dystrophies: Secondary | ICD-10-CM | POA: Diagnosis not present

## 2019-04-10 DIAGNOSIS — H524 Presbyopia: Secondary | ICD-10-CM | POA: Diagnosis not present

## 2019-04-10 DIAGNOSIS — H04123 Dry eye syndrome of bilateral lacrimal glands: Secondary | ICD-10-CM | POA: Diagnosis not present

## 2019-04-10 DIAGNOSIS — H52203 Unspecified astigmatism, bilateral: Secondary | ICD-10-CM | POA: Diagnosis not present

## 2019-04-10 LAB — HM DIABETES EYE EXAM

## 2019-04-17 ENCOUNTER — Encounter: Payer: Self-pay | Admitting: Family Medicine

## 2019-04-27 ENCOUNTER — Encounter: Payer: Self-pay | Admitting: Hematology and Oncology

## 2019-05-06 ENCOUNTER — Ambulatory Visit (INDEPENDENT_AMBULATORY_CARE_PROVIDER_SITE_OTHER): Payer: Medicare Other | Admitting: Family Medicine

## 2019-05-06 ENCOUNTER — Encounter: Payer: Self-pay | Admitting: Family Medicine

## 2019-05-06 ENCOUNTER — Other Ambulatory Visit: Payer: Self-pay

## 2019-05-06 VITALS — BP 118/70 | HR 75 | Temp 98.0°F | Ht 64.0 in | Wt 171.4 lb

## 2019-05-06 DIAGNOSIS — E039 Hypothyroidism, unspecified: Secondary | ICD-10-CM

## 2019-05-06 DIAGNOSIS — F5104 Psychophysiologic insomnia: Secondary | ICD-10-CM | POA: Diagnosis not present

## 2019-05-06 DIAGNOSIS — F419 Anxiety disorder, unspecified: Secondary | ICD-10-CM

## 2019-05-06 DIAGNOSIS — R233 Spontaneous ecchymoses: Secondary | ICD-10-CM

## 2019-05-06 DIAGNOSIS — R238 Other skin changes: Secondary | ICD-10-CM

## 2019-05-06 DIAGNOSIS — E119 Type 2 diabetes mellitus without complications: Secondary | ICD-10-CM | POA: Diagnosis not present

## 2019-05-06 LAB — COMPREHENSIVE METABOLIC PANEL
ALT: 16 U/L (ref 0–35)
AST: 17 U/L (ref 0–37)
Albumin: 4.2 g/dL (ref 3.5–5.2)
Alkaline Phosphatase: 73 U/L (ref 39–117)
BUN: 19 mg/dL (ref 6–23)
CO2: 30 mEq/L (ref 19–32)
Calcium: 10 mg/dL (ref 8.4–10.5)
Chloride: 102 mEq/L (ref 96–112)
Creatinine, Ser: 0.66 mg/dL (ref 0.40–1.20)
GFR: 88.82 mL/min (ref >60.00)
Glucose, Bld: 113 mg/dL — ABNORMAL HIGH (ref 70–99)
Potassium: 5 mEq/L (ref 3.5–5.1)
Sodium: 138 mEq/L (ref 135–145)
Total Bilirubin: 0.4 mg/dL (ref 0.2–1.2)
Total Protein: 7.2 g/dL (ref 6.0–8.3)

## 2019-05-06 LAB — CBC WITH DIFFERENTIAL/PLATELET
Basophils Absolute: 0.1 10*3/uL (ref 0.0–0.1)
Basophils Relative: 0.7 % (ref 0.0–3.0)
Eosinophils Absolute: 0.1 10*3/uL (ref 0.0–0.7)
Eosinophils Relative: 0.8 % (ref 0.0–5.0)
HCT: 42.6 % (ref 36.0–46.0)
Hemoglobin: 13.7 g/dL (ref 12.0–15.0)
Lymphocytes Relative: 30.3 % (ref 12.0–46.0)
Lymphs Abs: 3.1 10*3/uL (ref 0.7–4.0)
MCHC: 32.1 g/dL (ref 30.0–36.0)
MCV: 87 fl (ref 78.0–100.0)
Monocytes Absolute: 0.7 10*3/uL (ref 0.1–1.0)
Monocytes Relative: 6.7 % (ref 3.0–12.0)
Neutro Abs: 6.3 10*3/uL (ref 1.4–7.7)
Neutrophils Relative %: 61.5 % (ref 43.0–77.0)
Platelets: 339 10*3/uL (ref 150.0–400.0)
RBC: 4.89 Mil/uL (ref 3.87–5.11)
RDW: 14.7 % (ref 11.5–15.5)
WBC: 10.2 10*3/uL (ref 4.0–10.5)

## 2019-05-06 LAB — HEMOGLOBIN A1C: Hgb A1c MFr Bld: 7.3 % — ABNORMAL HIGH (ref 4.6–6.5)

## 2019-05-06 LAB — TSH: TSH: 1.3 u[IU]/mL (ref 0.35–4.50)

## 2019-05-06 MED ORDER — CLONAZEPAM 0.5 MG PO TABS: 0.5000 mg | ORAL_TABLET | Freq: Every day | ORAL | 1 refills | Status: DC | PRN

## 2019-05-06 NOTE — Patient Instructions (Addendum)
Sustained release melatonin- Remfresh  If no improvement in 1 month, let me know, will consider Trazodone.    Begin a Mindfulness/Meditation practice -- this can take a little as 3 minutes and is helpful for all kinds of mood issues -- You can find resources in books -- Or you can download apps like  ---- Headspace App (which currently has free content called "Weathering the Storm") ---- Calm (which has a few free options)  ---- Insignt Timer ---- Stop, Breathe & Think  # With each of these Apps - you should decline the "start free trial" offer and as you search through the App should be able to access some of their free content. You can also chose to pay for the content if you find one that works well for you.   # Many of them also offer sleep specific content which may help with insomnia  Healthy Diet -- Avoid or decrease Caffeine -- Avoid or decrease Alcohol -- Drink plenty of water, have a balanced diet -- Avoid cigarettes and marijuana (as well as other recreational drugs)

## 2019-05-06 NOTE — Progress Notes (Signed)
Subjective:    Patient ID: Kimberly Hartman, female    DOB: [birth year], 68 y.o.   MRN: [id number]  HPI This is a 68 yo female who presents today for routine follow up and to discuss insomnia.   Walking with new dog, 3-4x day. Continues to work at [employer] several days a week.   Easy bruising, skin tears. Not sure if related to Ilumya that she takes for her psoriasis.   Difficulty falling asleep. Tosses and turns. Takes clonazepam 0.5 mg nightly. Some early awakening. Has 1-2 cups coffee in am, no caffeine after. Occasional naps. Watches TV before bed, no computer or phone. No pain. Occasionally has to urinate. She is interested in trying melatonin but wanted to see if ok with her other meds. Has tried meditation, finds it hard to turn off her mind.   Past Medical History:  Diagnosis Date  . Anxiety   . Arthritis   . Cataract   . Diabetes mellitus without complication ([place])   . Psoriasis   . Thyroid disease    Past Surgical History:  Procedure Laterality Date  . ABDOMINAL HYSTERECTOMY  2001  . ABDOMINAL SURGERY    . ADENOIDECTOMY  1956  . APPENDECTOMY  1967  . BREAST EXCISIONAL BIOPSY Right    benign  . DENTAL SURGERY    . EYE SURGERY  2018   Cataract  . KNEE ARTHROSCOPY WITH MENISCAL REPAIR Right    Family History  Problem Relation Age of Onset  . Cancer Mother   . Cancer Father   . Esophageal cancer Father   . Stroke Maternal Grandmother   . Colon cancer Neg Hx   . Rectal cancer Neg Hx   . Stomach cancer Neg Hx    Social History   Tobacco Use  . Smoking status: Current Every Day Smoker    Packs/day: 0.50    Types: Cigarettes  . Smokeless tobacco: Never Used  Substance Use Topics  . Alcohol use: Yes    Comment: occ  . Drug use: Never      Review of Systems Per HPI    Objective:   Physical Exam Vitals signs reviewed.  Constitutional:      General: She is not in acute distress.    Appearance: Normal appearance. She is normal weight. She is not  ill-appearing, toxic-appearing or diaphoretic.  HENT:     Head: Normocephalic and atraumatic.  Neurological:     Mental Status: She is alert.       BP 118/70 (BP Location: Left Arm, Patient Position: Sitting, Cuff Size: Normal)   Pulse 75   Temp 98 F (36.7 C) (Temporal)   Ht 5\' 4"  (1.626 m)   Wt 171 lb 6.4 oz (77.7 kg)   SpO2 96%   BMI 29.42 kg/m  Wt Readings from Last 3 Encounters:  05/06/19 171 lb 6.4 oz (77.7 kg)  03/14/19 168 lb 6.9 oz (76.4 kg)  02/18/19 172 lb (78 kg)       Assessment & Plan:  1. Controlled type 2 diabetes mellitus without complication, without long-term current use of insulin (HCC) - Hemoglobin A1c - CBC with Differential - Comprehensive metabolic panel; Future - Comprehensive metabolic panel  2. Acquired hypothyroidism - TSH  3. Easy bruising - CBC with Differential - Comprehensive metabolic panel; Future - Comprehensive metabolic panel  4. Anxiety - discussed non medication methods for dealing with anxiety - discussed dependence and tolerance with long term benzodiazapine use and encouraged her to use sparingly.  -  clonazePAM (KLONOPIN) 0.5 MG tablet; Take 1 tablet (0.5 mg total) by mouth daily as needed for anxiety.  Dispense: 90 tablet; Refill: 1  5. Psychophysiological insomnia - she will try melatonin and discussed additional sleep hygiene strategies - if no improvement in a couple of weeks will consider adding trazodone.    Clarene Reamer, FNP-BC  [facility]  05/07/2019 2:49 PM

## 2019-05-07 ENCOUNTER — Encounter: Payer: Self-pay | Admitting: Family Medicine

## 2019-05-26 ENCOUNTER — Other Ambulatory Visit: Payer: Self-pay

## 2019-05-26 ENCOUNTER — Emergency Department: Payer: Medicare Other

## 2019-05-26 ENCOUNTER — Emergency Department
Admission: EM | Admit: 2019-05-26 | Discharge: 2019-05-26 | Disposition: A | Discharge status: Home / Self Care | Payer: Medicare Other | Attending: Student in an Organized Health Care Education/Training Program | Admitting: Student in an Organized Health Care Education/Training Program

## 2019-05-26 DIAGNOSIS — E039 Hypothyroidism, unspecified: Secondary | ICD-10-CM | POA: Insufficient documentation

## 2019-05-26 DIAGNOSIS — Z79899 Other long term (current) drug therapy: Secondary | ICD-10-CM | POA: Insufficient documentation

## 2019-05-26 DIAGNOSIS — Z7984 Long term (current) use of oral hypoglycemic drugs: Secondary | ICD-10-CM | POA: Diagnosis not present

## 2019-05-26 DIAGNOSIS — E119 Type 2 diabetes mellitus without complications: Secondary | ICD-10-CM | POA: Insufficient documentation

## 2019-05-26 DIAGNOSIS — F17210 Nicotine dependence, cigarettes, uncomplicated: Secondary | ICD-10-CM | POA: Diagnosis not present

## 2019-05-26 DIAGNOSIS — M545 Low back pain: Secondary | ICD-10-CM | POA: Diagnosis present

## 2019-05-26 DIAGNOSIS — M5441 Lumbago with sciatica, right side: Secondary | ICD-10-CM | POA: Diagnosis not present

## 2019-05-26 DIAGNOSIS — M5431 Sciatica, right side: Secondary | ICD-10-CM

## 2019-05-26 DIAGNOSIS — M47816 Spondylosis without myelopathy or radiculopathy, lumbar region: Secondary | ICD-10-CM | POA: Diagnosis not present

## 2019-05-26 LAB — URINALYSIS, COMPLETE (UACMP) WITH MICROSCOPIC
Bacteria, UA: NONE SEEN
Bilirubin Urine: NEGATIVE
Glucose, UA: NEGATIVE mg/dL
Hgb urine dipstick: NEGATIVE
Ketones, ur: NEGATIVE mg/dL
Leukocytes,Ua: NEGATIVE
Nitrite: NEGATIVE
Protein, ur: NEGATIVE mg/dL
Specific Gravity, Urine: 1.004 — ABNORMAL LOW (ref 1.005–1.030)
pH: 7 (ref 5.0–8.0)

## 2019-05-26 MED ORDER — CYCLOBENZAPRINE HCL 10 MG PO TABS: 10.0000 mg | ORAL_TABLET | Freq: Three times a day (TID) | ORAL | 0 refills | Status: DC | PRN

## 2019-05-26 MED ORDER — ORPHENADRINE CITRATE 30 MG/ML IJ SOLN
60.0000 mg | Freq: Two times a day (BID) | INTRAMUSCULAR | Status: DC
  Administered 2019-05-26: 60 mg via INTRAMUSCULAR
  Filled 2019-05-26: qty 2

## 2019-05-26 MED ORDER — HYDROMORPHONE HCL 1 MG/ML IJ SOLN
1.0000 mg | Freq: Once | INTRAMUSCULAR | Status: AC
  Administered 2019-05-26: 12:00:00 1 mg via INTRAMUSCULAR
  Filled 2019-05-26: qty 1

## 2019-05-26 MED ORDER — OXYCODONE-ACETAMINOPHEN 7.5-325 MG PO TABS: 1.0000 | ORAL_TABLET | Freq: Four times a day (QID) | ORAL | 0 refills | Status: DC | PRN

## 2019-05-26 NOTE — ED Provider Notes (Signed)
[facility] Emergency Department Provider Note   ____________________________________________   First MD Initiated Contact with Patient 05/26/19 1106     (approximate)  I have reviewed the triage vital signs and the nursing notes.   HISTORY  Chief Complaint Back Pain    HPI Kimberly Hartman is a 68 y.o. female patient complain of 3 days of low back pain with a component to the right lower extremity.  Patient state history of sciatica with normal relief with anti-inflammatory medication and stretching.  Patient also complained of urinary frequency.  Patient denies dysuria or vaginal discharge.  Patient rates her pain as a 6/10.  Patient described pain as "aching".         Past Medical History:  Diagnosis Date   Anxiety    Arthritis    Cataract    Diabetes mellitus without complication ([place])    Psoriasis    Thyroid disease     Patient Active Problem List   Diagnosis Date Noted   Granuloma annulare 11/05/2018   Psoriasis vulgaris 07/24/2018   Primary osteoarthritis of left shoulder 07/24/2018   Bursitis of right shoulder 07/24/2018   Pain in joint of left shoulder 06/07/2018   Diabetes type 2, controlled ([place]) 04/10/2018   Hypothyroid 04/10/2018    Past Surgical History:  Procedure Laterality Date   ABDOMINAL HYSTERECTOMY  2001   [place]   BREAST EXCISIONAL BIOPSY Right    benign   DENTAL SURGERY     EYE SURGERY  2018   Cataract   KNEE ARTHROSCOPY WITH MENISCAL REPAIR Right     Prior to Admission medications   Medication Sig Start Date End Date Taking? Authorizing Provider  acetaminophen (TYLENOL) 325 MG tablet Take 650 mg by mouth every 6 (six) hours as needed.    [provider]  atorvastatin (LIPITOR) 20 MG tablet Take 1 tablet (20 mg total) by mouth daily. 07/06/18   Elby Beck, FNP  augmented betamethasone dipropionate (DIPROLENE-AF) 0.05 %  ointment Apply 1 application topically at bedtime. 01/03/19   [provider]  Calcium Carbonate-Vitamin D (CALCIUM-D) 600-400 MG-UNIT TABS Take 2 tablets by mouth daily.    [provider]  celecoxib (CELEBREX) 200 MG capsule Take 200 mg by mouth as needed.  10/11/18   [provider]  cetirizine (ZYRTEC) 10 MG tablet Take 10 mg by mouth daily as needed for allergies.    [provider]  Cholecalciferol (VITAMIN D3) 1000 units CAPS Take 1 capsule by mouth daily.    [provider]  clonazePAM (KLONOPIN) 0.5 MG tablet Take 1 tablet (0.5 mg total) by mouth daily as needed for anxiety. 05/06/19   Elby Beck, FNP  cyclobenzaprine (FLEXERIL) 10 MG tablet Take 1 tablet (10 mg total) by mouth 3 (three) times daily as needed. 05/26/19   Sable Feil, PA-C  diphenhydrAMINE (BENADRYL) 25 MG tablet Take 25 mg by mouth daily as needed.    [provider]  fluticasone (FLONASE) 50 MCG/ACT nasal spray Place into both nostrils daily as needed for allergies or rhinitis.    [provider]  glucose blood (ONE TOUCH ULTRA TEST) test strip Use to check blood sugar once a day. 04/30/18   Elby Beck, FNP  Ibuprofen 200 MG CAPS Take 2 tablets by mouth daily as needed.    [provider]  JANUVIA 100 MG tablet Take 1 tablet (100 mg total)  by mouth daily. 08/15/18   Elby Beck, FNP  levothyroxine (SYNTHROID, LEVOTHROID) 100 MCG tablet Take 1 tablet (100 mcg total) by mouth every morning. 07/06/18   Elby Beck, FNP  Multiple Vitamins-Minerals (MULTIVITAMIN ADULTS 50+) TABS Take 1 tablet by mouth daily.    [provider]  oxyCODONE-acetaminophen (PERCOCET) 7.5-325 MG tablet Take 1 tablet by mouth every 6 (six) hours as needed. 05/26/19   Sable Feil, PA-C  pioglitazone (ACTOS) 15 MG tablet Take 1 tablet (15 mg total) by mouth daily. 01/25/19   Elby Beck, FNP  Tildrakizumab-asmn [facility]) Inject into the  skin. One injection q 4 weeks x 2 , then every 12 weeks    [provider]    Allergies Chantix [varenicline tartrate], Metformin and related, Neosporin plus max st, Penicillins, Tape, Tetracyclines & related, and Wellbutrin [bupropion]  Family History  Problem Relation Age of Onset   Cancer Mother    Cancer Father    Esophageal cancer Father    Stroke Maternal Grandmother    Colon cancer Neg Hx    Rectal cancer Neg Hx    Stomach cancer Neg Hx     Social History Social History   Tobacco Use   Smoking status: Current Every Day Smoker    Packs/day: 0.50    Types: Cigarettes   Smokeless tobacco: Never Used  Substance Use Topics   Alcohol use: Yes    Comment: occ   Drug use: Never    Review of Systems  Constitutional: No fever/chills Eyes: No visual changes. ENT: No sore throat. Cardiovascular: Denies chest pain. Respiratory: Denies shortness of breath. Gastrointestinal: No abdominal pain.  No nausea, no vomiting.  No diarrhea.  No constipation. Genitourinary: Negative for dysuria.  Urinary frequency. Musculoskeletal: Positive for back pain. Skin: Negative for rash. Neurological: Negative for headaches, focal weakness or numbness. Psychiatric:  Anxiety Endocrine:  Diabetes and hypothyroidism. Allergic/Immunilogical: See allergy list.  ____________________________________________   PHYSICAL EXAM:  VITAL SIGNS: ED Triage Vitals  Enc Vitals Group     BP 05/26/19 1056 (!) 147/77     Pulse Rate 05/26/19 1056 70     Resp 05/26/19 1056 16     Temp 05/26/19 1056 98.4 F (36.9 C)     Temp Source 05/26/19 1056 Oral     SpO2 05/26/19 1056 98 %     Weight --      Height --      Head Circumference --      Peak Flow --      Pain Score 05/26/19 1059 6     Pain Loc --      Pain Edu? --      Excl. in [place]? --    Constitutional: Alert and oriented. Well appearing and in no acute distress. Neck: No cervical spine tenderness to  palpation. Cardiovascular: Normal rate, regular rhythm. Grossly normal heart sounds.  Good peripheral circulation. Respiratory: Normal respiratory effort.  No retractions. Lungs CTAB. Gastrointestinal: Soft and nontender. No distention. No abdominal bruits. No CVA tenderness. Genitourinary: Deferred Musculoskeletal: No obvious spinal deformity.  Patient has moderate guarding palpation of L3-S1.  No lower extremity tenderness nor edema.  No joint effusions.  Patient had negative straight leg test in the supine position. Neurologic:  Normal speech and language. No gross focal neurologic deficits are appreciated. No gait instability. Skin:  Skin is warm, dry and intact. No rash noted. Psychiatric: Mood and affect are normal. Speech and behavior are normal.  ____________________________________________  LABS (all labs ordered are listed, but only abnormal results are displayed)  Labs Reviewed  URINALYSIS, COMPLETE (UACMP) WITH MICROSCOPIC - Abnormal; Notable for the following components:      Result Value   Color, Urine STRAW (*)    APPearance CLEAR (*)    Specific Gravity, Urine 1.004 (*)    All other components within normal limits   ____________________________________________  EKG   ____________________________________________  RADIOLOGY  ED MD interpretation:    Official radiology report(s): Dg Lumbar Spine 2-3 Views  Result Date: 05/26/2019 CLINICAL DATA:  Radicular back pain to the right lower extremity EXAM: LUMBAR SPINE - 2-3 VIEW COMPARISON:  09/28/2012 FINDINGS: 5 lumbar-type vertebral bodies. Normal lumbar lordosis. No evidence of fracture or dislocation. Vertebral body heights are maintained. Very mild degenerative changes at T11-12 and L1-L2. Visualized bony pelvis appears intact. IMPRESSION: Negative. Electronically Signed   By: Julian Hy M.D.   On: 05/26/2019 12:05    ____________________________________________   PROCEDURES  Procedure(s) performed  (including Critical Care):  Procedures   ____________________________________________   INITIAL IMPRESSION / ASSESSMENT AND PLAN / ED COURSE  As part of my medical decision making, I reviewed the following data within the [place] was evaluated in Emergency Department on 05/26/2019 for the symptoms described in the history of present illness. She was evaluated in the context of the global COVID-19 pandemic, which necessitated consideration that the patient might be at risk for infection with the SARS-CoV-2 virus that causes COVID-19. Institutional protocols and algorithms that pertain to the evaluation of patients at risk for COVID-19 are in a state of rapid change based on information released by regulatory bodies including the CDC and federal and state organizations. These policies and algorithms were followed during the patient's care in the ED.  Patient presents with radicular back pain to right lower extremity.  Patient also complained of urinary frequency.  Discussed x-ray as well as with patient.  Patient given discharge care instructions.  Patient will follow orthopedics for her back complaint.  Patient follow-up with her family doctor with the onset of urinary urgency since lab results are unremarkable.      ____________________________________________   FINAL CLINICAL IMPRESSION(S) / ED DIAGNOSES  Final diagnoses:  Sciatica of right side     ED Discharge Orders         Ordered    oxyCODONE-acetaminophen (PERCOCET) 7.5-325 MG tablet  Every 6 hours PRN     05/26/19 1215    cyclobenzaprine (FLEXERIL) 10 MG tablet  3 times daily PRN     05/26/19 1215           Note:  This document was prepared using Dragon voice recognition software and may include unintentional dictation errors.    Sable Feil, PA-C 05/26/19 1239    Merlyn Lot, MD 05/26/19 [phone]

## 2019-05-26 NOTE — ED Notes (Signed)
Pt with right lower back pain radiating down right leg. Pt states she is having trouble walking. No swelling or redness noted. Denies burning with urination. Pt states she is urinating more than normal. No relief from pain with hear or ibuprofen.

## 2019-05-26 NOTE — ED Triage Notes (Signed)
Pt states that Thursday she started having rt sided back pain that is radiating down her leg, states that she has attempted to stretch without relief, denies pain or burning with urination, but does report frequency

## 2019-05-29 ENCOUNTER — Other Ambulatory Visit: Payer: Self-pay | Admitting: Family Medicine

## 2019-05-29 DIAGNOSIS — E039 Hypothyroidism, unspecified: Secondary | ICD-10-CM

## 2019-05-31 DIAGNOSIS — M5441 Lumbago with sciatica, right side: Secondary | ICD-10-CM | POA: Diagnosis not present

## 2019-05-31 DIAGNOSIS — M545 Low back pain: Secondary | ICD-10-CM | POA: Diagnosis not present

## 2019-06-04 DIAGNOSIS — M5416 Radiculopathy, lumbar region: Secondary | ICD-10-CM | POA: Diagnosis not present

## 2019-06-06 ENCOUNTER — Ambulatory Visit: Payer: Medicare Other

## 2019-06-06 ENCOUNTER — Ambulatory Visit: Payer: Medicare Other | Admitting: Hematology and Oncology

## 2019-06-07 DIAGNOSIS — M5416 Radiculopathy, lumbar region: Secondary | ICD-10-CM | POA: Diagnosis not present

## 2019-06-11 DIAGNOSIS — M5416 Radiculopathy, lumbar region: Secondary | ICD-10-CM | POA: Diagnosis not present

## 2019-06-14 DIAGNOSIS — M5416 Radiculopathy, lumbar region: Secondary | ICD-10-CM | POA: Diagnosis not present

## 2019-06-18 DIAGNOSIS — M5416 Radiculopathy, lumbar region: Secondary | ICD-10-CM | POA: Diagnosis not present

## 2019-06-20 DIAGNOSIS — L400 Psoriasis vulgaris: Secondary | ICD-10-CM | POA: Diagnosis not present

## 2019-06-25 DIAGNOSIS — M5416 Radiculopathy, lumbar region: Secondary | ICD-10-CM | POA: Diagnosis not present

## 2019-06-27 DIAGNOSIS — M5416 Radiculopathy, lumbar region: Secondary | ICD-10-CM | POA: Diagnosis not present

## 2019-06-27 DIAGNOSIS — L920 Granuloma annulare: Secondary | ICD-10-CM | POA: Diagnosis not present

## 2019-06-27 DIAGNOSIS — L400 Psoriasis vulgaris: Secondary | ICD-10-CM | POA: Diagnosis not present

## 2019-07-02 DIAGNOSIS — L400 Psoriasis vulgaris: Secondary | ICD-10-CM | POA: Diagnosis not present

## 2019-07-12 DIAGNOSIS — Z23 Encounter for immunization: Secondary | ICD-10-CM | POA: Diagnosis not present

## 2019-07-16 ENCOUNTER — Other Ambulatory Visit: Payer: Self-pay | Admitting: Family Medicine

## 2019-07-16 DIAGNOSIS — E119 Type 2 diabetes mellitus without complications: Secondary | ICD-10-CM

## 2019-08-15 DIAGNOSIS — L400 Psoriasis vulgaris: Secondary | ICD-10-CM | POA: Diagnosis not present

## 2019-08-27 ENCOUNTER — Other Ambulatory Visit: Payer: Self-pay | Admitting: Family Medicine

## 2019-08-27 DIAGNOSIS — E039 Hypothyroidism, unspecified: Secondary | ICD-10-CM

## 2019-09-10 DIAGNOSIS — L920 Granuloma annulare: Secondary | ICD-10-CM | POA: Diagnosis not present

## 2019-09-10 DIAGNOSIS — M25511 Pain in right shoulder: Secondary | ICD-10-CM | POA: Diagnosis not present

## 2019-09-10 DIAGNOSIS — M150 Primary generalized (osteo)arthritis: Secondary | ICD-10-CM | POA: Diagnosis not present

## 2019-09-10 DIAGNOSIS — M545 Low back pain: Secondary | ICD-10-CM | POA: Diagnosis not present

## 2019-09-10 DIAGNOSIS — Z6829 Body mass index (BMI) 29.0-29.9, adult: Secondary | ICD-10-CM | POA: Diagnosis not present

## 2019-09-10 DIAGNOSIS — M2550 Pain in unspecified joint: Secondary | ICD-10-CM | POA: Diagnosis not present

## 2019-09-10 DIAGNOSIS — M25512 Pain in left shoulder: Secondary | ICD-10-CM | POA: Diagnosis not present

## 2019-09-10 DIAGNOSIS — L409 Psoriasis, unspecified: Secondary | ICD-10-CM | POA: Diagnosis not present

## 2019-09-10 DIAGNOSIS — M7501 Adhesive capsulitis of right shoulder: Secondary | ICD-10-CM | POA: Diagnosis not present

## 2019-09-10 DIAGNOSIS — E663 Overweight: Secondary | ICD-10-CM | POA: Diagnosis not present

## 2019-10-10 DIAGNOSIS — L309 Dermatitis, unspecified: Secondary | ICD-10-CM | POA: Diagnosis not present

## 2019-10-10 DIAGNOSIS — L400 Psoriasis vulgaris: Secondary | ICD-10-CM | POA: Diagnosis not present

## 2019-10-14 DIAGNOSIS — Z20828 Contact with and (suspected) exposure to other viral communicable diseases: Secondary | ICD-10-CM | POA: Diagnosis not present

## 2019-10-21 ENCOUNTER — Other Ambulatory Visit: Payer: Self-pay | Admitting: Family Medicine

## 2019-10-21 DIAGNOSIS — E113299 Type 2 diabetes mellitus with mild nonproliferative diabetic retinopathy without macular edema, unspecified eye: Secondary | ICD-10-CM

## 2019-10-21 DIAGNOSIS — R739 Hyperglycemia, unspecified: Secondary | ICD-10-CM

## 2019-10-23 ENCOUNTER — Other Ambulatory Visit: Payer: Self-pay | Admitting: Family Medicine

## 2019-10-23 DIAGNOSIS — F419 Anxiety disorder, unspecified: Secondary | ICD-10-CM

## 2019-10-25 ENCOUNTER — Other Ambulatory Visit (INDEPENDENT_AMBULATORY_CARE_PROVIDER_SITE_OTHER): Payer: Medicare Other

## 2019-10-25 ENCOUNTER — Other Ambulatory Visit: Payer: Self-pay

## 2019-10-25 ENCOUNTER — Ambulatory Visit: Payer: Medicare Other

## 2019-10-25 ENCOUNTER — Ambulatory Visit (INDEPENDENT_AMBULATORY_CARE_PROVIDER_SITE_OTHER): Payer: Medicare Other

## 2019-10-25 VITALS — Wt 170.0 lb

## 2019-10-25 DIAGNOSIS — R739 Hyperglycemia, unspecified: Secondary | ICD-10-CM

## 2019-10-25 DIAGNOSIS — L400 Psoriasis vulgaris: Secondary | ICD-10-CM | POA: Diagnosis not present

## 2019-10-25 DIAGNOSIS — E113299 Type 2 diabetes mellitus with mild nonproliferative diabetic retinopathy without macular edema, unspecified eye: Secondary | ICD-10-CM | POA: Diagnosis not present

## 2019-10-25 DIAGNOSIS — Z0000 Encounter for general adult medical examination without abnormal findings: Secondary | ICD-10-CM | POA: Diagnosis not present

## 2019-10-25 DIAGNOSIS — Z79899 Other long term (current) drug therapy: Secondary | ICD-10-CM | POA: Diagnosis not present

## 2019-10-25 LAB — LIPID PANEL
Cholesterol: 141 mg/dL (ref 0–200)
HDL: 49.4 mg/dL (ref >39.00)
LDL Cholesterol: 80 mg/dL (ref 0–99)
NonHDL: 91.6
Total CHOL/HDL Ratio: 3
Triglycerides: 57 mg/dL (ref 0.0–149.0)
VLDL: 11.4 mg/dL (ref 0.0–40.0)

## 2019-10-25 LAB — MICROALBUMIN / CREATININE URINE RATIO
Creatinine,U: 155.3 mg/dL
Microalb Creat Ratio: 2.5 mg/g (ref 0.0–30.0)
Microalb, Ur: 3.9 mg/dL — ABNORMAL HIGH (ref 0.0–1.9)

## 2019-10-25 LAB — HEMOGLOBIN A1C: Hgb A1c MFr Bld: 7.5 % — ABNORMAL HIGH (ref 4.6–6.5)

## 2019-10-25 NOTE — Progress Notes (Signed)
Subjective:   Kimberly Hartman is a 69 y.o. female who presents for Medicare Annual (Subsequent) preventive examination.  Review of Systems: N/A   This visit is being conducted through telemedicine via telephone at the nurse health advisor's home address due to the COVID-19 pandemic. This patient has given me verbal consent via doximity to conduct this visit, patient states they are participating from their home address. Patient and myself are on the telephone call. There is no referral for this visit. Some vital signs may be absent or patient reported.    Patient identification: identified by name, DOB, and current address   Cardiac Risk Factors include: advanced age (>57men, >6 women);diabetes mellitus;dyslipidemia;hypertension     Objective:     Vitals: Wt 170 lb (77.1 kg)   BMI 29.18 kg/m   Body mass index is 29.18 kg/m.  Advanced Directives 10/25/2019 05/26/2019 03/14/2019 11/22/2018 10/22/2018 03/19/2018 08/17/2016  Does Patient Have a Medical Advance Directive? Yes Yes Yes Yes Yes No No  Type of Paramedic of [place];Living will [place];Living will [place];Living will [place];Living will [place];Living will - -  Does patient want to make changes to medical advance directive? - - No - Patient declined No - Patient declined - - -  Copy of [place] in Chart? No - copy requested No - copy requested No - copy requested No - copy requested No - copy requested - -  Would patient like information on creating a medical advance directive? - - - - - - No - patient declined information    Tobacco Social History   Tobacco Use  Smoking Status Current Every Day Smoker  . Packs/day: 0.50  . Types: Cigarettes  Smokeless Tobacco Never Used     Ready to quit: Not Answered Counseling given: Not Answered   Clinical Intake:  Pre-visit preparation completed:  Yes  Pain : No/denies pain     Nutritional Risks: None Diabetes: Yes CBG done?: No Did pt. bring in CBG monitor from home?: No  How often do you need to have someone help you when you read instructions, pamphlets, or other written materials from your doctor or pharmacy?: 1 - Never What is the last grade level you completed in school?: bachelors  Interpreter Needed?: No  Information entered by :: CJohnson, LPN  Past Medical History:  Diagnosis Date  . Anxiety   . Arthritis   . Cataract   . Diabetes mellitus without complication ([place])   . Psoriasis   . Thyroid disease    Past Surgical History:  Procedure Laterality Date  . ABDOMINAL HYSTERECTOMY  2001  . ABDOMINAL SURGERY    . ADENOIDECTOMY  1956  . APPENDECTOMY  1967  . BREAST EXCISIONAL BIOPSY Right    benign  . DENTAL SURGERY    . EYE SURGERY  2018   Cataract  . KNEE ARTHROSCOPY WITH MENISCAL REPAIR Right    Family History  Problem Relation Age of Onset  . Cancer Mother   . Cancer Father   . Esophageal cancer Father   . Stroke Maternal Grandmother   . Colon cancer Neg Hx   . Rectal cancer Neg Hx   . Stomach cancer Neg Hx    Social History   Socioeconomic History  . Marital status: Married    Spouse name: Not on file  . Number of children: Not on file  . Years of education: Not on file  .  Highest education level: Not on file  Occupational History  . Not on file  Tobacco Use  . Smoking status: Current Every Day Smoker    Packs/day: 0.50    Types: Cigarettes  . Smokeless tobacco: Never Used  Substance and Sexual Activity  . Alcohol use: Yes    Comment: occ  . Drug use: Never  . Sexual activity: Not on file  Other Topics Concern  . Not on file  Social History Narrative  . Not on file   Social Determinants of Health   Financial Resource Strain: Low Risk   . Difficulty of Paying Living Expenses: Not hard at all  Food Insecurity: No Food Insecurity  . Worried About Charity fundraiser in the  Last Year: Never true  . Ran Out of Food in the Last Year: Never true  Transportation Needs: No Transportation Needs  . Lack of Transportation (Medical): No  . Lack of Transportation (Non-Medical): No  Physical Activity: Inactive  . Days of Exercise per Week: 0 days  . Minutes of Exercise per Session: 0 min  Stress: Stress Concern Present  . Feeling of Stress : To some extent  Social Connections:   . Frequency of Communication with Friends and Family: Not on file  . Frequency of Social Gatherings with Friends and Family: Not on file  . Attends Religious Services: Not on file  . Active Member of Clubs or Organizations: Not on file  . Attends Archivist Meetings: Not on file  . Marital Status: Not on file    Outpatient Encounter Medications as of 10/25/2019  Medication Sig  . acetaminophen (TYLENOL) 325 MG tablet Take 650 mg by mouth every 6 (six) hours as needed.  Marland Kitchen atorvastatin (LIPITOR) 20 MG tablet TAKE 1 TABLET BY MOUTH  DAILY  . Calcium Carbonate-Vitamin D (CALCIUM-D) 600-400 MG-UNIT TABS Take 2 tablets by mouth daily.  . cetirizine (ZYRTEC) 10 MG tablet Take 10 mg by mouth daily as needed for allergies.  . Cholecalciferol (VITAMIN D3) 1000 units CAPS Take 1 capsule by mouth daily.  . clonazePAM (KLONOPIN) 0.5 MG tablet TAKE 1 TABLET BY MOUTH  DAILY AS NEEDED FOR ANXIETY  . diphenhydrAMINE (BENADRYL) 25 MG tablet Take 25 mg by mouth daily as needed.  . fluticasone (FLONASE) 50 MCG/ACT nasal spray Place into both nostrils daily as needed for allergies or rhinitis.  Marland Kitchen glucose blood (ONE TOUCH ULTRA TEST) test strip Use to check blood sugar once a day.  . Ibuprofen 200 MG CAPS Take 2 tablets by mouth daily as needed.  Marland Kitchen JANUVIA 100 MG tablet TAKE 1 TABLET BY MOUTH  DAILY  . levothyroxine (SYNTHROID) 100 MCG tablet TAKE 1 TABLET BY MOUTH IN  THE MORNING  . Multiple Vitamins-Minerals (MULTIVITAMIN ADULTS 50+) TABS Take 1 tablet by mouth daily.  . pioglitazone (ACTOS) 15 MG  tablet Take 1 tablet (15 mg total) by mouth daily.  Marland Kitchen augmented betamethasone dipropionate (DIPROLENE-AF) 0.05 % ointment Apply 1 application topically at bedtime.  . celecoxib (CELEBREX) 200 MG capsule Take 200 mg by mouth as needed.   . cyclobenzaprine (FLEXERIL) 10 MG tablet Take 1 tablet (10 mg total) by mouth 3 (three) times daily as needed.  Marland Kitchen oxyCODONE-acetaminophen (PERCOCET) 7.5-325 MG tablet Take 1 tablet by mouth every 6 (six) hours as needed.  Retia Passe (ILUMYA [place]) Inject into the skin. One injection q 4 weeks x 2 , then every 12 weeks   No facility-administered encounter medications on file as of 10/25/2019.  Activities of Daily Living In your present state of health, do you have any difficulty performing the following activities: 10/25/2019  Hearing? N  Vision? N  Difficulty concentrating or making decisions? N  Walking or climbing stairs? N  Dressing or bathing? N  Doing errands, shopping? N  Preparing Food and eating ? N  Using the Toilet? N  In the past six months, have you accidently leaked urine? Y  Comment wears a pad  Do you have problems with loss of bowel control? N  Managing your Medications? N  Managing your Finances? N  Housekeeping or managing your Housekeeping? N  Some recent data might be hidden    Patient Care Team: Elby Beck, FNP as PCP - General (Nurse Practitioner)    Assessment:   This is a routine wellness examination for [place].  Exercise Activities and Dietary recommendations Current Exercise Habits: Home exercise routine, Type of exercise: walking, Time (Minutes): 30, Frequency (Times/Week): 7, Weekly Exercise (Minutes/Week): 210, Intensity: Mild, Exercise limited by: None identified  Goals    . Patient Stated     Starting 10/22/18, I will continue to walk at least 7000 steps 2-3 days per week.     . Patient Stated     10/25/2019, I will continue to walk my dog everyday for 30 minutes- 1 hour.       Fall Risk Fall  Risk  10/25/2019 10/22/2018 04/06/2018  Falls in the past year? 0 0 No  Number falls in past yr: 0 - -  Injury with Fall? 0 - -  Risk for fall due to : Medication side effect - -  Follow up Falls evaluation completed;Falls prevention discussed - -   Is the patient's home free of loose throw rugs in walkways, pet beds, electrical cords, etc?   yes      Grab bars in the bathroom? no      Handrails on the stairs?   yes      Adequate lighting?   yes  Timed Get Up and Go performed: N/A  Depression Screen PHQ 2/9 Scores 10/25/2019 10/22/2018  PHQ - 2 Score 6 0  PHQ- 9 Score 12 0     Cognitive Function MMSE - Mini Mental State Exam 10/25/2019 10/22/2018  Orientation to time 5 5  Orientation to Place 5 5  Registration 3 3  Attention/ Calculation 5 0  Recall 3 3  Language- name 2 objects - 0  Language- repeat 1 1  Language- follow 3 step command - 3  Language- read & follow direction - 0  Write a sentence - 0  Copy design - 0  Total score - 20  Mini Cog  Mini-Cog screen was completed. Maximum score is 22. A value of 0 denotes this part of the MMSE was not completed or the patient failed this part of the Mini-Cog screening.       Immunization History  Administered Date(s) Administered  . Influenza, High Dose Seasonal PF 07/12/2019  . Influenza,inj,Quad PF,6+ Mos 09/13/2018  . Influenza-Unspecified 09/13/2018  . PPD Test 06/17/2018  . Pneumococcal Conjugate-13 10/19/2015  . Pneumococcal Polysaccharide-23 03/27/2013  . Tdap 09/12/2012  . Zoster Recombinat (Shingrix) 10/18/2017, 06/17/2018    Qualifies for Shingles Vaccine? Completed series  Screening Tests Health Maintenance  Topic Date Due  . FOOT EXAM  06/12/1960  . URINE MICROALBUMIN  10/23/2019  . HEMOGLOBIN A1C  11/06/2019  . MAMMOGRAM  01/24/2020  . OPHTHALMOLOGY EXAM  04/09/2020  . COLONOSCOPY  02/17/2022  .  TETANUS/TDAP  09/12/2022  . INFLUENZA VACCINE  Completed  . DEXA SCAN  Completed  . Hepatitis C Screening   Completed  . PNA vac Low Risk Adult  Completed    Cancer Screenings: Lung: Low Dose CT Chest recommended if Age 67-80 years, 30 pack-year currently smoking OR have quit w/in 15years. Patient does not qualify. Breast:  Up to date on Mammogram? Yes, completed 01/23/2018   Up to date of Bone Density/Dexa? Yes, completed 11/21/2016 Colorectal: completed 02/18/2019  Additional Screenings:  Hepatitis C Screening: 09/26/2018     Plan:    Patient will continue to walk her dog everyday for 30 minutes- 1 hour.   I have personally reviewed and noted the following in the patient's chart:   . Medical and social history . Use of alcohol, tobacco or illicit drugs  . Current medications and supplements . Functional ability and status . Nutritional status . Physical activity . Advanced directives . List of other physicians . Hospitalizations, surgeries, and ER visits in previous 12 months . Vitals . Screenings to include cognitive, depression, and falls . Referrals and appointments  In addition, I have reviewed and discussed with patient certain preventive protocols, quality metrics, and best practice recommendations. A written personalized care plan for preventive services as well as general preventive health recommendations were provided to patient.     Andrez Grime, LPN  [phone]

## 2019-10-25 NOTE — Addendum Note (Signed)
Addended by: Ellamae Sia on: 10/25/2019 08:40 AM   Modules accepted: Orders

## 2019-10-25 NOTE — Progress Notes (Signed)
PCP notes:  Health Maintenance: Needs foot exam   Abnormal Screenings: PHQ9 score- 12   Patient concerns: Patient complains of worsening depression. No suicidal ideations noted.    Nurse concerns: none   Next PCP appt.: 11/08/2019 @ 8:30 am

## 2019-10-25 NOTE — Patient Instructions (Signed)
Kimberly Hartman , Thank you for taking time to come for your Medicare Wellness Visit. I appreciate your ongoing commitment to your health goals. Please review the following plan we discussed and let me know if I can assist you in the future.   Screening recommendations/referrals: Colonoscopy: Up to date, completed 02/18/2019 Mammogram: Up to date, completed 01/23/2018 Bone Density: Up to date, completed 11/21/2016 Recommended yearly ophthalmology/optometry visit for glaucoma screening and checkup Recommended yearly dental visit for hygiene and checkup  Vaccinations: Influenza vaccine: Up to date, completed 07/12/2019 Pneumococcal vaccine: Completed series Tdap vaccine: Up to date, completed 09/12/2012 Shingles vaccine: Completed series    Advanced directives: Please bring a copy of your POA (Power of [place]) and/or Living Will to your next appointment.   Conditions/risks identified: diabetes, hypertension, hyperlipidemia  Next appointment: 11/08/2019 @ 8:30 am    Preventive Care 65 Years and Older, Female Preventive care refers to lifestyle choices and visits with your health care provider that can promote health and wellness. What does preventive care include?  A yearly physical exam. This is also called an annual well check.  Dental exams once or twice a year.  Routine eye exams. Ask your health care provider how often you should have your eyes checked.  Personal lifestyle choices, including:  Daily care of your teeth and gums.  Regular physical activity.  Eating a healthy diet.  Avoiding tobacco and drug use.  Limiting alcohol use.  Practicing safe sex.  Taking low-dose aspirin every day.  Taking vitamin and mineral supplements as recommended by your health care provider. What happens during an annual well check? The services and screenings done by your health care provider during your annual well check will depend on your age, overall health, lifestyle risk factors, and  family history of disease. Counseling  Your health care provider may ask you questions about your:  Alcohol use.  Tobacco use.  Drug use.  Emotional well-being.  Home and relationship well-being.  Sexual activity.  Eating habits.  History of falls.  Memory and ability to understand (cognition).  Work and work Statistician.  Reproductive health. Screening  You may have the following tests or measurements:  Height, weight, and BMI.  Blood pressure.  Lipid and cholesterol levels. These may be checked every 5 years, or more frequently if you are over 59 years old.  Skin check.  Lung cancer screening. You may have this screening every year starting at age 80 if you have a 30-pack-year history of smoking and currently smoke or have quit within the past 15 years.  Fecal occult blood test (FOBT) of the stool. You may have this test every year starting at age 40.  Flexible sigmoidoscopy or colonoscopy. You may have a sigmoidoscopy every 5 years or a colonoscopy every 10 years starting at age 58.  Hepatitis C blood test.  Hepatitis B blood test.  Sexually transmitted disease (STD) testing.  Diabetes screening. This is done by checking your blood sugar (glucose) after you have not eaten for a while (fasting). You may have this done every 1-3 years.  Bone density scan. This is done to screen for osteoporosis. You may have this done starting at age 64.  Mammogram. This may be done every 1-2 years. Talk to your health care provider about how often you should have regular mammograms. Talk with your health care provider about your test results, treatment options, and if necessary, the need for more tests. Vaccines  Your health care provider may recommend certain vaccines,  such as:  Influenza vaccine. This is recommended every year.  Tetanus, diphtheria, and acellular pertussis (Tdap, Td) vaccine. You may need a Td booster every 10 years.  Zoster vaccine. You may need this  after age 64.  Pneumococcal 13-valent conjugate (PCV13) vaccine. One dose is recommended after age 65.  Pneumococcal polysaccharide (PPSV23) vaccine. One dose is recommended after age 6. Talk to your health care provider about which screenings and vaccines you need and how often you need them. This information is not intended to replace advice given to you by your health care provider. Make sure you discuss any questions you have with your health care provider. Document Released: 10/16/2015 Document Revised: 06/08/2016 Document Reviewed: 07/21/2015 Elsevier Interactive Patient Education  2017 [place] Prevention in the Home Falls can cause injuries. They can happen to people of all ages. There are many things you can do to make your home safe and to help prevent falls. What can I do on the outside of my home?  Regularly fix the edges of walkways and driveways and fix any cracks.  Remove anything that might make you trip as you walk through a door, such as a raised step or threshold.  Trim any bushes or trees on the path to your home.  Use bright outdoor lighting.  Clear any walking paths of anything that might make someone trip, such as rocks or tools.  Regularly check to see if handrails are loose or broken. Make sure that both sides of any steps have handrails.  Any raised decks and porches should have guardrails on the edges.  Have any leaves, snow, or ice cleared regularly.  Use sand or salt on walking paths during winter.  Clean up any spills in your garage right away. This includes oil or grease spills. What can I do in the bathroom?  Use night lights.  Install grab bars by the toilet and in the tub and shower. Do not use towel bars as grab bars.  Use non-skid mats or decals in the tub or shower.  If you need to sit down in the shower, use a plastic, non-slip stool.  Keep the floor dry. Clean up any water that spills on the floor as soon as it  happens.  Remove soap buildup in the tub or shower regularly.  Attach bath mats securely with double-sided non-slip rug tape.  Do not have throw rugs and other things on the floor that can make you trip. What can I do in the bedroom?  Use night lights.  Make sure that you have a light by your bed that is easy to reach.  Do not use any sheets or blankets that are too big for your bed. They should not hang down onto the floor.  Have a firm chair that has side arms. You can use this for support while you get dressed.  Do not have throw rugs and other things on the floor that can make you trip. What can I do in the kitchen?  Clean up any spills right away.  Avoid walking on wet floors.  Keep items that you use a lot in easy-to-reach places.  If you need to reach something above you, use a strong step stool that has a grab bar.  Keep electrical cords out of the way.  Do not use floor polish or wax that makes floors slippery. If you must use wax, use non-skid floor wax.  Do not have throw rugs and other things on  the floor that can make you trip. What can I do with my stairs?  Do not leave any items on the stairs.  Make sure that there are handrails on both sides of the stairs and use them. Fix handrails that are broken or loose. Make sure that handrails are as long as the stairways.  Check any carpeting to make sure that it is firmly attached to the stairs. Fix any carpet that is loose or worn.  Avoid having throw rugs at the top or bottom of the stairs. If you do have throw rugs, attach them to the floor with carpet tape.  Make sure that you have a light switch at the top of the stairs and the bottom of the stairs. If you do not have them, ask someone to add them for you. What else can I do to help prevent falls?  Wear shoes that:  Do not have high heels.  Have rubber bottoms.  Are comfortable and fit you well.  Are closed at the toe. Do not wear sandals.  If you  use a stepladder:  Make sure that it is fully opened. Do not climb a closed stepladder.  Make sure that both sides of the stepladder are locked into place.  Ask someone to hold it for you, if possible.  Clearly mark and make sure that you can see:  Any grab bars or handrails.  First and last steps.  Where the edge of each step is.  Use tools that help you move around (mobility aids) if they are needed. These include:  Canes.  Walkers.  Scooters.  Crutches.  Turn on the lights when you go into a dark area. Replace any light bulbs as soon as they burn out.  Set up your furniture so you have a clear path. Avoid moving your furniture around.  If any of your floors are uneven, fix them.  If there are any pets around you, be aware of where they are.  Review your medicines with your doctor. Some medicines can make you feel dizzy. This can increase your chance of falling. Ask your doctor what other things that you can do to help prevent falls. This information is not intended to replace advice given to you by your health care provider. Make sure you discuss any questions you have with your health care provider. Document Released: 07/16/2009 Document Revised: 02/25/2016 Document Reviewed: 10/24/2014 Elsevier Interactive Patient Education  2017 [employer].

## 2019-11-08 ENCOUNTER — Other Ambulatory Visit: Payer: Self-pay

## 2019-11-08 ENCOUNTER — Encounter: Payer: Self-pay | Admitting: Family Medicine

## 2019-11-08 ENCOUNTER — Ambulatory Visit (INDEPENDENT_AMBULATORY_CARE_PROVIDER_SITE_OTHER): Payer: Medicare Other | Admitting: Family Medicine

## 2019-11-08 VITALS — BP 98/58 | HR 86 | Temp 97.3°F | Ht 64.0 in | Wt 172.1 lb

## 2019-11-08 DIAGNOSIS — E2839 Other primary ovarian failure: Secondary | ICD-10-CM

## 2019-11-08 DIAGNOSIS — E119 Type 2 diabetes mellitus without complications: Secondary | ICD-10-CM | POA: Diagnosis not present

## 2019-11-08 DIAGNOSIS — F419 Anxiety disorder, unspecified: Secondary | ICD-10-CM

## 2019-11-08 DIAGNOSIS — Z720 Tobacco use: Secondary | ICD-10-CM | POA: Diagnosis not present

## 2019-11-08 DIAGNOSIS — F321 Major depressive disorder, single episode, moderate: Secondary | ICD-10-CM

## 2019-11-08 MED ORDER — SERTRALINE HCL 50 MG PO TABS: 50.0000 mg | ORAL_TABLET | Freq: Every day | ORAL | 3 refills | Status: DC

## 2019-11-08 NOTE — Patient Instructions (Signed)
Good to see you today  I have sent in sertraline for depression. Take 1/2 tablet nightly for 7 days then increase to 1 tablet.   Follow up in 8 weeks, sooner if worsening symptoms or problems with medication  Please call and schedule an appointment for screening mammogram. A referral is not needed. You will get a bone density study at the same time.  [place]

## 2019-11-08 NOTE — Progress Notes (Signed)
Subjective:    Patient ID: Kimberly Hartman, female    DOB: 12/12/1950, 69 y.o.   MRN: [id number]  HPI This is a 69 yo female who presents today for follow up of chronic medical conditions.   DM type 2- Hgba1c slightly elevated, 7.5, was 7.3 6 months ago. Some dietary indiscretion during pandemic.    Hyperlipidemia- currently on atorvastatin 20 mg. Recent lipid panel showed total cholesterol 141, HDL 49, LDL 80. The 10-year ASCVD risk score Mikey Bussing DC Brooke Bonito., et al., 2013) is: 14.5%   Values used to calculate the score:     Age: 64 years     Sex: Female     Is Non-Hispanic African American: No     Diabetic: Yes     Tobacco smoker: Yes     Systolic Blood Pressure: 98 mmHg     Is BP treated: No     HDL Cholesterol: 49.4 mg/dL     Total Cholesterol: 141 mg/dL   Anxiety/ depression- husband has not done well with pandemic. He has decreased libido which has affected patient. Patient misses socialization. Grateful for rescue dog [name] and working 2 days per week.   Psoriasis- was very bad on soles of her feet. She has been seeing dermatology and having significant improvement with [facility].   Review of Systems  Constitutional: Negative for fatigue and unexpected weight change.  HENT: Negative.   Eyes: Negative.   Respiratory: Negative.   Cardiovascular: Negative.   Gastrointestinal: Negative.   Endocrine: Negative.   Genitourinary: Negative.   Musculoskeletal: Positive for back pain (improved with PT).  Neurological: Negative.   Psychiatric/Behavioral: Positive for dysphoric mood.       Objective:   Physical Exam Vitals reviewed. Exam conducted with a chaperone present.  Constitutional:      General: She is not in acute distress.    Appearance: Normal appearance. She is normal weight. She is not ill-appearing, toxic-appearing or diaphoretic.  HENT:     Head: Normocephalic and atraumatic.     Right Ear: External ear normal.     Left Ear: External ear normal.  Cardiovascular:   Rate and Rhythm: Normal rate and regular rhythm.     Pulses: Normal pulses.     Heart sounds: Normal heart sounds.  Pulmonary:     Effort: Pulmonary effort is normal.     Breath sounds: Normal breath sounds.  Chest:     Breasts:        Right: Normal. No swelling, bleeding, inverted nipple, mass, nipple discharge, skin change or tenderness.        Left: Normal. No swelling, bleeding, inverted nipple, mass, nipple discharge, skin change or tenderness.  Abdominal:     General: Abdomen is flat. Bowel sounds are normal. There is no distension.     Palpations: Abdomen is soft. There is no mass.     Tenderness: There is no abdominal tenderness. There is no guarding or rebound.     Hernia: No hernia is present.  Musculoskeletal:     Cervical back: Normal range of motion and neck supple. No rigidity or tenderness.     Right lower leg: No edema.     Left lower leg: No edema.  Lymphadenopathy:     Cervical: No cervical adenopathy.     Upper Body:     Right upper body: No supraclavicular, axillary or pectoral adenopathy.     Left upper body: No supraclavicular, axillary or pectoral adenopathy.  Skin:    General: Skin is  warm and dry.  Neurological:     Mental Status: She is alert and oriented to person, place, and time.       BP (!) 98/58 (BP Location: Left Arm, Patient Position: Sitting, Cuff Size: Normal)   Pulse 86   Temp (!) 97.3 F (36.3 C) (Temporal)   Ht 5\' 4"  (1.626 m)   Wt 172 lb 1.9 oz (78.1 kg)   SpO2 96%   BMI 29.54 kg/m  Wt Readings from Last 3 Encounters:  11/08/19 172 lb 1.9 oz (78.1 kg)  10/25/19 170 lb (77.1 kg)  05/26/19 170 lb (77.1 kg)   Depression screen [facility] 2/9 11/08/2019 10/25/2019 10/22/2018  Decreased Interest 3 3 0  Down, Depressed, Hopeless 3 3 0  PHQ - 2 Score 6 6 0  Altered sleeping 2 3 0  Tired, decreased energy 3 3 0  Change in appetite 3 0 0  Feeling bad or failure about yourself  0 0 0  Trouble concentrating 0 0 0  Moving slowly or  fidgety/restless 0 0 0  Suicidal thoughts 0 0 0  PHQ-9 Score 14 12 0  Difficult doing work/chores Very difficult Not difficult at all Not difficult at all   Diabetic Foot Exam - Simple   Simple Foot Form Diabetic Foot exam was performed with the following findings: Yes 11/08/2019  8:50 AM  Visual Inspection No deformities, no ulcerations, no other skin breakdown bilaterally: Yes Sensation Testing Intact to touch and monofilament testing bilaterally: Yes Pulse Check Posterior Tibialis and Dorsalis pulse intact bilaterally: Yes Comments Mild scaling of bilateral soles.         Assessment & Plan:  1. Depression, major, single episode, moderate ([place]) -Encouraged patient to continue activities that help with her mood including exercise, work, getting adequate sleep. -Follow-up in 8 weeks, sooner if worsening symptoms or problems with medication - sertraline (ZOLOFT) 50 MG tablet; Take 1 tablet (50 mg total) by mouth daily.  Dispense: 30 tablet; Refill: 3  2. Controlled type 2 diabetes mellitus without complication, without long-term current use of insulin ([place]) -Slightly elevated, patient will work on diet and recheck in 6 months  3. Estrogen deficiency - DG Bone Density; Future  4. Tobacco abuse -Patient does not feel like she is at a place to consider cessation at this time.  5. Anxiety -Doing well on current daily clonazepam at bedtime for sleep. - clonazePAM (KLONOPIN) 0.5 MG tablet; Take 1 tablet (0.5 mg total) by mouth daily as needed. for anxiety  Dispense: 90 tablet; Refill: 1  This visit occurred during the SARS-CoV-2 public health emergency.  Safety protocols were in place, including screening questions prior to the visit, additional usage of staff PPE, and extensive cleaning of exam room while observing appropriate contact time as indicated for disinfecting solutions.    Clarene Reamer, FNP-BC  [facility], [facility]   11/12/2019 7:57 AM

## 2019-11-12 MED ORDER — CLONAZEPAM 0.5 MG PO TABS: 0.5000 mg | ORAL_TABLET | Freq: Every day | ORAL | 1 refills | Status: DC | PRN

## 2019-11-14 ENCOUNTER — Other Ambulatory Visit: Payer: Self-pay | Admitting: Obstetrics & Gynecology

## 2019-11-14 DIAGNOSIS — Z1231 Encounter for screening mammogram for malignant neoplasm of breast: Secondary | ICD-10-CM

## 2020-01-01 ENCOUNTER — Other Ambulatory Visit: Payer: Self-pay

## 2020-01-01 ENCOUNTER — Encounter: Payer: Self-pay | Admitting: Family Medicine

## 2020-01-01 ENCOUNTER — Ambulatory Visit (INDEPENDENT_AMBULATORY_CARE_PROVIDER_SITE_OTHER): Payer: Medicare Other | Admitting: Family Medicine

## 2020-01-01 VITALS — BP 110/58 | HR 74 | Temp 97.8°F | Wt 170.0 lb

## 2020-01-01 DIAGNOSIS — E039 Hypothyroidism, unspecified: Secondary | ICD-10-CM

## 2020-01-01 DIAGNOSIS — L609 Nail disorder, unspecified: Secondary | ICD-10-CM | POA: Diagnosis not present

## 2020-01-01 DIAGNOSIS — E639 Nutritional deficiency, unspecified: Secondary | ICD-10-CM

## 2020-01-01 DIAGNOSIS — F321 Major depressive disorder, single episode, moderate: Secondary | ICD-10-CM | POA: Diagnosis not present

## 2020-01-01 DIAGNOSIS — L659 Nonscarring hair loss, unspecified: Secondary | ICD-10-CM

## 2020-01-01 DIAGNOSIS — F325 Major depressive disorder, single episode, in full remission: Secondary | ICD-10-CM | POA: Insufficient documentation

## 2020-01-01 DIAGNOSIS — R5383 Other fatigue: Secondary | ICD-10-CM

## 2020-01-01 DIAGNOSIS — E119 Type 2 diabetes mellitus without complications: Secondary | ICD-10-CM

## 2020-01-01 DIAGNOSIS — L853 Xerosis cutis: Secondary | ICD-10-CM | POA: Diagnosis not present

## 2020-01-01 LAB — VITAMIN B12: Vitamin B-12: 492 pg/mL (ref 211–911)

## 2020-01-01 LAB — TSH: TSH: 1.15 u[IU]/mL (ref 0.35–4.50)

## 2020-01-01 LAB — T4, FREE: Free T4: 1.52 ng/dL (ref 0.60–1.60)

## 2020-01-01 LAB — FERRITIN: Ferritin: 100.5 ng/mL (ref 10.0–291.0)

## 2020-01-01 LAB — T3: T3, Total: 99 ng/dL (ref 76–181)

## 2020-01-01 NOTE — Progress Notes (Signed)
Subjective:    Patient ID: Kimberly Hartman, female    DOB: 05-08-1951, 69 y.o.   MRN: [id number]  HPI   BP (!) 110/58 (BP Location: Left Arm, Patient Position: Sitting, Cuff Size: Small)   Pulse 74   Temp 97.8 F (36.6 C) (Temporal)   Wt 170 lb (77.1 kg)   SpO2 98%   BMI 29.18 kg/m      Review of Systems  Constitutional: Negative.   HENT: Negative.   Eyes: Negative.   Respiratory: Negative.   Cardiovascular: Negative.   Gastrointestinal:       Decreased appetite  Genitourinary: Negative.   Musculoskeletal: Negative.   Skin:       Dry skin, brittle nails  Neurological: Positive for weakness.  Hematological:       Worsening of allergy to pollen  Psychiatric/Behavioral:       Still feels tired, not enough energy    Review of Systems  Constitutional: Negative.   HENT: Negative.   Eyes: Negative.   Respiratory: Negative.   Cardiovascular: Negative.   Gastrointestinal:       Decreased appetite  Genitourinary: Negative.   Musculoskeletal: Negative.   Skin:       Dry skin, brittle nails  Neurological: Positive for weakness.  Endo/Heme/Allergies:       Worsening of allergy to pollen  Psychiatric/Behavioral:       Still feels tired, not enough energy          Objective:   Physical Exam HENT:     Head: Normocephalic.     Right Ear: External ear normal.     Left Ear: External ear normal.     Nose: Nose normal.     Mouth/Throat:     Mouth: Oropharynx is clear and moist.  Eyes:     Extraocular Movements: EOM normal.     Conjunctiva/sclera: Conjunctivae normal.     Pupils: Pupils are equal, round, and reactive to light.  Cardiovascular:     Rate and Rhythm: Normal rate and regular rhythm.     Heart sounds: Normal heart sounds.  Pulmonary:     Breath sounds: Normal breath sounds.  Abdominal:     General: Bowel sounds are normal.     Palpations: Abdomen is soft.  Musculoskeletal:        General: Normal range of motion.     Cervical back: Normal range of  motion and neck supple.  Skin:    General: Skin is dry.  Neurological:     Mental Status: She is alert and oriented to person, place, and time.     GCS: GCS score is 15.  Psychiatric:        Mood and Affect: Mood and affect normal.        Judgment: Judgment normal.     Physical Exam  Constitutional: She is oriented to person, place, and time and well-developed, well-nourished, and in no distress.  HENT:  Head: Normocephalic.  Right Ear: External ear normal.  Left Ear: External ear normal.  Nose: Nose normal.  Mouth/Throat: Oropharynx is clear and moist.  Eyes: Pupils are equal, round, and reactive to light. Conjunctivae and EOM are normal.  Cardiovascular: Normal rate, regular rhythm and normal heart sounds.  Pulmonary/Chest: Breath sounds normal.  Abdominal: Soft. Bowel sounds are normal.  Musculoskeletal:        General: Normal range of motion.     Cervical back: Normal range of motion and neck supple.  Neurological: She is alert and oriented  to person, place, and time. GCS score is 15.  Skin: Skin is dry.  Psychiatric: Mood, affect and judgment normal.          Assessment & Plan:  1. Thyroid panel; B12; Iron check 2. Referral to an endocrinology 3. Zoloft seems working well, will continue on the same dose 4. Discussed a healthy life style, physical exercises, good sleeping habits, and appropriate hydration. 5. Follow-up in 6 month

## 2020-01-01 NOTE — Patient Instructions (Signed)
Good to see you today  I have put in a referral to endocrine  Please follow up in 4 months

## 2020-01-01 NOTE — Progress Notes (Signed)
Subjective:    Patient ID: Kimberly Hartman, female    DOB: March 10, 1951, 69 y.o.   MRN: [id number]  HPI Chief Complaint  Patient presents with  . Follow-up   This is a 69 yo female who presents today for follow up of depression. Sleeping much better on sertraline.  Less free-floating anxiety.  Has noticed decreased appetite for last couple of months.  She has also noticed thinning of her hair and nails and dry skin.  She has had some fatigue for a while.  She takes her levothyroxine first thing in the morning on empty stomach with water. She is not taking any biotin supplements.  She is walking her dog 3-4 times a day.  No shortness of breath or chest pain.  No swelling of legs.  She requests referral to endocrinology.  She reports that she has seen endocrinology along with her PCP for many years for comanagement of hypothyroidism and diabetes.  Review of Systems Per HPI    Objective:   Physical Exam Vitals reviewed.  Constitutional:      General: She is not in acute distress.    Appearance: Normal appearance. She is normal weight. She is not ill-appearing or toxic-appearing.  HENT:     Head: Normocephalic and atraumatic.  Cardiovascular:     Rate and Rhythm: Normal rate.  Pulmonary:     Effort: Pulmonary effort is normal.  Skin:    General: Skin is warm and dry.     Comments: Slight ridging noticed on nails.  Neurological:     Mental Status: She is alert and oriented to person, place, and time.  Psychiatric:        Mood and Affect: Mood normal.        Behavior: Behavior normal.        Thought Content: Thought content normal.        Judgment: Judgment normal.       BP (!) 110/58 (BP Location: Left Arm, Patient Position: Sitting, Cuff Size: Small)   Pulse 74   Temp 97.8 F (36.6 C) (Temporal)   Wt 170 lb (77.1 kg)   SpO2 98%   BMI 29.18 kg/m  Wt Readings from Last 3 Encounters:  01/01/20 170 lb (77.1 kg)  11/08/19 172 lb 1.9 oz (78.1 kg)  10/25/19 170 lb (77.1 kg)    Depression screen [facility] 2/9 01/01/2020 11/08/2019 10/25/2019 10/22/2018  Decreased Interest 1 3 3  0  Down, Depressed, Hopeless 1 3 3  0  PHQ - 2 Score 2 6 6  0  Altered sleeping 1 2 3  0  Tired, decreased energy 1 3 3  0  Change in appetite 2 3 0 0  Feeling bad or failure about yourself  0 0 0 0  Trouble concentrating 0 0 0 0  Moving slowly or fidgety/restless 0 0 0 0  Suicidal thoughts 0 0 0 0  PHQ-9 Score 6 14 12  0  Difficult doing work/chores - Very difficult Not difficult at all Not difficult at all       Assessment & Plan:  1. Acquired hypothyroidism -We will check expanded thyroid studies given patient's symptoms - TSH - T4, Free - T3 - Ferritin - Vitamin B12 - Ambulatory referral to Endocrinology  2. Dry skin -She has upcoming appointment with dermatology and will discuss her skin symptoms with them at this time - TSH - T4, Free - T3 - Ferritin - Vitamin B12  3. Hair loss -Some improvement with over-the-counter topical treatment - TSH - T4,  Free - T3 - Ferritin - Vitamin B12  4. Controlled type 2 diabetes mellitus without complication, without long-term current use of insulin ([facility]) - Vitamin B12 - Ambulatory referral to Endocrinology  5. Fatigue, unspecified type - Ferritin - Vitamin B12  6. Nail disorder - TSH - T4, Free - T3 - Ferritin - Vitamin B12  7. Nutritional deficiency -She reports decreased intake.  Encouraged her to ensure adequate nutrients - Ferritin - Vitamin B12  8. Depression, major, single episode, moderate ([place]) -Significantly improved on low-dose sertraline.  Will continue current dose per patient request.  -Follow-up in 4 months  This visit occurred during the SARS-CoV-2 public health emergency.  Safety protocols were in place, including screening questions prior to the visit, additional usage of staff PPE, and extensive cleaning of exam room while observing appropriate contact time as indicated for disinfecting solutions.     Clarene Reamer, FNP-BC  [facility] at [facility], [facility]  01/01/2020 6:59 PM

## 2020-01-02 ENCOUNTER — Other Ambulatory Visit (INDEPENDENT_AMBULATORY_CARE_PROVIDER_SITE_OTHER): Payer: Medicare Other

## 2020-01-02 DIAGNOSIS — E2839 Other primary ovarian failure: Secondary | ICD-10-CM

## 2020-01-06 LAB — VITAMIN D 25 HYDROXY (VIT D DEFICIENCY, FRACTURES): VITD: 61.3 ng/mL (ref 30.00–100.00)

## 2020-01-21 ENCOUNTER — Ambulatory Visit
Admission: RE | Admit: 2020-01-21 | Discharge: 2020-01-21 | Disposition: A | Discharge status: Home / Self Care | Payer: Medicare Other | Source: Ambulatory Visit | Attending: Obstetrics & Gynecology | Admitting: Obstetrics & Gynecology

## 2020-01-21 ENCOUNTER — Other Ambulatory Visit: Payer: Self-pay

## 2020-01-21 ENCOUNTER — Other Ambulatory Visit: Payer: Self-pay | Admitting: Family Medicine

## 2020-01-21 ENCOUNTER — Ambulatory Visit
Admission: RE | Admit: 2020-01-21 | Discharge: 2020-01-21 | Disposition: A | Discharge status: Home / Self Care | Payer: Medicare Other | Source: Ambulatory Visit | Attending: Family Medicine | Admitting: Family Medicine

## 2020-01-21 DIAGNOSIS — Z1231 Encounter for screening mammogram for malignant neoplasm of breast: Secondary | ICD-10-CM

## 2020-01-21 DIAGNOSIS — Z780 Asymptomatic menopausal state: Secondary | ICD-10-CM | POA: Diagnosis not present

## 2020-01-21 DIAGNOSIS — E2839 Other primary ovarian failure: Secondary | ICD-10-CM

## 2020-01-21 DIAGNOSIS — M85852 Other specified disorders of bone density and structure, left thigh: Secondary | ICD-10-CM | POA: Diagnosis not present

## 2020-01-31 ENCOUNTER — Telehealth: Payer: Self-pay | Admitting: Family Medicine

## 2020-01-31 NOTE — Telephone Encounter (Signed)
Noted  

## 2020-01-31 NOTE — Telephone Encounter (Signed)
Called to r/s upcoming follow up in July. She wanted to relay a message regarding Bone Density results. She does not want to start any new medication until she follows up with you.

## 2020-02-04 ENCOUNTER — Other Ambulatory Visit: Payer: Self-pay | Admitting: Family Medicine

## 2020-02-04 DIAGNOSIS — E119 Type 2 diabetes mellitus without complications: Secondary | ICD-10-CM

## 2020-02-06 ENCOUNTER — Ambulatory Visit: Payer: Medicare Other | Admitting: Dermatology

## 2020-02-06 ENCOUNTER — Ambulatory Visit (INDEPENDENT_AMBULATORY_CARE_PROVIDER_SITE_OTHER): Payer: Medicare Other | Admitting: Dermatology

## 2020-02-06 ENCOUNTER — Other Ambulatory Visit: Payer: Self-pay

## 2020-02-06 DIAGNOSIS — L409 Psoriasis, unspecified: Secondary | ICD-10-CM

## 2020-02-06 NOTE — Progress Notes (Signed)
   Follow-Up Visit   Subjective  Kimberly Hartman is a 69 y.o. female who presents for the following: Psoriasis (Has been using Betamethasone, Calcipotriene, and Cosentyx, had a major flare after 2nd Covid 19  Vacc. on Feb 27, this year, currently on going but getting better.).  Held the Cosentyx a week due to the vaccine.  Feet had pustules all over the soles. They are drying up some since she took the Cosentyx.  She is using the topical medication.  No side effects noted from Cosentyx.   The following portions of the chart were reviewed this encounter and updated as appropriate:     Revew of Systems:  No other skin or systemic complaints except as noted in HPI or Assessment and Plan.  Objective  Well appearing patient in no apparent distress; mood and affect are within normal limits.  A focused examination was performed including Bilateral feet and bilateral palms. Relevant physical exam findings are noted in the Assessment and Plan.  Objective  Left Middle Plantar Surface, Right Middle Plantar Surface: Erythema with hyperkeratosis, scattered fresh and dried pustules bl plantar feet. B/L palmar hands clear today   Assessment & Plan  Psoriasis (2) Left Middle Plantar Surface; Right Middle Plantar Surface  With recent flare on feet post 2nd Covid vaccine, improving Continue Cosentyx monthly- pt gets sample medication, samples given today and she will self-inject at home. Continue Betamethasone ointment qd/bid, patient has Continue topical Calcipotriene qd/bid, patient has Patient will call for refills 05/2019 labs WNL, but no TB screen Will get yearly TB screen/labs on f/u    Return in about 6 months (around 08/08/2020) for psoriasis, with Dr. Laurence Ferrari. Marene Lenz, CMA, am acting as scribe for Brendolyn Patty, MD .  Documentation: I have reviewed the above documentation for accuracy and completeness, and I agree with the above.  Brendolyn Patty MD

## 2020-02-17 ENCOUNTER — Telehealth: Payer: Self-pay | Admitting: Family Medicine

## 2020-02-17 NOTE — Progress Notes (Signed)
  Chronic Care Management   Note  02/17/2020 Name: Lasonda Umbarger MRN: [id number] DOB: June 13, 1951  Tytianna Griner is a 69 y.o. year old female who is a primary care patient of Elby Beck, FNP. I reached out to [name] by phone today in response to a referral sent by Ms. Adriana Simas PCP, Elby Beck, FNP.   Ms. Renicker was given information about Chronic Care Management services today including:  1. CCM service includes personalized support from designated clinical staff supervised by her physician, including individualized plan of care and coordination with other care providers 2. 24/7 contact phone numbers for assistance for urgent and routine care needs. 3. Service will only be billed when office clinical staff spend 20 minutes or more in a month to coordinate care. 4. Only one practitioner may furnish and bill the service in a calendar month. 5. The patient may stop CCM services at any time (effective at the end of the month) by phone call to the office staff.   Patient agreed to services and verbal consent obtained.   This note is not being shared with the patient for the following reason: To respect privacy (The patient or proxy has requested that the information not be shared).  Follow up plan:   Earney Hamburg Upstream Scheduler

## 2020-02-18 NOTE — Progress Notes (Signed)
Error This note is not being shared with the patient for the following reason: To respect privacy (The patient or proxy has requested that the information not be shared).  Kimberly Hartman Upstream Scheduler  

## 2020-02-27 DIAGNOSIS — L659 Nonscarring hair loss, unspecified: Secondary | ICD-10-CM | POA: Diagnosis not present

## 2020-02-27 DIAGNOSIS — R232 Flushing: Secondary | ICD-10-CM | POA: Diagnosis not present

## 2020-02-27 DIAGNOSIS — R947 Abnormal results of other endocrine function studies: Secondary | ICD-10-CM | POA: Diagnosis not present

## 2020-02-27 DIAGNOSIS — E119 Type 2 diabetes mellitus without complications: Secondary | ICD-10-CM | POA: Diagnosis not present

## 2020-03-04 ENCOUNTER — Other Ambulatory Visit: Payer: Self-pay | Admitting: Family Medicine

## 2020-03-04 ENCOUNTER — Encounter: Payer: Self-pay | Admitting: Family Medicine

## 2020-03-04 DIAGNOSIS — F321 Major depressive disorder, single episode, moderate: Secondary | ICD-10-CM

## 2020-03-04 MED ORDER — SERTRALINE HCL 50 MG PO TABS: 50.0000 mg | ORAL_TABLET | Freq: Every day | ORAL | 3 refills | Status: DC

## 2020-03-04 NOTE — Telephone Encounter (Signed)
Last refilled 11/08/19 #30 x 3 refills.  Last OV 01/01/20  Please advise, thanks.

## 2020-04-06 ENCOUNTER — Other Ambulatory Visit: Payer: Self-pay | Admitting: Family Medicine

## 2020-04-06 DIAGNOSIS — E039 Hypothyroidism, unspecified: Secondary | ICD-10-CM

## 2020-04-09 ENCOUNTER — Telehealth: Payer: Medicare Other

## 2020-04-09 NOTE — Chronic Care Management (AMB) (Deleted)
Chronic Care Management Pharmacy  Name: Kimberly Hartman  MRN: [id number] DOB: 1951-01-19  Chief Complaint/ HPI  Kimberly Hartman,  69 y.o. , female presents for their Initial CCM visit with the clinical pharmacist via telephone.  PCP : Kimberly Beck, FNP  Their chronic conditions include: type 2 diabetes, hypothyroidism, depression, osteoarthritis, psoriasis  Office Visits:  01/01/20: PCP visit - thyroid panel, B12, iron check, referral to endo, continue zoloft, rtc 4 months  Consult Visit:  02/27/20: Endocrinology - initial consult DM and hypothyroidism, increase Actos to 30 mg, continue Januvia, try hair, skin, and nail vitamin, recommend Reclast for osteoporosis   02/07/20: Dermatology - continue Cosentyx monthly injection, samples provided, betamethasone ointment QD/BID PRN, calcipotriene QD/BID  Medications: Outpatient Encounter Medications as of 04/09/2020  Medication Sig  . acetaminophen (TYLENOL) 325 MG tablet Take 650 mg by mouth every 6 (six) hours as needed.  Marland Kitchen atorvastatin (LIPITOR) 20 MG tablet TAKE 1 TABLET BY MOUTH  DAILY  . Calcium Carbonate-Vitamin D (CALCIUM-D) 600-400 MG-UNIT TABS Take 2 tablets by mouth daily.  . cetirizine (ZYRTEC) 10 MG tablet Take 10 mg by mouth daily as needed for allergies.  . Cholecalciferol (VITAMIN D3) 1000 units CAPS Take 1 capsule by mouth daily.  . clonazePAM (KLONOPIN) 0.5 MG tablet Take 1 tablet (0.5 mg total) by mouth daily as needed. for anxiety  . diphenhydrAMINE (BENADRYL) 25 MG tablet Take 25 mg by mouth daily as needed.  . fluticasone (FLONASE) 50 MCG/ACT nasal spray Place into both nostrils daily as needed for allergies or rhinitis.  Marland Kitchen glucose blood (ONE TOUCH ULTRA TEST) test strip Use to check blood sugar once a day.  . Ibuprofen 200 MG CAPS Take 2 tablets by mouth daily as needed.  Marland Kitchen JANUVIA 100 MG tablet TAKE 1 TABLET BY MOUTH  DAILY .  Marland Kitchen levothyroxine (SYNTHROID) 100 MCG tablet TAKE 1 TABLET BY MOUTH IN  THE MORNING  .  Multiple Vitamins-Minerals (MULTIVITAMIN ADULTS 50+) TABS Take 1 tablet by mouth daily.  . pioglitazone (ACTOS) 15 MG tablet TAKE 1 TABLET BY MOUTH  DAILY  . Secukinumab (COSENTYX SENSOREADY PEN [place]) 1 injection ('300mg'$ ) every 4 weeks.  . sertraline (ZOLOFT) 50 MG tablet Take 1 tablet (50 mg total) by mouth daily.   No facility-administered encounter medications on file as of 04/09/2020.   Current Diagnosis/Assessment:  Goals Addressed   None    Hyperlipidemia   LDL goal < ***  Lipid Panel     Component Value Date/Time   CHOL 141 10/25/2019 0828   TRIG 57.0 10/25/2019 0828   HDL 49.40 10/25/2019 0828   LDLCALC 80 10/25/2019 0828    Hepatic Function Latest Ref Rng & Units 05/06/2019 03/19/2018  Total Protein 6.0 - 8.3 g/dL 7.2 7.3  Albumin 3.5 - 5.2 g/dL 4.2 3.9  AST 0 - 37 U/L 17 19  ALT 0 - 35 U/L 16 17  Alk Phosphatase 39 - 117 U/L 73 69  Total Bilirubin 0.2 - 1.2 mg/dL 0.4 0.6     The 10-year ASCVD risk score Kimberly Hartman DC Jr., et al., 2013) is: 20.8%   Values used to calculate the score:     Age: 35 years     Sex: Female     Is Non-Hispanic African American: No     Diabetic: Yes     Tobacco smoker: Yes     Systolic Blood Pressure: 562 mmHg     Is BP treated: No     HDL Cholesterol: 49.4 mg/dL  Total Cholesterol: 141 mg/dL   Patient has failed these meds in past: *** Patient is currently {CHL Controlled/Uncontrolled:[id number]} on the following medications:  . Atorvastatin 20 mg - 1 tablet daily  We discussed:  {CHL HP Upstream Pharmacy discussion:[phone]}  Plan: Continue {CHL HP Upstream Pharmacy Plans:[phone]}  Diabetes   Recent Relevant Labs: Lab Results  Component Value Date/Time   HGBA1C 7.5 (H) 10/25/2019 08:28 AM   HGBA1C 7.3 (H) 05/06/2019 12:44 PM   MICROALBUR 3.9 (H) 10/25/2019 08:40 AM   MICROALBUR 3.2 (H) 10/22/2018 08:10 AM    Checking BG: {CHL HP Blood Glucose Monitoring Frequency:[phone]}  Recent FBG Readings: Recent pre-meal BG  readings: *** Recent 2hr PP BG readings:  *** Recent HS BG readings: ***  Per endo consult 02/27/20 - increased Actos to 30 mg daily, pt concerned about her DM, she does not check BG, diet avoids concentrated carbs, walks her dog a half mile 4x daily for exercise   Patient has failed these meds in past: metformin - diarrhea, glimepiride - hypoglycemia Patient is currently {CHL Controlled/Uncontrolled:[phone]} on the following medications: ***  Januvia 100 mg - 1 tablet daily  Pioglitazone 30 mg - 1 tablet daily  Last diabetic eye exam:  Lab Results  Component Value Date/Time   HMDIABEYEEXA No Retinopathy 04/10/2019 12:00 AM    Last diabetic foot exam: No results found for: HMDIABFOOTEX   We discussed: {CHL HP Upstream Pharmacy discussion:[phone]}  Plan: Continue {CHL HP Upstream Pharmacy Plans:[phone]}  Hypothyroidism   Lab Results  Component Value Date/Time   TSH 1.15 01/01/2020 09:16 AM   TSH 1.30 05/06/2019 12:44 PM   FREET4 1.52 01/01/2020 09:16 AM   Patient has failed these meds in past: *** Patient is currently controlled on the following medications:  . Levothyroxine 100 mcg - 1 tablet daily   We discussed:  {CHL HP Upstream Pharmacy discussion:[phone]}  Plan: Continue {CHL HP Upstream Pharmacy KPQAE:[id number]}   Depression   Patient has failed these meds in past:  Patient is currently {CHL Controlled/Uncontrolled:[id number]} on the following medications:   Sertraline 50 mg - 1 tablet daily (started 11/08/19)  Clonazepam 0.5 mg - 1 tablet daily PRN  We discussed: reports hot flashes/night sweats and hair loss  Plan: Continue {CHL HP Upstream Pharmacy Plans:[phone]}     Patient is currently on the following medications:   Cozentyx [place]   Multivitamin  Ibuprofen 200 mg  Flonase 50 mcg  Benadryl 25 mg  Vitamin D3 1000 IU  Cetirizine '10mg'$    Calcium-Vitamin D 600-400 mg-IU  Tylenol 325 mg   Estroven?   Biotin?  We discussed:     Plan: Continue {CHL HP Upstream Pharmacy MYTRZ:[id number]}  Medication Management:  Pharmacy: [facility]  Adherence:  Social support:  Affordability:  CCM Follow Up:  Debbora Dus, PharmD Clinical Pharmacist [facility] [phone]

## 2020-04-24 ENCOUNTER — Encounter: Payer: Self-pay | Admitting: Family Medicine

## 2020-04-24 DIAGNOSIS — E119 Type 2 diabetes mellitus without complications: Secondary | ICD-10-CM | POA: Diagnosis not present

## 2020-04-24 DIAGNOSIS — H52203 Unspecified astigmatism, bilateral: Secondary | ICD-10-CM | POA: Diagnosis not present

## 2020-04-24 DIAGNOSIS — Z961 Presence of intraocular lens: Secondary | ICD-10-CM | POA: Diagnosis not present

## 2020-04-24 DIAGNOSIS — H26491 Other secondary cataract, right eye: Secondary | ICD-10-CM | POA: Diagnosis not present

## 2020-04-24 LAB — HM DIABETES EYE EXAM

## 2020-04-29 ENCOUNTER — Ambulatory Visit: Payer: Medicare Other | Admitting: Family Medicine

## 2020-05-05 DIAGNOSIS — H26491 Other secondary cataract, right eye: Secondary | ICD-10-CM | POA: Diagnosis not present

## 2020-05-06 ENCOUNTER — Other Ambulatory Visit: Payer: Self-pay

## 2020-05-06 ENCOUNTER — Ambulatory Visit (INDEPENDENT_AMBULATORY_CARE_PROVIDER_SITE_OTHER): Payer: Medicare Other | Admitting: Family Medicine

## 2020-05-06 VITALS — BP 110/80 | HR 61 | Temp 97.7°F | Ht 64.0 in | Wt 167.8 lb

## 2020-05-06 DIAGNOSIS — Z720 Tobacco use: Secondary | ICD-10-CM | POA: Diagnosis not present

## 2020-05-06 DIAGNOSIS — E119 Type 2 diabetes mellitus without complications: Secondary | ICD-10-CM

## 2020-05-06 LAB — LIPID PANEL
Cholesterol: 160 mg/dL (ref 0–200)
HDL: 43.9 mg/dL (ref >39.00)
LDL Cholesterol: 96 mg/dL (ref 0–99)
NonHDL: 116.56
Total CHOL/HDL Ratio: 4
Triglycerides: 104 mg/dL (ref 0.0–149.0)
VLDL: 20.8 mg/dL (ref 0.0–40.0)

## 2020-05-06 LAB — HEMOGLOBIN A1C: Hgb A1c MFr Bld: 6.9 % — ABNORMAL HIGH (ref 4.6–6.5)

## 2020-05-06 NOTE — Progress Notes (Signed)
   Subjective:    Patient ID: Kimberly Hartman, female    DOB: [birth year], 69 y.o.   MRN: [id number]  HPI Chief Complaint  Patient presents with  . Follow-up    4 month   This is a 69 yo female who presents today for follow up of DM, hypothyroidism, hair thinning.   DM type 2- saw endocrinologist, Dr. Honor Junes. Went up on her actos from 15 mg to 30. Has noticed improvement of fasting blood sugars with reduction to low 100s from 150s, plus.   Hypothyroidism-last TSH 1.15 on 01/01/2020.  She has been maintained on levothyroxine 100 mcg daily.  Hair thinning-no abnormalities found in lab work-up.  She was advised advised by her endocrinologist to start a hair and skin vitamin supplement which she thinks has helped.  Tobacco abuse-she smokes 1/2 pack/day.  She is not interested in quitting at this time.  She is interested in lung cancer screening and will be referred.  Review of Systems She denies headaches, visual changes, chest pain, shortness of breath, abdominal pain, leg swelling    Objective:   Physical Exam Physical Exam  Constitutional: Oriented to person, place, and time. She appears well-developed and well-nourished.  HENT:  Head: Normocephalic and atraumatic.  Eyes: Conjunctivae are normal.  Neck: Normal range of motion. Neck supple.  Cardiovascular: Normal rate, regular rhythm and normal heart sounds.   Pulmonary/Chest: Effort normal and breath sounds normal.  Musculoskeletal: No lower extremity edema.  Neurological: Alert and oriented to person, place, and time.  Skin: Skin is warm and dry.  Psychiatric: Normal mood and affect. Behavior is normal. Judgment and thought content normal.  Vitals reviewed.     BP 110/80   Pulse 61   Temp 97.7 F (36.5 C) (Temporal)   Ht 5\' 4"  (1.626 m)   Wt 167 lb 12 oz (76.1 kg)   SpO2 98%   BMI 28.79 kg/m  Wt Readings from Last 3 Encounters:  05/06/20 167 lb 12 oz (76.1 kg)  01/01/20 170 lb (77.1 kg)  11/08/19 172 lb 1.9 oz (78.1  kg)       Assessment & Plan:  1. Controlled type 2 diabetes mellitus without complication, without long-term current use of insulin ([place]) -Plan is to alternate visits between me and endocrinology, encouraged patient's efforts with continued healthy food choices and weight loss - Lipid Panel - Hemoglobin A1c - glucose blood (ONE TOUCH ULTRA TEST) test strip; Use to check blood sugar once a day.  Dispense: 50 each; Refill: 3  2. Tobacco abuse -Encouraged patient to consider at least decreasing amount that she is smoking if she is not interested in quitting at this time.  She will consider -Referral placed for pulmonary  -Follow-up in 6 months  This visit occurred during the SARS-CoV-2 public health emergency.  Safety protocols were in place, including screening questions prior to the visit, additional usage of staff PPE, and extensive cleaning of exam room while observing appropriate contact time as indicated for disinfecting solutions.      Clarene Reamer, FNP-BC  [facility]  05/07/2020 2:49 PM

## 2020-05-06 NOTE — Patient Instructions (Signed)
Good to see you today  Please schedule your follow up in 6 months  Get your flu shot when it becomes available

## 2020-05-07 ENCOUNTER — Encounter: Payer: Self-pay | Admitting: Family Medicine

## 2020-05-07 MED ORDER — GLUCOSE BLOOD VI STRP: ORAL_STRIP | 3 refills | Status: AC

## 2020-05-07 MED ORDER — ATORVASTATIN CALCIUM 40 MG PO TABS: 40.0000 mg | ORAL_TABLET | Freq: Every day | ORAL | 3 refills | Status: DC

## 2020-05-07 NOTE — Addendum Note (Signed)
Addended by: Clarene Reamer B on: 05/07/2020 03:43 PM   Modules accepted: Orders

## 2020-05-20 ENCOUNTER — Other Ambulatory Visit: Payer: Self-pay | Admitting: Family Medicine

## 2020-05-20 DIAGNOSIS — F419 Anxiety disorder, unspecified: Secondary | ICD-10-CM

## 2020-05-20 NOTE — Telephone Encounter (Signed)
Name of Medication: Clonazepam Name of Pharmacy: [facility] Last Fill or Written Date and Quantity: 01/29/20, #90 Last Office Visit and Type: 05/06/20, 4 mo f/u Next Office Visit and Type: 11/11/20, AWV prt 2 Last Controlled Substance Agreement Date: none Last UDS: none

## 2020-05-21 ENCOUNTER — Telehealth: Payer: Medicare Other

## 2020-08-13 ENCOUNTER — Other Ambulatory Visit: Payer: Self-pay

## 2020-08-13 ENCOUNTER — Ambulatory Visit (INDEPENDENT_AMBULATORY_CARE_PROVIDER_SITE_OTHER): Payer: Medicare Other | Admitting: Dermatology

## 2020-08-13 ENCOUNTER — Ambulatory Visit: Payer: Medicare Other | Admitting: Dermatology

## 2020-08-13 DIAGNOSIS — L719 Rosacea, unspecified: Secondary | ICD-10-CM | POA: Diagnosis not present

## 2020-08-13 DIAGNOSIS — L409 Psoriasis, unspecified: Secondary | ICD-10-CM

## 2020-08-13 MED ORDER — BETAMETHASONE DIPROPIONATE AUG 0.05 % EX CREA: TOPICAL_CREAM | CUTANEOUS | 2 refills | Status: DC

## 2020-08-13 NOTE — Patient Instructions (Addendum)
Start betamethasone cream twice daily for 2 weeks then weekends only. Avoid applying to face, groin, and axilla. Use as directed. Risk of skin atrophy with long-term use reviewed.  Continue Cosentyx  Continue calcipotriene twice daily. Consider restarting Xtrac if flares continue.   Topical steroids (such as triamcinolone, fluocinolone, fluocinonide, mometasone, clobetasol, halobetasol, betamethasone, hydrocortisone) can cause thinning and lightening of the skin if they are used for too long in the same area. Your physician has selected the right strength medicine for your problem and area affected on the body. Please use your medication only as directed by your physician to prevent side effects.    Please ask PCP to order Quantiferon Gold in addition to CMP, CBC

## 2020-08-13 NOTE — Progress Notes (Signed)
Follow-Up Visit   Subjective  Kimberly Hartman is a 69 y.o. female who presents for the following: Psoriasis.  Patient here today for 6 month psoriasis follow up. She had a major flare of psoriasis that lasted almost 6 months after having the second dose of the [employer] vaccine. She stopped using betamethasone ointment and is only using calcipotriene topically to bottoms and top of feet. She is on Cosentyx and tolerating well.  Patient also has an area at the top of right foot that started sometime in September. No itching. When hot water hits it she has a hurting pain otherwise it is just an irritant type of pain.   The following portions of the chart were reviewed this encounter and updated as appropriate:  Tobacco  Allergies  Meds  Problems  Med Hx  Surg Hx  Fam Hx      Review of Systems:  No other skin or systemic complaints except as noted in HPI or Assessment and Plan.  Objective  Well appearing patient in no apparent distress; mood and affect are within normal limits.  A focused examination was performed including face, hands, feet, nails. Relevant physical exam findings are noted in the Assessment and Plan.  Objective  Bilateral feet: Thin scaly pink plaques with few pustules  Objective  Head - Anterior (Face): Mild erythema   Assessment & Plan  Psoriasis Bilateral feet  Chronic condition, no cure, only control. Not currently at goal.    Psoriasis - severe on systemic "biologic" treatment injections with long-term medication management.  Psoriasis is a chronic non-curable, but treatable genetic/hereditary disease that may have other systemic features affecting other organ systems such as joints (Psoriatic Arthritis).  It is linked with heart disease, inflammatory bowel disease, non-alcoholic fatty liver disease, and depression. Significant skin psoriasis and/or psoriatic arthritis may have significant symptoms and affects activities of daily activity and often benefits  from systemic "biologic" injection treatments.  These "biologic" treatments have some potential side effects including immunosuppression and require pre-treatment laboratory screening and periodic laboratory monitoring and periodic in person evaluation and monitoring by the attending dermatologist physician.  Start betamethasone cream twice daily for 2 weeks then weekends only. Avoid applying to face, groin, and axilla. Use as directed. Risk of skin atrophy with long-term use reviewed.   Continue Cosentyx. Will request CMP, CBC, quantiferon gold to have done when she has labs with PCP in a month. Continue calcipotriene twice daily. Consider restarting Xtrac if flares continue.   Topical steroids (such as triamcinolone, fluocinolone, fluocinonide, mometasone, clobetasol, halobetasol, betamethasone, hydrocortisone) can cause thinning and lightening of the skin if they are used for too long in the same area. Your physician has selected the right strength medicine for your problem and area affected on the body. Please use your medication only as directed by your physician to prevent side effects.    Ordered Medications: augmented betamethasone dipropionate (DIPROLENE-AF) 0.05 % cream  Rosacea Head - Anterior (Face)  Stable Recommend Afrin mixed with CeraVe PM followed with sunscreen.  Rosacea is a chronic progressive skin condition usually affecting the face of adults. It is treatable but not curable. It sometimes affects the eyes (ocular rosacea) as well. It may respond to topical and/or systemic medication and can flare with stress, sun exposure, alcohol, exercise and some foods.   Return in about 3 months (around 11/13/2020) for Psoriasis.  Graciella Belton, RMA, am acting as scribe for Forest Gleason, MD .  Documentation: I have reviewed the above documentation for  accuracy and completeness, and I agree with the above.  Forest Gleason, MD

## 2020-08-18 ENCOUNTER — Other Ambulatory Visit: Payer: Self-pay | Admitting: Family Medicine

## 2020-08-18 DIAGNOSIS — F419 Anxiety disorder, unspecified: Secondary | ICD-10-CM

## 2020-08-18 DIAGNOSIS — E785 Hyperlipidemia, unspecified: Secondary | ICD-10-CM | POA: Diagnosis not present

## 2020-08-18 DIAGNOSIS — E039 Hypothyroidism, unspecified: Secondary | ICD-10-CM | POA: Diagnosis not present

## 2020-08-18 DIAGNOSIS — E1169 Type 2 diabetes mellitus with other specified complication: Secondary | ICD-10-CM | POA: Diagnosis not present

## 2020-08-20 NOTE — Telephone Encounter (Signed)
Last ov: 11/08/2019 Last refill:05/20/2020 #90 Upcoming appt: 11/11/2020

## 2020-08-22 DIAGNOSIS — Z23 Encounter for immunization: Secondary | ICD-10-CM | POA: Diagnosis not present

## 2020-08-24 ENCOUNTER — Encounter: Payer: Self-pay | Admitting: Dermatology

## 2020-08-24 NOTE — Telephone Encounter (Signed)
Last OV 05/06/2020.

## 2020-09-10 DIAGNOSIS — M545 Low back pain, unspecified: Secondary | ICD-10-CM | POA: Diagnosis not present

## 2020-09-10 DIAGNOSIS — Z6829 Body mass index (BMI) 29.0-29.9, adult: Secondary | ICD-10-CM | POA: Diagnosis not present

## 2020-09-10 DIAGNOSIS — M65322 Trigger finger, left index finger: Secondary | ICD-10-CM | POA: Diagnosis not present

## 2020-09-10 DIAGNOSIS — M150 Primary generalized (osteo)arthritis: Secondary | ICD-10-CM | POA: Diagnosis not present

## 2020-09-10 DIAGNOSIS — E663 Overweight: Secondary | ICD-10-CM | POA: Diagnosis not present

## 2020-09-10 DIAGNOSIS — L920 Granuloma annulare: Secondary | ICD-10-CM | POA: Diagnosis not present

## 2020-09-10 DIAGNOSIS — L409 Psoriasis, unspecified: Secondary | ICD-10-CM | POA: Diagnosis not present

## 2020-09-23 ENCOUNTER — Encounter: Payer: Self-pay | Admitting: Family Medicine

## 2020-11-03 ENCOUNTER — Other Ambulatory Visit: Payer: Medicare Other

## 2020-11-03 ENCOUNTER — Ambulatory Visit: Payer: Medicare Other

## 2020-11-11 ENCOUNTER — Ambulatory Visit: Payer: Medicare Other | Admitting: Family Medicine

## 2020-11-12 ENCOUNTER — Encounter: Payer: Self-pay | Admitting: Family Medicine

## 2020-11-12 ENCOUNTER — Ambulatory Visit (INDEPENDENT_AMBULATORY_CARE_PROVIDER_SITE_OTHER): Payer: Medicare Other | Admitting: Family Medicine

## 2020-11-12 ENCOUNTER — Other Ambulatory Visit: Payer: Self-pay

## 2020-11-12 VITALS — BP 98/52 | HR 62 | Temp 97.6°F | Ht 63.25 in | Wt 166.8 lb

## 2020-11-12 DIAGNOSIS — L659 Nonscarring hair loss, unspecified: Secondary | ICD-10-CM | POA: Insufficient documentation

## 2020-11-12 DIAGNOSIS — J302 Other seasonal allergic rhinitis: Secondary | ICD-10-CM

## 2020-11-12 DIAGNOSIS — L400 Psoriasis vulgaris: Secondary | ICD-10-CM | POA: Diagnosis not present

## 2020-11-12 DIAGNOSIS — E039 Hypothyroidism, unspecified: Secondary | ICD-10-CM | POA: Diagnosis not present

## 2020-11-12 DIAGNOSIS — F419 Anxiety disorder, unspecified: Secondary | ICD-10-CM | POA: Diagnosis not present

## 2020-11-12 DIAGNOSIS — G4700 Insomnia, unspecified: Secondary | ICD-10-CM | POA: Diagnosis not present

## 2020-11-12 DIAGNOSIS — L658 Other specified nonscarring hair loss: Secondary | ICD-10-CM

## 2020-11-12 DIAGNOSIS — F321 Major depressive disorder, single episode, moderate: Secondary | ICD-10-CM | POA: Diagnosis not present

## 2020-11-12 DIAGNOSIS — E118 Type 2 diabetes mellitus with unspecified complications: Secondary | ICD-10-CM

## 2020-11-12 LAB — POCT GLYCOSYLATED HEMOGLOBIN (HGB A1C): Hemoglobin A1C: 6.3 % — AB (ref 4.0–5.6)

## 2020-11-12 LAB — MICROALBUMIN / CREATININE URINE RATIO
Creatinine,U: 96.4 mg/dL
Microalb Creat Ratio: 0.7 mg/g (ref 0.0–30.0)
Microalb, Ur: <0.7 mg/dL (ref 0.0–1.9)

## 2020-11-12 MED ORDER — LEVOTHYROXINE SODIUM 100 MCG PO TABS: 100.0000 ug | ORAL_TABLET | Freq: Every morning | ORAL | 1 refills | Status: DC

## 2020-11-12 MED ORDER — CLONAZEPAM 0.5 MG PO TABS: 0.5000 mg | ORAL_TABLET | Freq: Every day | ORAL | 1 refills | Status: DC | PRN

## 2020-11-12 NOTE — Progress Notes (Signed)
Subjective:     Kimberly Hartman is a 70 y.o. female presenting for Transitions Of Care     HPI  #anxiety/insomnia - takes Clonazepam 0.5 mg at night for sleep - taking once daily - has been on this for 12-13 years - started after her divorce and mother and father health issues - would get anxiety at night  - has tried to stop the medication w/o success  - tried every other day - is also on zoloft with significant improvement - has since gotten remarried and is settled - was having symptoms on top of clonazepam a few times a week before zoloft and now down to once per month - did not respond to [place] - too drowsy   #Diabetes Currently taking actos and [place]  Using medications without difficulties: No Hypoglycemic episodes:No  Hyperglycemic episodes:No  Feet problems:psoriasis  Blood Sugars averaging: 120-140 Last HgbA1c:  Lab Results  Component Value Date   HGBA1C 6.3 (A) 11/12/2020   Follows with Endocrine and Actos was recently increased  Diabetes Health Maintenance Due:    Diabetes Health Maintenance Due  Topic Date Due  . URINE MICROALBUMIN  10/24/2020  . OPHTHALMOLOGY EXAM  04/24/2021  . HEMOGLOBIN A1C  05/12/2021  . FOOT EXAM  11/12/2021    #hair loss - no pubic hair - no eyebrows - bald spots on her head - discussed with endocrine and dermatology - started biotin which seems to have slowed down the loss  #Tobacco use - not interested in quitting - adhesive allergy -   Review of Systems   Social History   Tobacco Use  Smoking Status Current Every Day Smoker  . Packs/day: 0.50  . Years: 50.00  . Pack years: 25.00  . Types: Cigarettes  Smokeless Tobacco Never Used  Tobacco Comment   up to 1.5 ppd for a period of time        Objective:    BP Readings from Last 3 Encounters:  11/12/20 (!) 98/52  05/06/20 110/80  01/01/20 (!) 110/58   Wt Readings from Last 3 Encounters:  11/12/20 166 lb 12 oz (75.6 kg)  05/06/20 167 lb 12 oz  (76.1 kg)  01/01/20 170 lb (77.1 kg)    BP (!) 98/52   Pulse 62   Temp 97.6 F (36.4 C) (Temporal)   Ht 5' 3.25" (1.607 m)   Wt 166 lb 12 oz (75.6 kg)   SpO2 99%   BMI 29.31 kg/m    Physical Exam Constitutional:      General: She is not in acute distress.    Appearance: She is well-developed. She is not diaphoretic.  HENT:     Right Ear: External ear normal.     Left Ear: External ear normal.     Nose: Nose normal.  Eyes:     Conjunctiva/sclera: Conjunctivae normal.  Cardiovascular:     Rate and Rhythm: Normal rate.  Pulmonary:     Effort: Pulmonary effort is normal.  Musculoskeletal:     Cervical back: Neck supple.  Skin:    General: Skin is warm and dry.     Capillary Refill: Capillary refill takes less than 2 seconds.  Neurological:     Mental Status: She is alert. Mental status is at baseline.  Psychiatric:        Mood and Affect: Mood normal.        Behavior: Behavior normal.     Diabetic Foot Exam - Simple   Simple Foot Form Diabetic Foot  exam was performed with the following findings: Yes 11/12/2020 11:56 AM  Visual Inspection No deformities, no ulcerations, no other skin breakdown bilaterally: Yes Sensation Testing Intact to touch and monofilament testing bilaterally: Yes Pulse Check Posterior Tibialis and Dorsalis pulse intact bilaterally: Yes Comments         Assessment & Plan:   Problem List Items Addressed This Visit      Endocrine   Controlled type 2 diabetes mellitus with complication, without long-term current use of insulin ([place])    Complicated by HLD. Well controlled. Normal foot exam. Follows with endocrinology.  Lab Results  Component Value Date   HGBA1C 6.3 (A) 11/12/2020  Cont januvia 100 mg and Actos 30 mg       Relevant Orders   Microalbumin / creatinine urine ratio   POCT glycosylated hemoglobin (Hb A1C) (Completed)   Hypothyroid    Controlled. Cont levothyroxine 100 mcg      Relevant Medications   levothyroxine  (SYNTHROID) 100 MCG tablet     Musculoskeletal and Integument   Psoriasis vulgaris    Dr. Matilde Haymaker with dermatology. Secukinumab which is helping      Female pattern hair loss    Some response to biotin and topical treatment. Cont both. Advised discussing with dermatology next week        Other   Depression, major, single episode, moderate (HCC) - Primary    Stable on zoloft 50 mg. Cont      Insomnia    Dependency on clonazepam 0.5 mg. Encouraged trying 1/2 tablet to see if we can reduce the dose.       Seasonal allergies    Cont cetirizine 10 mg daily as needed       Other Visit Diagnoses    Anxiety       Relevant Medications   clonazePAM (KLONOPIN) 0.5 MG tablet       Return in about 6 months (around 05/12/2021) for medicare wellness.  Lesleigh Noe, MD  This visit occurred during the SARS-CoV-2 public health emergency.  Safety protocols were in place, including screening questions prior to the visit, additional usage of staff PPE, and extensive cleaning of exam room while observing appropriate contact time as indicated for disinfecting solutions.

## 2020-11-12 NOTE — Assessment & Plan Note (Signed)
Stable on zoloft 50 mg. Cont  

## 2020-11-12 NOTE — Assessment & Plan Note (Signed)
Dr. Matilde Haymaker with dermatology. Secukinumab which is helping

## 2020-11-12 NOTE — Assessment & Plan Note (Signed)
Dependency on clonazepam 0.5 mg. Encouraged trying 1/2 tablet to see if we can reduce the dose.

## 2020-11-12 NOTE — Assessment & Plan Note (Signed)
Some response to biotin and topical treatment. Cont both. Advised discussing with dermatology next week

## 2020-11-12 NOTE — Patient Instructions (Signed)
Consider taking 1/2 pill of the clonazepam to see if that is enough to help with sleep

## 2020-11-12 NOTE — Assessment & Plan Note (Signed)
Complicated by HLD. Well controlled. Normal foot exam. Follows with endocrinology.  Lab Results  Component Value Date   HGBA1C 6.3 (A) 11/12/2020  Cont januvia 100 mg and Actos 30 mg

## 2020-11-12 NOTE — Assessment & Plan Note (Signed)
Cont cetirizine 10 mg daily as needed

## 2020-11-12 NOTE — Assessment & Plan Note (Signed)
Controlled. Cont levothyroxine 100 mcg

## 2020-11-17 ENCOUNTER — Other Ambulatory Visit: Payer: Self-pay

## 2020-11-17 ENCOUNTER — Encounter: Payer: Self-pay | Admitting: Dermatology

## 2020-11-17 ENCOUNTER — Ambulatory Visit (INDEPENDENT_AMBULATORY_CARE_PROVIDER_SITE_OTHER): Payer: Medicare Other | Admitting: Dermatology

## 2020-11-17 DIAGNOSIS — L659 Nonscarring hair loss, unspecified: Secondary | ICD-10-CM

## 2020-11-17 DIAGNOSIS — L409 Psoriasis, unspecified: Secondary | ICD-10-CM

## 2020-11-17 DIAGNOSIS — D1801 Hemangioma of skin and subcutaneous tissue: Secondary | ICD-10-CM | POA: Diagnosis not present

## 2020-11-17 MED ORDER — BETAMETHASONE DIPROPIONATE 0.05 % EX OINT: TOPICAL_OINTMENT | CUTANEOUS | 3 refills | Status: DC

## 2020-11-17 NOTE — Patient Instructions (Addendum)
Reviewed risks of biologics including immunosuppression, infections, injection site reaction, and failure to improve condition. Goal is control of skin condition, not cure.  Some older biologics such as Humira and Enbrel may slightly increase risk of malignancy and may worsen congestive heart failure. The use of biologics requires long term medication management, including periodic office visits and monitoring of blood work.  Recommend over the counter urea cream to feet daily.   For temporary redness reduction Mix 1 bottle of Afrin original in 1 bottle of Cerave PM, shake well, and apply 1 hour before you want redness to be improved in the morning

## 2020-11-17 NOTE — Progress Notes (Signed)
   Follow-Up Visit   Subjective  Kimberly Hartman is a 70 y.o. female who presents for the following: Psoriasis (Patient here today for 3 month psoriasis follow up at bilateral feet. She is also using betamethasone to feet on weekends only and calcipotriene on weekdays. Patient advises feet are doing well. No side effects or problems with taking Cosentyx. ).  Patient would also like to discuss thinning hair.   The following portions of the chart were reviewed this encounter and updated as appropriate:   Tobacco  Allergies  Meds  Problems  Med Hx  Surg Hx  Fam Hx      Review of Systems:  No other skin or systemic complaints except as noted in HPI or Assessment and Plan.  Objective  Well appearing patient in no apparent distress; mood and affect are within normal limits.  A focused examination was performed including scalp, feet. Relevant physical exam findings are noted in the Assessment and Plan.  Objective  Bilateral feet: Feet clear today  Objective  Scalp: Diffuse thinning of the crown and widening of the midline part with retention of the frontal hairline  Images     Assessment & Plan  Psoriasis Bilateral feet  Chronic condition with duration over one year. Currently well-controlled.  Recommend over the counter urea cream once daily as long as no irritation to help with thickened skin at feet  Continue Costenyx 300mg  SQ Q4WKS Continue betamethasone on weekends only as needed Continue calcipotriene weekdays as needed  Reviewed risks of biologics including immunosuppression, infections, injection site reaction, and failure to improve condition. Goal is control of skin condition, not cure.  Some older biologics such as Humira and Enbrel may slightly increase risk of malignancy and may worsen congestive heart failure. The use of biologics requires long term medication management, including periodic office visits and monitoring of blood work.  Topical steroids (such as  triamcinolone, fluocinolone, fluocinonide, mometasone, clobetasol, halobetasol, betamethasone, hydrocortisone) can cause thinning and lightening of the skin if they are used for too long in the same area. Your physician has selected the right strength medicine for your problem and area affected on the body. Please use your medication only as directed by your physician to prevent side effects.     Other Related Procedures CMP CBC with Differential/Platelets QuantiFERON-TB Gold Plus  Ordered Medications: betamethasone dipropionate (DIPROLENE) 0.05 % ointment  Other Related Medications augmented betamethasone dipropionate (DIPROLENE-AF) 0.05 % cream  Alopecia Scalp  Chronic condition with expected duration over one year. Condition is bothersome to patient. Currently flared.  Reviewed progressive nature and prognosis.   Recommend Rogaine (minoxidil topical) 5% twice a day. Reviewed risk of facial hair growth.   Negative hair pull test.  Discussed po finasteride prescription; deferred today, may consider at follow-up  Reviewed TSH, Vitmain D and Ferritin - wnl  Hemangiomas - Red papules at scalp - Discussed benign nature - Observe - Call for any changes  Return in about 6 months (around 05/17/2021) for Psoriasis.   Graciella Belton, RMA, am acting as scribe for Forest Gleason, MD .  Documentation: I have reviewed the above documentation for accuracy and completeness, and I agree with the above.  Forest Gleason, MD

## 2020-11-18 ENCOUNTER — Telehealth: Payer: Self-pay | Admitting: *Deleted

## 2020-11-18 DIAGNOSIS — L409 Psoriasis, unspecified: Secondary | ICD-10-CM | POA: Diagnosis not present

## 2020-11-18 DIAGNOSIS — Z87891 Personal history of nicotine dependence: Secondary | ICD-10-CM

## 2020-11-18 DIAGNOSIS — F17210 Nicotine dependence, cigarettes, uncomplicated: Secondary | ICD-10-CM

## 2020-11-19 NOTE — Telephone Encounter (Signed)
Spoke with pt and scheduled SDMV 12/09/20 10:30 CT ordered and will be scheduled Nothing further needed

## 2020-11-20 LAB — CBC WITH DIFFERENTIAL/PLATELET
Basophils Absolute: 0.1 10*3/uL (ref 0.0–0.2)
Basos: 1 %
EOS (ABSOLUTE): 0.1 10*3/uL (ref 0.0–0.4)
Eos: 1 %
Hematocrit: 41 % (ref 34.0–46.6)
Hemoglobin: 13.5 g/dL (ref 11.1–15.9)
Immature Grans (Abs): 0 10*3/uL (ref 0.0–0.1)
Immature Granulocytes: 1 %
Lymphocytes Absolute: 2.3 10*3/uL (ref 0.7–3.1)
Lymphs: 27 %
MCH: 28.4 pg (ref 26.6–33.0)
MCHC: 32.9 g/dL (ref 31.5–35.7)
MCV: 86 fL (ref 79–97)
Monocytes Absolute: 0.8 10*3/uL (ref 0.1–0.9)
Monocytes: 10 %
Neutrophils Absolute: 5.3 10*3/uL (ref 1.4–7.0)
Neutrophils: 60 %
Platelets: 314 10*3/uL (ref 150–450)
RBC: 4.75 x10E6/uL (ref 3.77–5.28)
RDW: 13.1 % (ref 11.7–15.4)
WBC: 8.6 10*3/uL (ref 3.4–10.8)

## 2020-11-20 LAB — COMPREHENSIVE METABOLIC PANEL
ALT: 16 IU/L (ref 0–32)
AST: 20 IU/L (ref 0–40)
Albumin/Globulin Ratio: 1.8 (ref 1.2–2.2)
Albumin: 4.2 g/dL (ref 3.8–4.8)
Alkaline Phosphatase: 78 IU/L (ref 44–121)
BUN/Creatinine Ratio: 20 (ref 12–28)
BUN: 17 mg/dL (ref 8–27)
Bilirubin Total: 0.2 mg/dL (ref 0.0–1.2)
CO2: 23 mmol/L (ref 20–29)
Calcium: 9.2 mg/dL (ref 8.7–10.3)
Chloride: 100 mmol/L (ref 96–106)
Creatinine, Ser: 0.83 mg/dL (ref 0.57–1.00)
GFR calc Af Amer: 83 mL/min/{1.73_m2} (ref >59)
GFR calc non Af Amer: 72 mL/min/{1.73_m2} (ref >59)
Globulin, Total: 2.3 g/dL (ref 1.5–4.5)
Glucose: 134 mg/dL — ABNORMAL HIGH (ref 65–99)
Potassium: 4.9 mmol/L (ref 3.5–5.2)
Sodium: 140 mmol/L (ref 134–144)
Total Protein: 6.5 g/dL (ref 6.0–8.5)

## 2020-11-20 LAB — QUANTIFERON-TB GOLD PLUS
QuantiFERON Mitogen Value: >10 IU/mL
QuantiFERON Nil Value: 0.08 IU/mL
QuantiFERON TB1 Ag Value: 0.1 IU/mL
QuantiFERON TB2 Ag Value: 0.07 IU/mL
QuantiFERON-TB Gold Plus: NEGATIVE

## 2020-11-22 ENCOUNTER — Other Ambulatory Visit: Payer: Self-pay | Admitting: Dermatology

## 2020-11-23 ENCOUNTER — Telehealth: Payer: Self-pay

## 2020-11-23 MED ORDER — JANUVIA 100 MG PO TABS: 100.0000 mg | ORAL_TABLET | Freq: Every day | ORAL | 1 refills | Status: DC

## 2020-11-23 NOTE — Telephone Encounter (Signed)
Pharmacy requests refill on: Januvia 100 mg   LAST REFILL: 05/20/2020 (Q-90, R-1) LAST OV: 11/12/2020 NEXT OV: 05/13/2021 PHARMACY: [facility]  Hgb A1C (05/06/2020): 6.9

## 2020-11-25 ENCOUNTER — Telehealth: Payer: Self-pay

## 2020-11-25 NOTE — Telephone Encounter (Signed)
Patient advised labs ok, continue Cosentyx, JS

## 2020-11-25 NOTE — Telephone Encounter (Signed)
-----   Message from Alfonso Patten, MD sent at 11/24/2020 10:40 AM EST ----- Labs ok. Continue cosentyx.   MAs please call. Thank you!

## 2020-11-26 ENCOUNTER — Ambulatory Visit: Payer: Medicare Other | Admitting: Dermatology

## 2020-12-09 ENCOUNTER — Other Ambulatory Visit: Payer: Self-pay

## 2020-12-09 ENCOUNTER — Ambulatory Visit
Admission: RE | Admit: 2020-12-09 | Discharge: 2020-12-09 | Disposition: A | Discharge status: Home / Self Care | Payer: Medicare Other | Source: Ambulatory Visit | Attending: Acute Care | Admitting: Acute Care

## 2020-12-09 ENCOUNTER — Ambulatory Visit (INDEPENDENT_AMBULATORY_CARE_PROVIDER_SITE_OTHER): Payer: Medicare Other | Admitting: Acute Care

## 2020-12-09 ENCOUNTER — Encounter: Payer: Self-pay | Admitting: Acute Care

## 2020-12-09 VITALS — BP 118/62 | HR 75 | Temp 98.0°F | Ht 63.5 in | Wt 168.8 lb

## 2020-12-09 DIAGNOSIS — Z122 Encounter for screening for malignant neoplasm of respiratory organs: Secondary | ICD-10-CM

## 2020-12-09 DIAGNOSIS — J8410 Pulmonary fibrosis, unspecified: Secondary | ICD-10-CM | POA: Diagnosis not present

## 2020-12-09 DIAGNOSIS — F17210 Nicotine dependence, cigarettes, uncomplicated: Secondary | ICD-10-CM

## 2020-12-09 DIAGNOSIS — I2510 Atherosclerotic heart disease of native coronary artery without angina pectoris: Secondary | ICD-10-CM | POA: Diagnosis not present

## 2020-12-09 DIAGNOSIS — Z87891 Personal history of nicotine dependence: Secondary | ICD-10-CM

## 2020-12-09 DIAGNOSIS — J439 Emphysema, unspecified: Secondary | ICD-10-CM | POA: Diagnosis not present

## 2020-12-09 NOTE — Patient Instructions (Signed)
Thank you for participating in the [facility]. It was our pleasure to meet you today. We will call you with the results of your scan within the next few days. Your scan will be assigned a Lung RADS category score by the physicians reading the scans.  This Lung RADS score determines follow up scanning.  See below for description of categories, and follow up screening recommendations. We will be in touch to schedule your follow up screening annually or based on recommendations of our providers. We will fax a copy of your scan results to your Primary Care Physician, or the physician who referred you to the program, to ensure they have the results. Please call the office if you have any questions or concerns regarding your scanning experience or results.  Our office number is [phone]. Please speak with Denise Phelps, RN. She is our Lung Cancer Screening RN. If she is unavailable when you call, please have the office staff send her a message. She will return your call at her earliest convenience. Remember, if your scan is normal, we will scan you annually as long as you continue to meet the criteria for the program. (Age 55-77, Current smoker or smoker who has quit within the last 15 years). If you are a smoker, remember, quitting is the single most powerful action that you can take to decrease your risk of lung cancer and other pulmonary, breathing related problems. We know quitting is hard, and we are here to help.  Please let us know if there is anything we can do to help you meet your goal of quitting. If you are a former smoker, congratulations. We are proud of you! Remain smoke free! Remember you can refer friends or family members through the number above.  We will screen them to make sure they meet criteria for the program. Thank you for helping us take better care of you by participating in Lung Screening.  Lung RADS Categories:  Lung RADS 1: no nodules  or definitely non-concerning nodules.  Recommendation is for a repeat annual scan in 12 months.  Lung RADS 2:  nodules that are non-concerning in appearance and behavior with a very low likelihood of becoming an active cancer. Recommendation is for a repeat annual scan in 12 months.  Lung RADS 3: nodules that are probably non-concerning , includes nodules with a low likelihood of becoming an active cancer.  Recommendation is for a 6-month repeat screening scan. Often noted after an upper respiratory illness. We will be in touch to make sure you have no questions, and to schedule your 6-month scan.  Lung RADS 4 A: nodules with concerning findings, recommendation is most often for a follow up scan in 3 months or additional testing based on our provider's assessment of the scan. We will be in touch to make sure you have no questions and to schedule the recommended 3 month follow up scan.  Lung RADS 4 B:  indicates findings that are concerning. We will be in touch with you to schedule additional diagnostic testing based on our provider's  assessment of the scan.   

## 2020-12-09 NOTE — Progress Notes (Signed)
Shared Decision Making Visit Lung Cancer Screening Program [phone])   Eligibility:  Age 70 y.o.  Pack Years Smoking History Calculation 37 pack year smoking history (# packs/per year x # years smoked)  Recent History of coughing up blood  no  Unexplained weight loss? no ( >Than 15 pounds within the last 6 months )  Prior History Lung / other cancer no (Diagnosis within the last 5 years already requiring surveillance chest CT Scans).  Smoking Status Current Smoker  Former Smokers: Years since quit: NA   Quit Date: NA  Visit Components:  Discussion included one or more decision making aids. yes  Discussion included risk/benefits of screening. yes  Discussion included potential follow up diagnostic testing for abnormal scans. yes  Discussion included meaning and risk of over diagnosis. yes  Discussion included meaning and risk of False Positives. yes  Discussion included meaning of total radiation exposure. yes  Counseling Included:  Importance of adherence to annual lung cancer LDCT screening. yes  Impact of comorbidities on ability to participate in the program. yes  Ability and willingness to under diagnostic treatment. yes  Smoking Cessation Counseling:  Current Smokers:   Discussed importance of smoking cessation. yes  Information about tobacco cessation classes and interventions provided to patient. yes  Patient provided with "ticket" for LDCT Scan. yes  Symptomatic Patient. no  Counseling NA  Diagnosis Code: Tobacco Use Z72.0  Asymptomatic Patient yes  Counseling (Intermediate counseling: > three minutes counseling) E1740  Former Smokers:   Discussed the importance of maintaining cigarette abstinence. yes  Diagnosis Code: Personal History of Nicotine Dependence. C14.481  Information about tobacco cessation classes and interventions provided to patient. Yes  Patient provided with "ticket" for LDCT Scan. yes  Written Order for Lung Cancer  Screening with LDCT placed in Epic. Yes (CT Chest Lung Cancer Screening Low Dose W/O CM) [id number] Z12.2-Screening of respiratory organs Z87.891-Personal history of nicotine dependence  BP 118/62 (BP Location: Right Arm, Cuff Size: Normal)   Pulse 75   Temp 98 F (36.7 C) (Oral)   Ht 5' 3.5" (1.613 m)   Wt 168 lb 12.8 oz (76.6 kg)   SpO2 96%   BMI 29.43 kg/m    I have spent 25 minutes of face to face time with Kimberly Hartman discussing the risks and benefits of lung cancer screening. We viewed a power point together that explained in detail the above noted topics. We paused at intervals to allow for questions to be asked and answered to ensure understanding.We discussed that the single most powerful action that she can take to decrease her risk of developing lung cancer is to quit smoking. We discussed whether or not she is ready to commit to setting a quit date. We discussed options for tools to aid in quitting smoking including nicotine replacement therapy, non-nicotine medications, support groups, Quit Smart classes, and behavior modification. We discussed that often times setting smaller, more achievable goals, such as eliminating 1 cigarette a day for a week and then 2 cigarettes a day for a week can be helpful in slowly decreasing the number of cigarettes smoked. This allows for a sense of accomplishment as well as providing a clinical benefit. I gave her the " Be Stronger Than Your Excuses" card with contact information for community resources, classes, free nicotine replacement therapy, and access to mobile apps, text messaging, and on-line smoking cessation help. I have also given her my card and contact information in the event she needs to contact me. We  discussed the time and location of the scan, and that either Kimberly Glassman RN or I will call with the results within 24-48 hours of receiving them. I have offered her  a copy of the power point we viewed  as a resource in the event they need  reinforcement of the concepts we discussed today in the office. The patient verbalized understanding of all of  the above and had no further questions upon leaving the office. They have my contact information in the event they have any further questions.  I spent 3 minutes counseling on smoking cessation and the health risks of continued tobacco abuse.  I explained to the patient that there has been a high incidence of coronary artery disease noted on these exams. I explained that this is a non-gated exam therefore degree or severity cannot be determined. This patient is currently on statin therapy. I have asked the patient to follow-up with their PCP regarding any incidental finding of coronary artery disease and management with diet or medication as their PCP  feels is clinically indicated. The patient verbalized understanding of the above and had no further questions upon completion of the visit.      [name], NP 12/09/2020

## 2020-12-10 NOTE — Progress Notes (Signed)
Please call patient and let them  know their  low dose Ct was read as a Lung RADS 2: nodules that are benign in appearance and behavior with a very low likelihood of becoming a clinically active cancer due to size or lack of growth. Recommendation per radiology is for a repeat LDCT in 12 months. .Please let them  know we will order and schedule their  annual screening scan for 12/2021. Please let them  know there was notation of CAD on their  scan.  Please remind the patient  that this is a non-gated exam therefore degree or severity of disease  cannot be determined. Please have them  follow up with their PCP regarding potential risk factor modification, dietary therapy or pharmacologic therapy if clinically indicated. Pt.  is  currently on statin therapy. Please place order for annual  screening scan for  12/2021 and fax results to PCP. Thanks so much. 

## 2020-12-14 ENCOUNTER — Other Ambulatory Visit: Payer: Self-pay | Admitting: *Deleted

## 2020-12-14 DIAGNOSIS — F17210 Nicotine dependence, cigarettes, uncomplicated: Secondary | ICD-10-CM

## 2020-12-14 DIAGNOSIS — Z87891 Personal history of nicotine dependence: Secondary | ICD-10-CM

## 2020-12-15 ENCOUNTER — Encounter: Payer: Self-pay | Admitting: Family Medicine

## 2020-12-15 DIAGNOSIS — I700 Atherosclerosis of aorta: Secondary | ICD-10-CM | POA: Insufficient documentation

## 2021-01-11 ENCOUNTER — Telehealth: Payer: Self-pay

## 2021-01-11 NOTE — Telephone Encounter (Signed)
Pt called requesting a sample of Cosentyx injection, discussed with pt she can come by and pick up 1 Cosentyx sample.

## 2021-01-30 DIAGNOSIS — Z23 Encounter for immunization: Secondary | ICD-10-CM | POA: Diagnosis not present

## 2021-02-12 ENCOUNTER — Encounter: Payer: Self-pay | Admitting: Family Medicine

## 2021-02-12 DIAGNOSIS — F321 Major depressive disorder, single episode, moderate: Secondary | ICD-10-CM

## 2021-02-15 MED ORDER — SERTRALINE HCL 50 MG PO TABS: 50.0000 mg | ORAL_TABLET | Freq: Every day | ORAL | 3 refills | Status: DC

## 2021-02-17 DIAGNOSIS — E039 Hypothyroidism, unspecified: Secondary | ICD-10-CM | POA: Diagnosis not present

## 2021-02-17 DIAGNOSIS — E785 Hyperlipidemia, unspecified: Secondary | ICD-10-CM | POA: Diagnosis not present

## 2021-02-17 DIAGNOSIS — E1169 Type 2 diabetes mellitus with other specified complication: Secondary | ICD-10-CM | POA: Diagnosis not present

## 2021-02-25 ENCOUNTER — Other Ambulatory Visit: Payer: Self-pay | Admitting: Family Medicine

## 2021-02-25 DIAGNOSIS — Z1231 Encounter for screening mammogram for malignant neoplasm of breast: Secondary | ICD-10-CM

## 2021-02-26 ENCOUNTER — Other Ambulatory Visit: Payer: Self-pay

## 2021-02-26 ENCOUNTER — Ambulatory Visit
Admission: RE | Admit: 2021-02-26 | Discharge: 2021-02-26 | Disposition: A | Discharge status: Home / Self Care | Payer: Medicare Other | Source: Ambulatory Visit | Attending: Family Medicine | Admitting: Family Medicine

## 2021-02-26 DIAGNOSIS — Z1231 Encounter for screening mammogram for malignant neoplasm of breast: Secondary | ICD-10-CM | POA: Diagnosis not present

## 2021-03-22 ENCOUNTER — Encounter: Payer: Self-pay | Admitting: Family Medicine

## 2021-03-22 ENCOUNTER — Telehealth (INDEPENDENT_AMBULATORY_CARE_PROVIDER_SITE_OTHER): Payer: Medicare Other | Admitting: Family Medicine

## 2021-03-22 VITALS — Temp 97.2°F | Ht 63.25 in | Wt 169.0 lb

## 2021-03-22 DIAGNOSIS — U071 COVID-19: Secondary | ICD-10-CM | POA: Diagnosis not present

## 2021-03-22 MED ORDER — MOLNUPIRAVIR EUA 200MG CAPSULE: 4.0000 | ORAL_CAPSULE | Freq: Two times a day (BID) | ORAL | 0 refills | Status: AC

## 2021-03-22 NOTE — Progress Notes (Signed)
      Kimberly Hartman T. Kimberly Sison, MD Primary Care and Sports Medicine [place] [facility] [place], [address] Phone: [phone]  [name]  MRN [id number]  Date of Birth: 1950/12/20  Visit Date: 03/22/2021  PCP: Lesleigh Noe, MD  Referred by: Lesleigh Noe, MD  Virtual Visit via Video Note:  I connected with  Kimberly Hartman on 03/22/2021 11:20 AM EDT by a video enabled telemedicine application and verified that I am speaking with the correct person using two identifiers.   Location patient: home computer, tablet, or smartphone Location provider: work or home office Consent: Verbal consent directly obtained from Kimberly Hartman. Persons participating in the virtual visit: patient, provider  I discussed the limitations of evaluation and management by telemedicine and the availability of in person appointments. The patient expressed understanding and agreed to proceed.  Chief Complaint  Patient presents with   Covid Positive    + Home Test last night   Cough   Nasal Congestion   Fatigue    History of Present Illness:  Was at a wedding last weekend, husband Thursday tested positive.  Sat started to get some congestion.  Coughing, sneezing - took a test last night.  + last night.   Trouble falling asleep.  She has completely afebrile. No nausea, vomiting, diarrhea, or other symptoms.  Covid # 4 vaccines.  Smoker - 1/2 ppd.  She does have multiple risk factors detailed below.  Immunization History  Administered Date(s) Administered   Fluad Quad(high Dose 65+) 08/22/2020   Influenza, High Dose Seasonal PF 07/12/2019   Influenza,inj,Quad PF,6+ Mos 09/13/2018   Influenza-Unspecified 09/13/2018   PFIZER(Purple Top)SARS-COV-2 Vaccination 11/09/2019, 11/30/2019, 08/22/2020   PPD Test 06/17/2018   Pneumococcal Conjugate-13 10/19/2015   Pneumococcal Polysaccharide-23 03/27/2013   Tdap 09/12/2012    Zoster Recombinat (Shingrix) 10/18/2017, 06/17/2018     Review of Systems as above: See pertinent positives and pertinent negatives per HPI No acute distress verbally   Observations/Objective/Exam:  An attempt was made to discern vital signs over the phone and per patient if applicable and possible.   General:    Alert, Oriented, appears well and in no acute distress  Pulmonary:     On inspection no signs of respiratory distress.  Psych / Neurological:     Pleasant and cooperative.  Assessment and Plan:    ICD-10-CM   1. COVID-19  U07.1      Multiple risk factors: Age diabetes, immunosuppressed on Cosyntex, smoker.   Placed the patient on antivirals and continues other supportive care. Hold smoking for now.  I discussed the assessment and treatment plan with the patient. The patient was provided an opportunity to ask questions and all were answered. The patient agreed with the plan and demonstrated an understanding of the instructions.   The patient was advised to call back or seek an in-person evaluation if the symptoms worsen or if the condition fails to improve as anticipated.  Follow-up: prn unless noted otherwise below No follow-ups on file.  Meds ordered this encounter  Medications   molnupiravir EUA 200 mg CAPS    Sig: Take 4 capsules (800 mg total) by mouth 2 (two) times daily for 5 days.    Dispense:  40 capsule    Refill:  0   No orders of the defined types were placed in this encounter.   Signed,  Maud Deed. Braxden Lovering, MD

## 2021-04-20 ENCOUNTER — Telehealth: Payer: Self-pay

## 2021-04-20 NOTE — Telephone Encounter (Signed)
Two samples of Cosentyx given to patient yesterday.   LOT: [id number] EXP: 04/2022

## 2021-05-13 ENCOUNTER — Ambulatory Visit (INDEPENDENT_AMBULATORY_CARE_PROVIDER_SITE_OTHER): Payer: Medicare Other | Admitting: Family Medicine

## 2021-05-13 ENCOUNTER — Other Ambulatory Visit: Payer: Self-pay

## 2021-05-13 VITALS — BP 120/56 | HR 69 | Temp 97.5°F | Ht 63.0 in | Wt 171.5 lb

## 2021-05-13 DIAGNOSIS — E039 Hypothyroidism, unspecified: Secondary | ICD-10-CM

## 2021-05-13 DIAGNOSIS — E119 Type 2 diabetes mellitus without complications: Secondary | ICD-10-CM | POA: Diagnosis not present

## 2021-05-13 DIAGNOSIS — F419 Anxiety disorder, unspecified: Secondary | ICD-10-CM

## 2021-05-13 DIAGNOSIS — F5104 Psychophysiologic insomnia: Secondary | ICD-10-CM

## 2021-05-13 DIAGNOSIS — F321 Major depressive disorder, single episode, moderate: Secondary | ICD-10-CM

## 2021-05-13 DIAGNOSIS — E118 Type 2 diabetes mellitus with unspecified complications: Secondary | ICD-10-CM | POA: Diagnosis not present

## 2021-05-13 MED ORDER — LEVOTHYROXINE SODIUM 100 MCG PO TABS: 100.0000 ug | ORAL_TABLET | Freq: Every morning | ORAL | 1 refills | Status: DC

## 2021-05-13 MED ORDER — CLONAZEPAM 0.5 MG PO TABS: 0.5000 mg | ORAL_TABLET | Freq: Every day | ORAL | 1 refills | Status: DC | PRN

## 2021-05-13 MED ORDER — JANUVIA 100 MG PO TABS: 100.0000 mg | ORAL_TABLET | Freq: Every day | ORAL | 1 refills | Status: DC

## 2021-05-13 MED ORDER — ATORVASTATIN CALCIUM 40 MG PO TABS: 40.0000 mg | ORAL_TABLET | Freq: Every day | ORAL | 3 refills | Status: DC

## 2021-05-13 NOTE — Assessment & Plan Note (Signed)
Lab Results  Component Value Date   TSH 1.15 01/01/2020   Controlled. Cont levo 100 mcg

## 2021-05-13 NOTE — Assessment & Plan Note (Signed)
Lab Results  Component Value Date   HGBA1C 6.3 (A) 11/12/2020   Controlled. Cont diabetic diet. Cont januvia 100 mg daily and pioglitazone 30 mg daily

## 2021-05-13 NOTE — Progress Notes (Signed)
Subjective:     Kimberly Hartman is a 70 y.o. female presenting for Follow-up (6 mo/Has eye exam scheduled for end of this month)     HPI  #prior covid - June 2022 - very congested and very tired - couldn't get out of bed - congestion is gone - lethargy is improved - but has noticed some lingering symptoms - not eating right, does not want to go out - low interest in preparing or planning meals - no lost of taste/smell - normal taste  - feels her appetite is off - not getting hungry  Did also have a bad diverticulitis attack 3 weeks ago - with constipation and responded to stool softener - has avoiding nuts since this  For the most part has been feeling this way primarily since Covid - still going to work and caring for the dog but low interest in other activities - thinks recovering lethargy is playing into her symptoms  Sleeping well at night Also taking 1-2 hour nap   Review of Systems   Social History   Tobacco Use  Smoking Status Every Day   Packs/day: 0.75   Years: 50.00   Pack years: 37.50   Types: Cigarettes  Smokeless Tobacco Never  Tobacco Comments   up to 1.5 ppd for a period of time        Objective:    BP Readings from Last 3 Encounters:  05/13/21 (!) 120/56  12/09/20 118/62  11/12/20 (!) 98/52   Wt Readings from Last 3 Encounters:  05/13/21 171 lb 8 oz (77.8 kg)  03/22/21 169 lb (76.7 kg)  12/09/20 168 lb 12.8 oz (76.6 kg)    BP (!) 120/56   Pulse 69   Temp (!) 97.5 F (36.4 C) (Temporal)   Ht '5\' 3"'$  (1.6 m)   Wt 171 lb 8 oz (77.8 kg)   SpO2 98%   BMI 30.38 kg/m    Physical Exam Constitutional:      General: She is not in acute distress.    Appearance: She is well-developed. She is not diaphoretic.  HENT:     Right Ear: External ear normal.     Left Ear: External ear normal.     Nose: Nose normal.  Eyes:     Conjunctiva/sclera: Conjunctivae normal.  Cardiovascular:     Rate and Rhythm: Normal rate and regular rhythm.      Heart sounds: No murmur heard. Pulmonary:     Effort: Pulmonary effort is normal. No respiratory distress.     Breath sounds: Normal breath sounds. No wheezing.  Musculoskeletal:     Cervical back: Neck supple.  Skin:    General: Skin is warm and dry.     Capillary Refill: Capillary refill takes less than 2 seconds.  Neurological:     Mental Status: She is alert. Mental status is at baseline.  Psychiatric:        Mood and Affect: Mood normal.        Behavior: Behavior normal.    Depression screen [facility] 2/9 05/13/2021 11/12/2020 01/01/2020  Decreased Interest '1 1 1  '$ Down, Depressed, Hopeless 1 0 1  PHQ - 2 Score '2 1 2  '$ Altered sleeping '1 2 1  '$ Tired, decreased energy '1 1 1  '$ Change in appetite '1 1 2  '$ Feeling bad or failure about yourself  0 0 0  Trouble concentrating 0 0 0  Moving slowly or fidgety/restless 0 0 0  Suicidal thoughts 0 0 0  PHQ-9  Score '5 5 6  '$ Difficult doing work/chores Not difficult at all Not difficult at all -   GAD 7 : Generalized Anxiety Score 05/13/2021  Nervous, Anxious, on Edge 0  Control/stop worrying 0  Worry too much - different things 0  Trouble relaxing 1  Restless 0  Easily annoyed or irritable 0  Afraid - awful might happen 0  Total GAD 7 Score 1  Anxiety Difficulty Not difficult at all          Assessment & Plan:   Problem List Items Addressed This Visit       Endocrine   Controlled type 2 diabetes mellitus with complication, without long-term current use of insulin ([place]) - Primary    Lab Results  Component Value Date   HGBA1C 6.3 (A) 11/12/2020  Controlled. Cont diabetic diet. Cont januvia 100 mg daily and pioglitazone 30 mg daily      Relevant Medications   atorvastatin (LIPITOR) 40 MG tablet   JANUVIA 100 MG tablet   Hypothyroid    Lab Results  Component Value Date   TSH 1.15 01/01/2020  Controlled. Cont levo 100 mcg      Relevant Medications   levothyroxine (SYNTHROID) 100 MCG tablet     Other   Depression, major,  single episode, moderate (HCC)    Pt notes lack of interest and motivation and excessive sleepiness. Discussed this could be the after-effects of a recent covid-19 infection but reasonable to consider increase in zoloft to see if that helps. She would like to increase zoloft to 100 mg daily. Mychart in a couple of weeks with update. Return in 6 weeks if no improvement - anticipate getting blood work at that time.       Insomnia    Stable on Clonazepam 0.5 mg daily. Return 6 months      Relevant Medications   clonazePAM (KLONOPIN) 0.5 MG tablet   Other Visit Diagnoses     Controlled type 2 diabetes mellitus without complication, without long-term current use of insulin (HCC)       Relevant Medications   atorvastatin (LIPITOR) 40 MG tablet   JANUVIA 100 MG tablet   Anxiety       Relevant Medications   clonazePAM (KLONOPIN) 0.5 MG tablet        Return in about 6 months (around 11/13/2021) for Medicare Wellness .  Lesleigh Noe, MD  This visit occurred during the SARS-CoV-2 public health emergency.  Safety protocols were in place, including screening questions prior to the visit, additional usage of staff PPE, and extensive cleaning of exam room while observing appropriate contact time as indicated for disinfecting solutions.

## 2021-05-13 NOTE — Assessment & Plan Note (Signed)
Stable on Clonazepam 0.5 mg daily. Return 6 months

## 2021-05-13 NOTE — Assessment & Plan Note (Signed)
Pt notes lack of interest and motivation and excessive sleepiness. Discussed this could be the after-effects of a recent covid-19 infection but reasonable to consider increase in zoloft to see if that helps. She would like to increase zoloft to 100 mg daily. Mychart in a couple of weeks with update. Return in 6 weeks if no improvement - anticipate getting blood work at that time.

## 2021-05-13 NOTE — Patient Instructions (Addendum)
#  Lack of interest - Increase Zoloft to 100 mg daily  - message via Mychart in 2-3 weeks with update - Work on making sure you eat at least 1300 calories per day - drink water daily   Return in 6 weeks if no improvement for mood/energy/interest

## 2021-05-20 ENCOUNTER — Ambulatory Visit (INDEPENDENT_AMBULATORY_CARE_PROVIDER_SITE_OTHER): Payer: Medicare Other | Admitting: Dermatology

## 2021-05-20 ENCOUNTER — Other Ambulatory Visit: Payer: Self-pay

## 2021-05-20 DIAGNOSIS — L400 Psoriasis vulgaris: Secondary | ICD-10-CM | POA: Diagnosis not present

## 2021-05-20 DIAGNOSIS — L988 Other specified disorders of the skin and subcutaneous tissue: Secondary | ICD-10-CM | POA: Diagnosis not present

## 2021-05-20 DIAGNOSIS — Z5189 Encounter for other specified aftercare: Secondary | ICD-10-CM | POA: Diagnosis not present

## 2021-05-20 DIAGNOSIS — L304 Erythema intertrigo: Secondary | ICD-10-CM | POA: Diagnosis not present

## 2021-05-20 DIAGNOSIS — L578 Other skin changes due to chronic exposure to nonionizing radiation: Secondary | ICD-10-CM | POA: Diagnosis not present

## 2021-05-20 NOTE — Patient Instructions (Addendum)
If you have any questions or concerns for your doctor, please call our main line at [phone] and press option 4 to reach your doctor's medical assistant. If no one answers, please leave a voicemail as directed and we will return your call as soon as possible. Messages left after 4 pm will be answered the following business day.   You may also send [place] a message via [employer]. We typically respond to MyChart messages within 1-2 business days.  For prescription refills, please ask your pharmacy to contact our office. Our fax number is [phone].  If you have an urgent issue when the clinic is closed that cannot wait until the next business day, you can page your doctor at the number below.    Please note that while we do our best to be available for urgent issues outside of office hours, we are not available 24/7.   If you have an urgent issue and are unable to reach [place], you may choose to seek medical care at your doctor's office, retail clinic, urgent care center, or emergency room.  If you have a medical emergency, please immediately call 911 or go to the emergency department.  Pager Numbers  - Dr. Nehemiah Massed: [phone]  - Dr. Laurence Ferrari: [phone]  - Dr. Nicole Kindred: [phone]  In the event of inclement weather, please call our main line at [phone] for an update on the status of any delays or closures.  Dermatology Medication Tips: Please keep the boxes that topical medications come in in order to help keep track of the instructions about where and how to use these. Pharmacies typically print the medication instructions only on the boxes and not directly on the medication tubes.   If your medication is too expensive, please contact our office at [phone] option 4 or send [place] a message through [place].   We are unable to tell what your co-pay for medications will be in advance as this is different depending on your insurance coverage. However, we may be able to find a substitute  medication at lower cost or fill out paperwork to get insurance to cover a needed medication.   If a prior authorization is required to get your medication covered by your insurance company, please allow [place] 1-2 business days to complete this process.  Drug prices often vary depending on where the prescription is filled and some pharmacies may offer cheaper prices.  The website www.goodrx.com contains coupons for medications through different pharmacies. The prices here do not account for what the cost may be with help from insurance (it may be cheaper with your insurance), but the website can give you the price if you did not use any insurance.  - You can print the associated coupon and take it with your prescription to the pharmacy.  - You may also stop by our office during regular business hours and pick up a GoodRx coupon card.  - If you need your prescription sent electronically to a different pharmacy, notify our office through [facility] or by phone at [phone] option 4.  Recommend Alastin Restorative Skin Complex daily.

## 2021-05-20 NOTE — Progress Notes (Signed)
Follow-Up Visit   Subjective  Kimberly Hartman is a 70 y.o. female who presents for the following: Psoriasis (Patient currently using Cosentyx SQ QM, Calcipotriene and Betamethasone cream QD PRN. Patient states that her psoriasis has been doing well and she is tolerating medications. ) and rash (Inframammary x 3-4 mths - itchy, irritated. Patient would like to discuss treatment options.).  The following portions of the chart were reviewed this encounter and updated as appropriate:   Tobacco  Allergies  Meds  Problems  Med Hx  Surg Hx  Fam Hx     Review of Systems:  No other skin or systemic complaints except as noted in HPI or Assessment and Plan.  Objective  Well appearing patient in no apparent distress; mood and affect are within normal limits.  A focused examination was performed including the chest, feet, and legs. Relevant physical exam findings are noted in the Assessment and Plan.  B/L foot Scale and scattered thin vesicles at feet scaly pink plaque R dorsum foot.   Intermammary Erythematous patches between the breast.   Face Rhytides and volume loss.   Assessment & Plan  Plaque psoriasis B/L foot  Chronic condition with duration or expected duration over one year. Condition is bothersome to patient. Not currently at goal.  Psoriasis is a chronic non-curable, but treatable genetic/hereditary disease that may have other systemic features affecting other organ systems such as joints (Psoriatic Arthritis). It is associated with an increased risk of inflammatory bowel disease, heart disease, non-alcoholic fatty liver disease, and depression.    No concern for joints at this time  Continue Cosentyx SQ QM. Reviewed risks of biologics including immunosuppression, infections, injection site reaction, and failure to improve condition. Goal is control of skin condition, not cure.  Some older biologics such as Humira and Enbrel may slightly increase risk of malignancy and may  worsen congestive heart failure. The use of biologics requires long term medication management, including periodic office visits and monitoring of blood work.  Continue Betamethasone cream to aa's QD PRN weekends only and Calcipotriene 0.05% cream to aa's QD PRN week days.  Samples given of Vtama apply to aa's QD PRN. If please with results patient to call for prescription.   Erythema intertrigo Intermammary  Start HC 2.5% BID prn up to 2 weeks. Topical steroids (such as triamcinolone, fluocinolone, fluocinonide, mometasone, clobetasol, halobetasol, betamethasone, hydrocortisone) can cause thinning and lightening of the skin if they are used for too long in the same area. Your physician has selected the right strength medicine for your problem and area affected on the body. Please use your medication only as directed by your physician to prevent side effects.    hydrocortisone 2.5 % cream - Intermammary Apply topically 2 (two) times daily. Apply to rash between the breast BID prn x up to 2 weeks.  Elastosis of skin Face  Discussed facial fillers Restylane Lyft and Defyne to the nasolabial folds  Recommend Skin Medicinals tretinoin compound QHS and Alastin Restorative Skin Complex cream one pump to face twice a day.  Discussed laser resurfacing for fine lines and rhytides like Halo laser or CO2 laser resurfacing.   Recommend consultation with Dr. Joslyn Devon Cox for all cosmetic options   Will prescribe Skin Medicinals Anti-Aging Tretinoin 0.025%/Niacinamide/Vitamin C/Vitamin E/Turmeric/Resveratrol with Hyaluronic Acid. Apply pea sized amount nightly to the entire face.  The patient was advised this is not covered by insurance since it is made by a compounding pharmacy. They will receive an email to  check out and the medication will be mailed to their home.   Topical retinoid medications like tretinoin can cause dryness and irritation when first started. Only apply a pea-sized amount to the  entire affected area. Avoid applying it around the eyes, edges of mouth and creases at the nose. If you experience irritation, use a good moisturizer first and/or apply the medicine less often. If you are doing well with the medicine, you can increase how often you use it until you are applying every night. Be careful with sun protection while using this medication as it can make you sensitive to the sun. This medicine should not be used by pregnant women.    Return in about 6 months (around 11/20/2021) for psoriasis follow up; filler when available.  Luther Redo, CMA, am acting as scribe for Forest Gleason, MD .  Documentation: I have reviewed the above documentation for accuracy and completeness, and I agree with the above.  Forest Gleason, MD

## 2021-05-26 ENCOUNTER — Encounter: Payer: Self-pay | Admitting: Family Medicine

## 2021-05-26 ENCOUNTER — Other Ambulatory Visit: Payer: Self-pay

## 2021-05-26 DIAGNOSIS — E119 Type 2 diabetes mellitus without complications: Secondary | ICD-10-CM | POA: Diagnosis not present

## 2021-05-26 DIAGNOSIS — H04123 Dry eye syndrome of bilateral lacrimal glands: Secondary | ICD-10-CM | POA: Diagnosis not present

## 2021-05-26 DIAGNOSIS — Z961 Presence of intraocular lens: Secondary | ICD-10-CM | POA: Diagnosis not present

## 2021-05-26 DIAGNOSIS — H52203 Unspecified astigmatism, bilateral: Secondary | ICD-10-CM | POA: Diagnosis not present

## 2021-05-26 DIAGNOSIS — F321 Major depressive disorder, single episode, moderate: Secondary | ICD-10-CM

## 2021-05-26 LAB — HM DIABETES EYE EXAM

## 2021-05-26 MED ORDER — SERTRALINE HCL 50 MG PO TABS: 100.0000 mg | ORAL_TABLET | Freq: Every day | ORAL | 1 refills | Status: DC

## 2021-05-29 ENCOUNTER — Encounter: Payer: Self-pay | Admitting: Dermatology

## 2021-05-29 MED ORDER — HYDROCORTISONE 2.5 % EX CREA: TOPICAL_CREAM | Freq: Two times a day (BID) | CUTANEOUS | 0 refills | Status: AC

## 2021-06-02 ENCOUNTER — Other Ambulatory Visit: Payer: Self-pay

## 2021-06-02 ENCOUNTER — Ambulatory Visit (INDEPENDENT_AMBULATORY_CARE_PROVIDER_SITE_OTHER): Payer: Self-pay | Admitting: Dermatology

## 2021-06-02 ENCOUNTER — Encounter: Payer: Self-pay | Admitting: Dermatology

## 2021-06-02 DIAGNOSIS — L988 Other specified disorders of the skin and subcutaneous tissue: Secondary | ICD-10-CM

## 2021-06-02 NOTE — Patient Instructions (Signed)

## 2021-06-02 NOTE — Progress Notes (Signed)
   Follow-Up Visit   Subjective  Kimberly Hartman is a 70 y.o. female who presents for the following: Facial Elastosis (Patient is here today for facial fillers.).  The following portions of the chart were reviewed this encounter and updated as appropriate:   Tobacco  Allergies  Meds  Problems  Med Hx  Surg Hx  Fam Hx     Review of Systems:  No other skin or systemic complaints except as noted in HPI or Assessment and Plan.  Objective  Well appearing patient in no apparent distress; mood and affect are within normal limits.  A focused examination was performed including the face. Relevant physical exam findings are noted in the Assessment and Plan.  Face Rhytides and volume loss.                        Assessment & Plan  Elastosis of skin Face  1 syringe of Restylane Lyft injected into the B/L nasolabial folds. 1 syringe of Restylane Defyne injected into the B/L nasolabial folds.  Restylane Defyne lot # [id number] exp date 06/02/2022 Restylane Lyft lot # [id number] exp date 07/03/2023  Will plan additional syringe of Lyft to NL folds at follow-up  Filling material injection - Face Prior to the procedure, the patient's past medical history, allergies and the rare but potential risks and complications were reviewed with the patient and a signed consent was obtained. Pre and post-treatment care was discussed and instructions provided.  Location: perioral and nasolabial folds  Filler Type: Restylane defyne and Restylate Lyft  Procedure: The area was prepped thoroughly with hibiclens The area was prepped thoroughly with Puracyn. After introducing the needle into the desired treatment area, the syringe plunger was drawn back to ensure there was no flash of blood prior to injecting the filler in order to minimize risk of intravascular injection and vascular occlusion. After injection of the filler, the treated areas were cleansed and iced to reduce swelling. Post-treatment  instructions were reviewed with the patient.       Patient tolerated the procedure well. The patient will call with any problems, questions or concerns prior to their next appointment.   Return for 1 syringe of Restylane Lyft in 2-3 weeks.  Luther Redo, CMA, am acting as scribe for Forest Gleason, MD .  Documentation: I have reviewed the above documentation for accuracy and completeness, and I agree with the above.  Forest Gleason, MD

## 2021-06-23 ENCOUNTER — Other Ambulatory Visit: Payer: Self-pay

## 2021-06-23 ENCOUNTER — Encounter: Payer: Self-pay | Admitting: Dermatology

## 2021-06-23 ENCOUNTER — Ambulatory Visit (INDEPENDENT_AMBULATORY_CARE_PROVIDER_SITE_OTHER): Payer: Self-pay | Admitting: Dermatology

## 2021-06-23 DIAGNOSIS — L988 Other specified disorders of the skin and subcutaneous tissue: Secondary | ICD-10-CM

## 2021-06-23 NOTE — Progress Notes (Signed)
   Follow-Up Visit   Subjective  Kimberly Hartman is a 70 y.o. female who presents for the following: Facial Elastosis (Patient here today for filler. She advises she primarily wants a lift at the lower face/marionette lines).  The following portions of the chart were reviewed this encounter and updated as appropriate:  Tobacco  Allergies  Meds  Problems  Med Hx  Surg Hx  Fam Hx      Review of Systems: No other skin or systemic complaints except as noted in HPI or Assessment and Plan.   Objective  Well appearing patient in no apparent distress; mood and affect are within normal limits.  A focused examination was performed including face. Relevant physical exam findings are noted in the Assessment and Plan.  Head - Anterior (Face) Rhytides and volume loss.              Assessment & Plan  Elastosis of skin Head - Anterior (Face)  Patient is interested in more filler at mid face and a lift at the lower face  Discussed we can get a lift in the area she wants with mid-face filler but it would take a few syringes. For the cost, I think she would get better results for what she is looking for (a tightening and lift at lower face and jaw) with sofwave or ultherapy or perhaps threads. Recommend consultation with Dr. Joslyn Devon Cox of [facility]  Also recommend alastin restorative skin complex   Return if symptoms worsen or fail to improve. I, Ruthell Rummage, CMA, am acting as scribe for Forest Gleason, MD.  Documentation: I have reviewed the above documentation for accuracy and completeness, and I agree with the above.  Forest Gleason, MD

## 2021-06-23 NOTE — Patient Instructions (Addendum)
Recommend Restorative Skin Complex with [employer] can purchase  At  [email or web address]     If you have any questions or concerns for your doctor, please call our main line at [phone] and press option 4 to reach your doctor's medical assistant. If no one answers, please leave a voicemail as directed and we will return your call as soon as possible. Messages left after 4 pm will be answered the following business day.   You may also send [place] a message via [place]. We typically respond to MyChart messages within 1-2 business days.  For prescription refills, please ask your pharmacy to contact our office. Our fax number is [phone].  If you have an urgent issue when the clinic is closed that cannot wait until the next business day, you can page your doctor at the number below.    Please note that while we do our best to be available for urgent issues outside of office hours, we are not available 24/7.   If you have an urgent issue and are unable to reach [place], you may choose to seek medical care at your doctor's office, retail clinic, urgent care center, or emergency room.  If you have a medical emergency, please immediately call 911 or go to the emergency department.  Pager Numbers  - Dr. Nehemiah Massed: [phone]  - Dr. Laurence Ferrari: [phone]  - Dr. Nicole Kindred: [phone]  In the event of inclement weather, please call our main line at [phone] for an update on the status of any delays or closures.  Dermatology Medication Tips: Please keep the boxes that topical medications come in in order to help keep track of the instructions about where and how to use these. Pharmacies typically print the medication instructions only on the boxes and not directly on the medication tubes.   If your medication is too expensive, please contact our office at [phone] option 4 or send [place] a message through [place].   We are unable to tell what your  co-pay for medications will be in advance as this is different depending on your insurance coverage. However, we may be able to find a substitute medication at lower cost or fill out paperwork to get insurance to cover a needed medication.   If a prior authorization is required to get your medication covered by your insurance company, please allow [place] 1-2 business days to complete this process.  Drug prices often vary depending on where the prescription is filled and some pharmacies may offer cheaper prices.  The website www.goodrx.com contains coupons for medications through different pharmacies. The prices here do not account for what the cost may be with help from insurance (it may be cheaper with your insurance), but the website can give you the price if you did not use any insurance.  - You can print the associated coupon and take it with your prescription to the pharmacy.  - You may also stop by our office during regular business hours and pick up a GoodRx coupon card.  - If you need your prescription sent electronically to a different pharmacy, notify our office through [facility] or by phone at [phone] option 4.

## 2021-07-22 DIAGNOSIS — Z23 Encounter for immunization: Secondary | ICD-10-CM | POA: Diagnosis not present

## 2021-08-13 ENCOUNTER — Encounter: Payer: Self-pay | Admitting: Family Medicine

## 2021-09-01 ENCOUNTER — Telehealth: Payer: Self-pay

## 2021-09-01 DIAGNOSIS — R5383 Other fatigue: Secondary | ICD-10-CM | POA: Diagnosis not present

## 2021-09-01 DIAGNOSIS — E1169 Type 2 diabetes mellitus with other specified complication: Secondary | ICD-10-CM | POA: Diagnosis not present

## 2021-09-01 DIAGNOSIS — E785 Hyperlipidemia, unspecified: Secondary | ICD-10-CM | POA: Diagnosis not present

## 2021-09-01 DIAGNOSIS — E039 Hypothyroidism, unspecified: Secondary | ICD-10-CM | POA: Diagnosis not present

## 2021-09-01 DIAGNOSIS — R6889 Other general symptoms and signs: Secondary | ICD-10-CM | POA: Diagnosis not present

## 2021-09-01 DIAGNOSIS — E119 Type 2 diabetes mellitus without complications: Secondary | ICD-10-CM | POA: Diagnosis not present

## 2021-09-01 LAB — HEMOGLOBIN A1C: Hemoglobin A1C: 7

## 2021-09-01 NOTE — Telephone Encounter (Signed)
Patient came into today to pick up Cosentyx samples.  Two boxes given to patient. LOT: [id number] EXP:11/2022

## 2021-09-08 DIAGNOSIS — L409 Psoriasis, unspecified: Secondary | ICD-10-CM | POA: Diagnosis not present

## 2021-09-08 DIAGNOSIS — Z6830 Body mass index (BMI) 30.0-30.9, adult: Secondary | ICD-10-CM | POA: Diagnosis not present

## 2021-09-08 DIAGNOSIS — M150 Primary generalized (osteo)arthritis: Secondary | ICD-10-CM | POA: Diagnosis not present

## 2021-09-08 DIAGNOSIS — E669 Obesity, unspecified: Secondary | ICD-10-CM | POA: Diagnosis not present

## 2021-09-08 DIAGNOSIS — M65322 Trigger finger, left index finger: Secondary | ICD-10-CM | POA: Diagnosis not present

## 2021-09-11 DIAGNOSIS — Z23 Encounter for immunization: Secondary | ICD-10-CM | POA: Diagnosis not present

## 2021-10-24 ENCOUNTER — Other Ambulatory Visit: Payer: Self-pay | Admitting: Family Medicine

## 2021-10-24 DIAGNOSIS — F321 Major depressive disorder, single episode, moderate: Secondary | ICD-10-CM

## 2021-11-03 NOTE — Progress Notes (Signed)
Subjective:   Kimberly Hartman is a 71 y.o. female who presents for Medicare Annual (Subsequent) preventive examination.  I connected with Kimberly Hartman today by telephone and verified that I am speaking with the correct person using two identifiers. Location patient: home Location provider: work Persons participating in the virtual visit: patient, Marine scientist.    I discussed the limitations, risks, security and privacy concerns of performing an evaluation and management service by telephone and the availability of in person appointments. I also discussed with the patient that there may be a patient responsible charge related to this service. The patient expressed understanding and verbally consented to this telephonic visit.    Interactive audio and video telecommunications were attempted between this provider and patient, however failed, due to patient having technical difficulties OR patient did not have access to video capability.  We continued and completed visit with audio only.  Some vital signs may be absent or patient reported.   Time Spent with patient on telephone encounter: 25 minutes  Review of Systems     Cardiac Risk Factors include: advanced age (>77men, >15 women);diabetes mellitus     Objective:    Today's Vitals   11/04/21 0853  Weight: 171 lb (77.6 kg)  Height: 5\' 3"  (1.6 m)   Body mass index is 30.29 kg/m.  Advanced Directives 11/04/2021 10/25/2019 05/26/2019 03/14/2019 11/22/2018 10/22/2018 03/19/2018  Does Patient Have a Medical Advance Directive? Yes Yes Yes Yes Yes Yes No  Type of Paramedic of [place];Living will [place];Living will [place];Living will [place];Living will [place];Living will [place];Living will -  Does patient want to make changes to medical advance directive? Yes (MAU/Ambulatory/Procedural Areas - Information given) - - No -  Patient declined No - Patient declined - -  Copy of [place] in Chart? Yes - validated most recent copy scanned in chart (See row information) No - copy requested No - copy requested No - copy requested No - copy requested No - copy requested -  Would patient like information on creating a medical advance directive? - - - - - - -    Current Medications (verified) Outpatient Encounter Medications as of 11/04/2021  Medication Sig   acetaminophen (TYLENOL) 325 MG tablet Take 650 mg by mouth every 6 (six) hours as needed.   atorvastatin (LIPITOR) 40 MG tablet Take 1 tablet (40 mg total) by mouth daily.   Calcium Carbonate-Vitamin D (CALCIUM-D) 600-400 MG-UNIT TABS Take 2 tablets by mouth daily.   cetirizine (ZYRTEC) 10 MG tablet Take 10 mg by mouth daily as needed for allergies.   Cholecalciferol (VITAMIN D3) 1000 units CAPS Take 1 capsule by mouth daily.   diphenhydrAMINE (BENADRYL) 25 MG tablet Take 25 mg by mouth daily as needed.   fluticasone (FLONASE) 50 MCG/ACT nasal spray Place into both nostrils daily as needed for allergies or rhinitis.   glucose blood (ONE TOUCH ULTRA TEST) test strip Use to check blood sugar once a day.   hydrocortisone 2.5 % cream Apply topically 2 (two) times daily. Apply to rash between the breast BID prn x up to 2 weeks.   Ibuprofen 200 MG CAPS Take 2 tablets by mouth daily as needed.   JANUVIA 100 MG tablet Take 1 tablet (100 mg total) by mouth daily.   levothyroxine (SYNTHROID) 100 MCG tablet Take 1 tablet (100 mcg total) by mouth every morning.   Multiple Vitamins-Minerals (MULTIVITAMIN  ADULTS 50+) TABS Take 1 tablet by mouth daily.   pioglitazone (ACTOS) 30 MG tablet Take 30 mg by mouth daily.   Secukinumab (COSENTYX SENSOREADY PEN  [place]) 1 injection (300mg ) every 4 weeks.   sertraline (ZOLOFT) 50 MG tablet TAKE 2 TABLETS BY MOUTH  DAILY   calcipotriene (DOVONOX) 0.005 % cream APPLY TO AFFECTED AREA(S)  TOPICALLY DAILY (Patient not taking:  Reported on 11/04/2021)   clonazePAM (KLONOPIN) 0.5 MG tablet Take 1 tablet (0.5 mg total) by mouth daily as needed. for anxiety (Patient not taking: Reported on 11/04/2021)   No facility-administered encounter medications on file as of 11/04/2021.    Allergies (verified) Chantix [varenicline tartrate], Metformin and related, Neosporin plus max st, Penicillins, Tape, Tetracyclines & related, and Wellbutrin [bupropion]   History: Past Medical History:  Diagnosis Date   Anxiety    Arthritis    Cataract    Diabetes mellitus without complication ([place])    Psoriasis    Thyroid disease    Past Surgical History:  Procedure Laterality Date   ABDOMINAL HYSTERECTOMY  2001   [place]   BREAST EXCISIONAL BIOPSY Right    benign   DENTAL SURGERY     EYE SURGERY  2018   Cataract   KNEE ARTHROSCOPY WITH MENISCAL REPAIR Right    Family History  Problem Relation Age of Onset   Lung cancer Mother    Esophageal cancer Father    Stroke Maternal Grandmother    Heart disease Maternal Grandmother    Colon cancer Neg Hx    Rectal cancer Neg Hx    Stomach cancer Neg Hx    Social History   Socioeconomic History   Marital status: Married    Spouse name: [name]   Number of children: 0   Years of education: bachelors   Highest education level: Not on file  Occupational History   Not on file  Tobacco Use   Smoking status: Every Day    Packs/day: 0.75    Years: 50.00    Pack years: 37.50    Types: Cigarettes   Smokeless tobacco: Never   Tobacco comments:    up to 1.5 ppd for a period of time  Vaping Use   Vaping Use: Never used  Substance and Sexual Activity   Alcohol use: Yes    Comment: wine a few times a week   Drug use: Never   Sexual activity: Not Currently    Birth control/protection: Surgical  Other Topics Concern   Not on file  Social History Narrative   11/12/20   From: [place] area originally, moved to [facility] 2013 to be  near brother   Living: with husband, Gershon Mussel (2018) and Designer, television/film set   Work: part-time for [employer]      Family: 2 step children - ok relationship and step grandchildren, brother in [place]      Enjoys: golf, walking      Exercise: walking the dog   Diet: diabetic diet - tries to avoid carbs and sugar      Safety   Seat belts: Yes    Guns: No   Safe in relationships: Yes    Social Determinants of Radio broadcast assistant Strain: Low Risk    Difficulty of Paying Living Expenses: Not hard at all  Food Insecurity: No Food Insecurity   Worried About Charity fundraiser in the Last Year: Never true   Ran  Out of Food in the Last Year: Never true  Transportation Needs: No Transportation Needs   Lack of Transportation (Medical): No   Lack of Transportation (Non-Medical): No  Physical Activity: Insufficiently Active   Days of Exercise per Week: 7 days   Minutes of Exercise per Session: 20 min  Stress: No Stress Concern Present   Feeling of Stress : Not at all  Social Connections: Moderately Isolated   Frequency of Communication with Friends and Family: More than three times a week   Frequency of Social Gatherings with Friends and Family: More than three times a week   Attends Religious Services: Never   Marine scientist or Organizations: No   Attends Music therapist: Never   Marital Status: Married    Tobacco Counseling Ready to quit: Not Answered Counseling given: Not Answered Tobacco comments: up to 1.5 ppd for a period of time   Clinical Intake:  Pre-visit preparation completed: Yes  Pain : No/denies pain     BMI - recorded: 30.29 Nutritional Status: BMI > 30  Obese Nutritional Risks: None Diabetes: Yes CBG done?: No Did pt. bring in CBG monitor from home?: No  How often do you need to have someone help you when you read instructions, pamphlets, or other written materials from your doctor or pharmacy?: 1 - Never  Diabetes:  Is the  patient diabetic?  Yes  If diabetic, was a CBG obtained today?  No  Did the patient bring in their glucometer from home?  No  How often do you monitor your CBG's? 1 time per week.   Financial Strains and Diabetes Management:  Are you having any financial strains with the device, your supplies or your medication? No .  Does the patient want to be seen by Chronic Care Management for management of their diabetes?  No  Would the patient like to be referred to a Nutritionist or for Diabetic Management?  No   Diabetic Exams:  Diabetic Eye Exam: Completed 05/26/21.   Diabetic Foot Exam: Completed 11/12/20   Interpreter Needed?: No  Information entered by :: Orrin Brigham LPN   Activities of Daily Living In your present state of health, do you have any difficulty performing the following activities: 11/04/2021  Hearing? N  Vision? N  Difficulty concentrating or making decisions? N  Walking or climbing stairs? N  Dressing or bathing? N  Doing errands, shopping? N  Preparing Food and eating ? N  Using the Toilet? N  In the past six months, have you accidently leaked urine? N  Do you have problems with loss of bowel control? N  Managing your Medications? N  Managing your Finances? N  Housekeeping or managing your Housekeeping? N  Some recent data might be hidden    Patient Care Team: Lesleigh Noe, MD as PCP - General (Family Medicine) Debbora Dus, [facility] as Pharmacist (Pharmacist)  Indicate any recent Medical Services you may have received from other than [facility] providers in the past year (date may be approximate).     Assessment:   This is a routine wellness examination for [place].  Hearing/Vision screen Hearing Screening - Comments:: No issues  Vision Screening - Comments:: Last exam 05/26/21, Dr. Prudencio Burly, wears glasses   Dietary issues and exercise activities discussed: Current Exercise Habits: Home exercise routine;The patient has a physically strenuous job, but has no  regular exercise apart from work., Type of exercise: walking, Time (Minutes): 20, Frequency (Times/Week): 7, Weekly Exercise (Minutes/Week): 140, Intensity: Moderate  Goals Addressed             This Visit's Progress    Patient Stated       Would like to lose 5lbs        Depression Screen PHQ 2/9 Scores 11/04/2021 05/13/2021 11/12/2020 01/01/2020 11/08/2019 10/25/2019 10/22/2018  PHQ - 2 Score 0 2 1 2 6 6  0  PHQ- 9 Score - 5 5 6 14 12  0    Fall Risk Fall Risk  11/04/2021 11/12/2020 10/25/2019 10/22/2018 04/06/2018  Falls in the past year? 0 0 0 0 No  Number falls in past yr: 0 0 0 - -  Injury with Fall? 0 - 0 - -  Risk for fall due to : No Fall Risks - Medication side effect - -  Follow up Falls prevention discussed - Falls evaluation completed;Falls prevention discussed - -    FALL RISK PREVENTION PERTAINING TO THE HOME:  Any stairs in or around the home? Yes  If so, are there any without handrails? No  Home free of loose throw rugs in walkways, pet beds, electrical cords, etc? Yes  Adequate lighting in your home to reduce risk of falls? Yes   ASSISTIVE DEVICES UTILIZED TO PREVENT FALLS:  Life alert? No  Use of a cane, walker or w/c? No  Grab bars in the bathroom? No  Shower chair or bench in shower? No  Elevated toilet seat or a handicapped toilet? Yes   TIMED UP AND GO:  Was the test performed? No .    Cognitive Function: Normal cognitive status assessed by  this Nurse Health Advisor. No abnormalities found.   MMSE - Mini Mental State Exam 10/25/2019 10/22/2018  Orientation to time 5 5  Orientation to Place 5 5  Registration 3 3  Attention/ Calculation 5 0  Recall 3 3  Language- name 2 objects - 0  Language- repeat 1 1  Language- follow 3 step command - 3  Language- read & follow direction - 0  Write a sentence - 0  Copy design - 0  Total score - 20        Immunizations Immunization History  Administered Date(s) Administered   Fluad Quad(high Dose 65+)  08/22/2020   Influenza, High Dose Seasonal PF 07/12/2019   Influenza,inj,Quad PF,6+ Mos 09/13/2018   Influenza-Unspecified 09/13/2018, 07/07/2021   PFIZER(Purple Top)SARS-COV-2 Vaccination 11/09/2019, 11/30/2019, 08/22/2020, 01/30/2021   PPD Test 06/17/2018   Pfizer Covid-19 Vaccine Bivalent Booster 53yrs & up 09/11/2021   Pneumococcal Conjugate-13 10/19/2015   Pneumococcal Polysaccharide-23 03/27/2013   Tdap 09/12/2012   Zoster Recombinat (Shingrix) 10/18/2017, 06/17/2018    TDAP status: Due, Education has been provided regarding the importance of this vaccine. Advised may receive this vaccine at local pharmacy or Health Dept. Aware to provide a copy of the vaccination record if obtained from local pharmacy or Health Dept. Verbalized acceptance and understanding.  Flu Vaccine status: Up to date  Pneumococcal vaccine status: Up to date  Covid-19 vaccine status: Completed vaccines  Qualifies for Shingles Vaccine? Yes   Zostavax completed No   Shingrix Completed?: Yes  Screening Tests Health Maintenance  Topic Date Due   Pneumonia Vaccine 63+ Years old (3 - PPSV23 if available, else PCV20) 03/27/2018   HEMOGLOBIN A1C  05/12/2021   URINE MICROALBUMIN  11/12/2021   FOOT EXAM  11/12/2021   COLONOSCOPY (Pts 45-69yrs Insurance coverage will need to be confirmed)  02/17/2022   OPHTHALMOLOGY EXAM  05/26/2022   TETANUS/TDAP  09/12/2022   MAMMOGRAM  02/27/2023   INFLUENZA VACCINE  Completed   DEXA SCAN  Completed   COVID-19 Vaccine  Completed   Hepatitis C Screening  Completed   Zoster Vaccines- Shingrix  Completed   HPV VACCINES  Aged Out    Health Maintenance  Health Maintenance Due  Topic Date Due   Pneumonia Vaccine 28+ Years old (39 - PPSV23 if available, else PCV20) 03/27/2018   HEMOGLOBIN A1C  05/12/2021   URINE MICROALBUMIN  11/12/2021    Colorectal cancer screening: Type of screening: Colonoscopy. Completed 02/18/19. Repeat every 3 years  Mammogram status: Completed  02/26/21. Repeat every year  Bone Density status: Ordered 11/04/21. Pt provided with contact info and advised to call to schedule appt.  Lung Cancer Screening: (Low Dose CT Chest recommended if Age 59-80 years, 30 pack-year currently smoking OR have quit w/in 15years.) does qualify. Lung CT completed 12/09/20    Additional Screening:  Hepatitis C Screening: does qualify; Completed 09/26/18  Vision Screening: Recommended annual ophthalmology exams for early detection of glaucoma and other disorders of the eye. Is the patient up to date with their annual eye exam?  Yes  Who is the provider or what is the name of the office in which the patient attends annual eye exams? Dr. Prudencio Burly   Dental Screening: Recommended annual dental exams for proper oral hygiene  Community Resource Referral / Chronic Care Management: CRR required this visit?  No   CCM required this visit?  No      Plan:     I have personally reviewed and noted the following in the patients chart:   Medical and social history Use of alcohol, tobacco or illicit drugs  Current medications and supplements including opioid prescriptions.  Functional ability and status Nutritional status Physical activity Advanced directives List of other physicians Hospitalizations, surgeries, and ER visits in previous 12 months Vitals Screenings to include cognitive, depression, and falls Referrals and appointments  In addition, I have reviewed and discussed with patient certain preventive protocols, quality metrics, and best practice recommendations. A written personalized care plan for preventive services as well as general preventive health recommendations were provided to patient.   Due to this being a telephonic visit, the after visit summary with patients personalized plan was offered to patient via mail or my-chart. Patient would like to access on my-chart.   Loma Messing, LPN  [id number]   Nurse Health Advisor  Nurse Notes:  none

## 2021-11-04 ENCOUNTER — Ambulatory Visit (INDEPENDENT_AMBULATORY_CARE_PROVIDER_SITE_OTHER): Payer: Medicare Other

## 2021-11-04 VITALS — Ht 63.0 in | Wt 171.0 lb

## 2021-11-04 DIAGNOSIS — Z0000 Encounter for general adult medical examination without abnormal findings: Secondary | ICD-10-CM | POA: Diagnosis not present

## 2021-11-04 DIAGNOSIS — Z1211 Encounter for screening for malignant neoplasm of colon: Secondary | ICD-10-CM | POA: Diagnosis not present

## 2021-11-04 DIAGNOSIS — Z780 Asymptomatic menopausal state: Secondary | ICD-10-CM

## 2021-11-04 NOTE — Patient Instructions (Addendum)
Kimberly Hartman , Thank you for taking time to complete your Medicare Wellness Visit. I appreciate your ongoing commitment to your health goals. Please review the following plan we discussed and let me know if I can assist you in the future.   Screening recommendations/referrals: Colonoscopy: up to date, completed 02/18/19, due 02/17/22, referral placed today Mammogram: up to date, 02/26/21, due 02/26/22 Bone Density: up to date, 01/21/20, due 01/20/22, ordered today someone will call to schedule an appointment Recommended yearly ophthalmology/optometry visit for glaucoma screening and checkup Recommended yearly dental visit for hygiene and checkup  Vaccinations: Influenza vaccine: up to date  Pneumococcal vaccine: up to date Tdap vaccine: Due- last completed 11/14/11, May obtain vaccine at your local pharmacy. Shingles vaccine: up to date   Covid-19:up to date  Advanced directives: copy on file   Conditions/risks identified: see problem list   Next appointment: Follow up in one year for your annual wellness visit 11/09/22 @ 9:00am, this will be a telephone visit    Preventive Care 17 Years and Older, Female Preventive care refers to lifestyle choices and visits with your health care provider that can promote health and wellness. What does preventive care include? A yearly physical exam. This is also called an annual well check. Dental exams once or twice a year. Routine eye exams. Ask your health care provider how often you should have your eyes checked. Personal lifestyle choices, including: Daily care of your teeth and gums. Regular physical activity. Eating a healthy diet. Avoiding tobacco and drug use. Limiting alcohol use. Practicing safe sex. Taking low-dose aspirin every day. Taking vitamin and mineral supplements as recommended by your health care provider. What happens during an annual well check? The services and screenings done by your health care provider during your annual well  check will depend on your age, overall health, lifestyle risk factors, and family history of disease. Counseling  Your health care provider may ask you questions about your: Alcohol use. Tobacco use. Drug use. Emotional well-being. Home and relationship well-being. Sexual activity. Eating habits. History of falls. Memory and ability to understand (cognition). Work and work Statistician. Reproductive health. Screening  You may have the following tests or measurements: Height, weight, and BMI. Blood pressure. Lipid and cholesterol levels. These may be checked every 5 years, or more frequently if you are over 39 years old. Skin check. Lung cancer screening. You may have this screening every year starting at age 44 if you have a 30-pack-year history of smoking and currently smoke or have quit within the past 15 years. Fecal occult blood test (FOBT) of the stool. You may have this test every year starting at age 72. Flexible sigmoidoscopy or colonoscopy. You may have a sigmoidoscopy every 5 years or a colonoscopy every 10 years starting at age 38. Hepatitis C blood test. Hepatitis B blood test. Sexually transmitted disease (STD) testing. Diabetes screening. This is done by checking your blood sugar (glucose) after you have not eaten for a while (fasting). You may have this done every 1-3 years. Bone density scan. This is done to screen for osteoporosis. You may have this done starting at age 83. Mammogram. This may be done every 1-2 years. Talk to your health care provider about how often you should have regular mammograms. Talk with your health care provider about your test results, treatment options, and if necessary, the need for more tests. Vaccines  Your health care provider may recommend certain vaccines, such as: Influenza vaccine. This is recommended every year.  Tetanus, diphtheria, and acellular pertussis (Tdap, Td) vaccine. You may need a Td booster every 10 years. Zoster  vaccine. You may need this after age 14. Pneumococcal 13-valent conjugate (PCV13) vaccine. One dose is recommended after age 70. Pneumococcal polysaccharide (PPSV23) vaccine. One dose is recommended after age 40. Talk to your health care provider about which screenings and vaccines you need and how often you need them. This information is not intended to replace advice given to you by your health care provider. Make sure you discuss any questions you have with your health care provider. Document Released: 10/16/2015 Document Revised: 06/08/2016 Document Reviewed: 07/21/2015 Elsevier Interactive Patient Education  2017 [employer] Prevention in the Home Falls can cause injuries. They can happen to people of all ages. There are many things you can do to make your home safe and to help prevent falls. What can I do on the outside of my home? Regularly fix the edges of walkways and driveways and fix any cracks. Remove anything that might make you trip as you walk through a door, such as a raised step or threshold. Trim any bushes or trees on the path to your home. Use bright outdoor lighting. Clear any walking paths of anything that might make someone trip, such as rocks or tools. Regularly check to see if handrails are loose or broken. Make sure that both sides of any steps have handrails. Any raised decks and porches should have guardrails on the edges. Have any leaves, snow, or ice cleared regularly. Use sand or salt on walking paths during winter. Clean up any spills in your garage right away. This includes oil or grease spills. What can I do in the bathroom? Use night lights. Install grab bars by the toilet and in the tub and shower. Do not use towel bars as grab bars. Use non-skid mats or decals in the tub or shower. If you need to sit down in the shower, use a plastic, non-slip stool. Keep the floor dry. Clean up any water that spills on the floor as soon as it happens. Remove  soap buildup in the tub or shower regularly. Attach bath mats securely with double-sided non-slip rug tape. Do not have throw rugs and other things on the floor that can make you trip. What can I do in the bedroom? Use night lights. Make sure that you have a light by your bed that is easy to reach. Do not use any sheets or blankets that are too big for your bed. They should not hang down onto the floor. Have a firm chair that has side arms. You can use this for support while you get dressed. Do not have throw rugs and other things on the floor that can make you trip. What can I do in the kitchen? Clean up any spills right away. Avoid walking on wet floors. Keep items that you use a lot in easy-to-reach places. If you need to reach something above you, use a strong step stool that has a grab bar. Keep electrical cords out of the way. Do not use floor polish or wax that makes floors slippery. If you must use wax, use non-skid floor wax. Do not have throw rugs and other things on the floor that can make you trip. What can I do with my stairs? Do not leave any items on the stairs. Make sure that there are handrails on both sides of the stairs and use them. Fix handrails that are broken or loose.  Make sure that handrails are as long as the stairways. Check any carpeting to make sure that it is firmly attached to the stairs. Fix any carpet that is loose or worn. Avoid having throw rugs at the top or bottom of the stairs. If you do have throw rugs, attach them to the floor with carpet tape. Make sure that you have a light switch at the top of the stairs and the bottom of the stairs. If you do not have them, ask someone to add them for you. What else can I do to help prevent falls? Wear shoes that: Do not have high heels. Have rubber bottoms. Are comfortable and fit you well. Are closed at the toe. Do not wear sandals. If you use a stepladder: Make sure that it is fully opened. Do not climb a  closed stepladder. Make sure that both sides of the stepladder are locked into place. Ask someone to hold it for you, if possible. Clearly mark and make sure that you can see: Any grab bars or handrails. First and last steps. Where the edge of each step is. Use tools that help you move around (mobility aids) if they are needed. These include: Canes. Walkers. Scooters. Crutches. Turn on the lights when you go into a dark area. Replace any light bulbs as soon as they burn out. Set up your furniture so you have a clear path. Avoid moving your furniture around. If any of your floors are uneven, fix them. If there are any pets around you, be aware of where they are. Review your medicines with your doctor. Some medicines can make you feel dizzy. This can increase your chance of falling. Ask your doctor what other things that you can do to help prevent falls. This information is not intended to replace advice given to you by your health care provider. Make sure you discuss any questions you have with your health care provider. Document Released: 07/16/2009 Document Revised: 02/25/2016 Document Reviewed: 10/24/2014 Elsevier Interactive Patient Education  2017 [employer].

## 2021-11-06 ENCOUNTER — Other Ambulatory Visit: Payer: Self-pay | Admitting: Family Medicine

## 2021-11-06 DIAGNOSIS — E039 Hypothyroidism, unspecified: Secondary | ICD-10-CM

## 2021-11-10 ENCOUNTER — Ambulatory Visit: Payer: Medicare Other

## 2021-11-17 ENCOUNTER — Other Ambulatory Visit: Payer: Self-pay

## 2021-11-17 ENCOUNTER — Encounter: Payer: Self-pay | Admitting: Family Medicine

## 2021-11-17 ENCOUNTER — Ambulatory Visit (INDEPENDENT_AMBULATORY_CARE_PROVIDER_SITE_OTHER): Payer: Medicare Other | Admitting: Family Medicine

## 2021-11-17 VITALS — BP 110/50 | HR 69 | Temp 97.8°F | Ht 63.0 in | Wt 172.2 lb

## 2021-11-17 DIAGNOSIS — F325 Major depressive disorder, single episode, in full remission: Secondary | ICD-10-CM | POA: Diagnosis not present

## 2021-11-17 DIAGNOSIS — I700 Atherosclerosis of aorta: Secondary | ICD-10-CM

## 2021-11-17 DIAGNOSIS — G4700 Insomnia, unspecified: Secondary | ICD-10-CM

## 2021-11-17 DIAGNOSIS — E039 Hypothyroidism, unspecified: Secondary | ICD-10-CM

## 2021-11-17 DIAGNOSIS — L400 Psoriasis vulgaris: Secondary | ICD-10-CM

## 2021-11-17 DIAGNOSIS — E118 Type 2 diabetes mellitus with unspecified complications: Secondary | ICD-10-CM

## 2021-11-17 DIAGNOSIS — Z23 Encounter for immunization: Secondary | ICD-10-CM | POA: Diagnosis not present

## 2021-11-17 LAB — COMPREHENSIVE METABOLIC PANEL
ALT: 14 U/L (ref 0–35)
AST: 18 U/L (ref 0–37)
Albumin: 4 g/dL (ref 3.5–5.2)
Alkaline Phosphatase: 59 U/L (ref 39–117)
BUN: 20 mg/dL (ref 6–23)
CO2: 30 mEq/L (ref 19–32)
Calcium: 9.5 mg/dL (ref 8.4–10.5)
Chloride: 102 mEq/L (ref 96–112)
Creatinine, Ser: 0.71 mg/dL (ref 0.40–1.20)
GFR: 85.54 mL/min (ref >60.00)
Glucose, Bld: 158 mg/dL — ABNORMAL HIGH (ref 70–99)
Potassium: 4.5 mEq/L (ref 3.5–5.1)
Sodium: 138 mEq/L (ref 135–145)
Total Bilirubin: 0.3 mg/dL (ref 0.2–1.2)
Total Protein: 7.9 g/dL (ref 6.0–8.3)

## 2021-11-17 LAB — CBC WITH DIFFERENTIAL/PLATELET
Basophils Absolute: 0.1 10*3/uL (ref 0.0–0.1)
Basophils Relative: 1.7 % (ref 0.0–3.0)
Eosinophils Absolute: 0.1 10*3/uL (ref 0.0–0.7)
Eosinophils Relative: 1.3 % (ref 0.0–5.0)
HCT: 41.6 % (ref 36.0–46.0)
Hemoglobin: 13.3 g/dL (ref 12.0–15.0)
Lymphocytes Relative: 25.5 % (ref 12.0–46.0)
Lymphs Abs: 1.7 10*3/uL (ref 0.7–4.0)
MCHC: 32.1 g/dL (ref 30.0–36.0)
MCV: 86.5 fl (ref 78.0–100.0)
Monocytes Absolute: 0.5 10*3/uL (ref 0.1–1.0)
Monocytes Relative: 7.8 % (ref 3.0–12.0)
Neutro Abs: 4.2 10*3/uL (ref 1.4–7.7)
Neutrophils Relative %: 63.7 % (ref 43.0–77.0)
Platelets: 290 10*3/uL (ref 150.0–400.0)
RBC: 4.81 Mil/uL (ref 3.87–5.11)
RDW: 14.4 % (ref 11.5–15.5)
WBC: 6.5 10*3/uL (ref 4.0–10.5)

## 2021-11-17 NOTE — Progress Notes (Signed)
Subjective:     Kimberly Hartman is a 71 y.o. female presenting for Medicare Wellness     HPI   #Anxiety/insomnia  - has weaned herself off clonazepam - started with significant life stressors - sleep is still OK - better than it was - has some bad nights - tried melatonin a while ago w/o improvement - will fall asleep easily but waking up in the middle of the night - sometimes bladder wakes her up 1-2 times overnight  #Tobacco  - trying to stay between 1/2-3/4 ppd  - up to date on lung screening  #DM - hemoglobin a1c 7 at last check - working on diet and exercise and weight loss - continuing medication  Review of Systems   Social History   Tobacco Use  Smoking Status Every Day   Packs/day: 0.75   Years: 50.00   Pack years: 37.50   Types: Cigarettes  Smokeless Tobacco Never  Tobacco Comments   up to 1.5 ppd for a period of time        Objective:    BP Readings from Last 3 Encounters:  11/17/21 (!) 110/50  05/13/21 (!) 120/56  12/09/20 118/62   Wt Readings from Last 3 Encounters:  11/17/21 172 lb 3 oz (78.1 kg)  11/04/21 171 lb (77.6 kg)  05/13/21 171 lb 8 oz (77.8 kg)    BP (!) 110/50    Pulse 69    Temp 97.8 F (36.6 C) (Oral)    Ht 5\' 3"  (1.6 m)    Wt 172 lb 3 oz (78.1 kg)    SpO2 98%    BMI 30.50 kg/m    Physical Exam Constitutional:      General: She is not in acute distress.    Appearance: She is well-developed. She is not diaphoretic.  HENT:     Head: Normocephalic and atraumatic.     Right Ear: External ear normal.     Left Ear: External ear normal.     Nose: Nose normal.  Eyes:     General: No scleral icterus.    Extraocular Movements: Extraocular movements intact.     Conjunctiva/sclera: Conjunctivae normal.  Cardiovascular:     Rate and Rhythm: Normal rate and regular rhythm.     Heart sounds: No murmur heard. Pulmonary:     Effort: Pulmonary effort is normal. No respiratory distress.     Breath sounds: Normal breath sounds.  No wheezing.  Abdominal:     General: Bowel sounds are normal. There is no distension.     Palpations: Abdomen is soft. There is no mass.     Tenderness: There is no abdominal tenderness. There is no guarding or rebound.  Musculoskeletal:        General: Normal range of motion.     Cervical back: Neck supple.  Lymphadenopathy:     Cervical: No cervical adenopathy.  Skin:    General: Skin is warm and dry.     Capillary Refill: Capillary refill takes less than 2 seconds.  Neurological:     Mental Status: She is alert and oriented to person, place, and time.     Deep Tendon Reflexes: Reflexes normal.  Psychiatric:        Mood and Affect: Mood normal.        Behavior: Behavior normal.          Assessment & Plan:   Problem List Items Addressed This Visit       Cardiovascular and Mediastinum  Aortic atherosclerosis ([place])    Noted on CT lung. Taking statin. Easy bruising on ASA 81 mg.         Endocrine   Controlled type 2 diabetes mellitus with complication, without long-term current use of insulin ([place])    Last hgba1c 7. Follows with endocrine. Working on diet/weight loss. Cont actos 30 mg, januvia 100 mg      Relevant Orders   Comprehensive metabolic panel   Microalbumin / creatinine urine ratio   Hypothyroid    Follows with endocrine. Last TSH in care everywhere was in range. Cont levo 100 mcg        Musculoskeletal and Integument   Psoriasis vulgaris    Follows with Dr. Ander Slade, appreciate support. Quantiferon Gold and labs per monitoring. Doing well on Secukinumab.       Relevant Orders   Comprehensive metabolic panel   QuantiFERON-TB Gold Plus   CBC with Differential     Other   Depression, major, single episode, complete remission ([facility])    Remission. Cont sertraline 50 mg.       Insomnia - Primary    Has weaned off clonazepam and is doing well. Still some insomnia but overall is unchanged. Encouraged continued healthy sleep habits.       Other Visit  Diagnoses     Need for 23-polyvalent pneumococcal polysaccharide vaccine       Relevant Orders   Pneumococcal polysaccharide vaccine 23-valent greater than or equal to 2yo subcutaneous/IM (Completed)        Return in about 1 year (around 11/17/2022) for annual.  Lesleigh Noe, MD  This visit occurred during the SARS-CoV-2 public health emergency.  Safety protocols were in place, including screening questions prior to the visit, additional usage of staff PPE, and extensive cleaning of exam room while observing appropriate contact time as indicated for disinfecting solutions.

## 2021-11-17 NOTE — Assessment & Plan Note (Signed)
Follows with Dr. Ander Slade, appreciate support. Quantiferon Gold and labs per monitoring. Doing well on Secukinumab.

## 2021-11-17 NOTE — Assessment & Plan Note (Signed)
Has weaned off clonazepam and is doing well. Still some insomnia but overall is unchanged. Encouraged continued healthy sleep habits.

## 2021-11-17 NOTE — Patient Instructions (Signed)
If you don't hear from GI by May 2023 - call them for colonoscopy  Will plan for Dexa next year

## 2021-11-17 NOTE — Assessment & Plan Note (Addendum)
Follows with endocrine. Last TSH in care everywhere was in range. Cont levo 100 mcg

## 2021-11-17 NOTE — Assessment & Plan Note (Signed)
Last hgba1c 7. Follows with endocrine. Working on diet/weight loss. Cont actos 30 mg, januvia 100 mg

## 2021-11-17 NOTE — Assessment & Plan Note (Signed)
Noted on CT lung. Taking statin. Easy bruising on ASA 81 mg.

## 2021-11-17 NOTE — Assessment & Plan Note (Signed)
Remission. Cont sertraline 50 mg.

## 2021-11-18 ENCOUNTER — Encounter: Payer: Self-pay | Admitting: Family Medicine

## 2021-11-18 ENCOUNTER — Ambulatory Visit (INDEPENDENT_AMBULATORY_CARE_PROVIDER_SITE_OTHER): Payer: Medicare Other | Admitting: Dermatology

## 2021-11-18 ENCOUNTER — Encounter: Payer: Self-pay | Admitting: Dermatology

## 2021-11-18 DIAGNOSIS — L409 Psoriasis, unspecified: Secondary | ICD-10-CM | POA: Diagnosis not present

## 2021-11-18 DIAGNOSIS — R21 Rash and other nonspecific skin eruption: Secondary | ICD-10-CM

## 2021-11-18 DIAGNOSIS — L988 Other specified disorders of the skin and subcutaneous tissue: Secondary | ICD-10-CM

## 2021-11-18 LAB — MICROALBUMIN / CREATININE URINE RATIO
Creatinine,U: 179.9 mg/dL
Microalb Creat Ratio: 2.6 mg/g (ref 0.0–30.0)
Microalb, Ur: 4.7 mg/dL — ABNORMAL HIGH (ref 0.0–1.9)

## 2021-11-18 NOTE — Progress Notes (Signed)
° °  Follow-Up Visit   Subjective  Kimberly Hartman is a 71 y.o. female who presents for the following: Psoriasis (6 month recheck. Psoriasis on feet. On Cosentyx subq every month. Using Calcipotriene and Betamethasone cream QD PRN ) and Rash (Check rash on back. Itching, spreading. Does not think is psoriasis. Used Calcipotriene cream did not help).    The following portions of the chart were reviewed this encounter and updated as appropriate:      Review of Systems: No other skin or systemic complaints except as noted in HPI or Assessment and Plan.   Objective  Well appearing patient in no apparent distress; mood and affect are within normal limits.  A focused examination was performed including face, arms, legs, feet, back. Relevant physical exam findings are noted in the Assessment and Plan.  B/L feet Feet clear today. Normal appearing skin.  Mid Back Scaly pink papules coalescing to plaques at upper and mid back   face Rhytides and volume loss.    Assessment & Plan  Psoriasis B/L feet  Psoriasis is a chronic non-curable, but treatable genetic/hereditary disease that may have other systemic features affecting other organ systems such as joints (Psoriatic Arthritis). It is associated with an increased risk of inflammatory bowel disease, heart disease, non-alcoholic fatty liver disease, and depression.    Continue Cosentyx once a month as directed.   2 sample boxes given: Lot: SFPFS Lot: [id number] Exp: 11/2022  TB Gold drawn yesterday, results pending. Continue Calcipotriene/Betamethasone cream as needed as directed.  Rash Mid Back  Eczema  Chronic and persistent condition with duration or expected duration over one year. Condition is bothersome/symptomatic for patient. Currently flared.  Use Betamethasone cream twice daily until clear. Avoid applying to face, groin, and axilla. Use as directed. Long-term use can cause thinning of the skin.    Elastosis of  skin face  RTC for fillers. Recommend chemical peel.   Voluma mid face with cannula.   Return in about 6 months (around 05/18/2022) for Psoriasis Follow Up.  I, Emelia Salisbury, CMA, am acting as scribe for Forest Gleason, MD.  Documentation: I have reviewed the above documentation for accuracy and completeness, and I agree with the above.  Forest Gleason, MD

## 2021-11-18 NOTE — Patient Instructions (Addendum)
Reviewed risks of biologics including immunosuppression, infections, injection site reaction, and failure to improve condition. Goal is control of skin condition, not cure.  Some older biologics such as Humira and Enbrel may slightly increase risk of malignancy and may worsen congestive heart failure. The use of biologics requires long term medication management, including periodic office visits and monitoring of blood work.   Eczema on Back:   Use Betamethasone cream twice daily until clear. Avoid applying to face, groin, and axilla. Use as directed. Long-term use can cause thinning of the skin.  Psoriasis:   Continue Cosentyx once a month as directed.  Continue Calcipotriene/Betamethasone cream as needed as directed.  Face:  Recommended non-comedogenic (non-acne causing) facial oils include 100% argan oil or squalane. The can be used after applying any recommended creams or ointments to the skin in the evening. The Ordinary Brand has a high-quality and affordable version of both of these and can be found at [place].    Gentle Skin Care Guide  1. Bathe no more than once a day.  2. Avoid bathing in hot water  3. Use a mild soap like Dove, Vanicream, Cetaphil, CeraVe. Can use Lever 2000 or Cetaphil antibacterial soap  4. Use soap only where you need it. On most days, use it under your arms, between your legs, and on your feet. Let the water rinse other areas unless visibly dirty.  5. When you get out of the bath/shower, use a towel to gently blot your skin dry, don't rub it.  6. While your skin is still a little damp, apply a moisturizing cream such as Vanicream, CeraVe, Cetaphil, Eucerin, Sarna lotion or plain Vaseline Jelly. For hands apply Neutrogena [place] Hand Cream or Excipial Hand Cream.  7. Reapply moisturizer any time you start to itch or feel dry.  8. Sometimes using free and clear laundry detergents can be helpful. Fabric softener sheets should be avoided. Downy  Free & Gentle liquid, or any liquid fabric softener that is free of dyes and perfumes, it acceptable to use  9. If your doctor has given you prescription creams you may apply moisturizers over them   If You Need Anything After Your Visit  If you have any questions or concerns for your doctor, please call our main line at [phone] and press option 4 to reach your doctor's medical assistant. If no one answers, please leave a voicemail as directed and we will return your call as soon as possible. Messages left after 4 pm will be answered the following business day.   You may also send [place] a message via [place]. We typically respond to MyChart messages within 1-2 business days.  For prescription refills, please ask your pharmacy to contact our office. Our fax number is [phone].  If you have an urgent issue when the clinic is closed that cannot wait until the next business day, you can page your doctor at the number below.    Please note that while we do our best to be available for urgent issues outside of office hours, we are not available 24/7.   If you have an urgent issue and are unable to reach [place], you may choose to seek medical care at your doctor's office, retail clinic, urgent care center, or emergency room.  If you have a medical emergency, please immediately call 911 or go to the emergency department.  Pager Numbers  - Dr. Nehemiah Massed: [phone]  - Dr. Laurence Ferrari: [phone]  - Dr. Nicole Kindred: [phone]  In  the event of inclement weather, please call our main line at [phone] for an update on the status of any delays or closures.  Dermatology Medication Tips: Please keep the boxes that topical medications come in in order to help keep track of the instructions about where and how to use these. Pharmacies typically print the medication instructions only on the boxes and not directly on the medication tubes.   If your medication is too expensive, please contact our office  at [phone] option 4 or send [place] a message through [place].   We are unable to tell what your co-pay for medications will be in advance as this is different depending on your insurance coverage. However, we may be able to find a substitute medication at lower cost or fill out paperwork to get insurance to cover a needed medication.   If a prior authorization is required to get your medication covered by your insurance company, please allow [place] 1-2 business days to complete this process.  Drug prices often vary depending on where the prescription is filled and some pharmacies may offer cheaper prices.  The website www.goodrx.com contains coupons for medications through different pharmacies. The prices here do not account for what the cost may be with help from insurance (it may be cheaper with your insurance), but the website can give you the price if you did not use any insurance.  - You can print the associated coupon and take it with your prescription to the pharmacy.  - You may also stop by our office during regular business hours and pick up a GoodRx coupon card.  - If you need your prescription sent electronically to a different pharmacy, notify our office through [facility] or by phone at [phone] option 4.     Si Usted Necesita Algo Despus de Su Visita  Tambin puede enviarnos un mensaje a travs de Pharmacist, community. Por lo general respondemos a los mensajes de MyChart en el transcurso de 1 a 2 das hbiles.  Para renovar recetas, por favor pida a su farmacia que se ponga en contacto con nuestra oficina. Harland Dingwall de fax es [place] [phone].  Si tiene un asunto urgente cuando la clnica est cerrada y que no puede esperar hasta el siguiente da hbil, puede llamar/localizar a su doctor(a) al nmero que aparece a continuacin.   Por favor, tenga en cuenta que aunque hacemos todo lo posible para estar disponibles para asuntos urgentes fuera del horario de [place], no estamos  disponibles las 24 horas del da, los 7 das de la [place].   Si tiene un problema urgente y no puede comunicarse con nosotros, puede optar por buscar atencin mdica  en el consultorio de su doctor(a), en una clnica privada, en un centro de atencin urgente o en una sala de emergencias.  Si tiene Engineering geologist, por favor llame inmediatamente al 911 o vaya a la sala de emergencias.  Nmeros de bper  - Dr. Nehemiah Massed: [phone]  - Dra. Moye: [phone]  - Dra. Nicole Kindred: [phone]  En caso de inclemencias del [place], por favor llame a Johnsie Kindred principal al [phone] para una actualizacin sobre el [place] de cualquier retraso o cierre.  Consejos para la medicacin en dermatologa: Por favor, guarde las cajas en las que vienen los medicamentos de uso tpico para ayudarle a seguir las instrucciones sobre dnde y cmo usarlos. Las farmacias generalmente imprimen las instrucciones del medicamento slo en las cajas y no directamente en los tubos del [place].   Si  su medicamento es muy caro, por favor, pngase en contacto con Zigmund Daniel llamando al [phone] y presione la opcin 4 o envenos un mensaje a travs de Pharmacist, community.   No podemos decirle cul ser su copago por los medicamentos por adelantado ya que esto es diferente dependiendo de la cobertura de su seguro. Sin embargo, es posible que podamos encontrar un medicamento sustituto a Electrical engineer un formulario para que el seguro cubra el medicamento que se considera necesario.   Si se requiere una autorizacin previa para que su compaa de seguros [place] su medicamento, por favor permtanos de 1 a 2 das hbiles para completar este proceso.  Los precios de los medicamentos varan con frecuencia dependiendo del Environmental consultant de dnde se surte la receta y alguna farmacias pueden ofrecer precios ms baratos.  El sitio web www.goodrx.com tiene cupones para medicamentos de Airline pilot. Los precios aqu no  tienen en cuenta lo que podra costar con la ayuda del seguro (puede ser ms barato con su seguro), pero el sitio web puede darle el precio si no utiliz Research scientist (physical sciences).  - Puede imprimir el cupn correspondiente y llevarlo con su receta a la farmacia.  - Tambin puede pasar por nuestra oficina durante el horario de atencin regular y Charity fundraiser una tarjeta de cupones de GoodRx.  - Si necesita que su receta se enve electrnicamente a una farmacia diferente, informe a nuestra oficina a travs de MyChart de [place] o por telfono llamando al [phone] y presione la opcin 4.

## 2021-11-19 ENCOUNTER — Other Ambulatory Visit: Payer: Self-pay | Admitting: Family Medicine

## 2021-11-19 DIAGNOSIS — Z1231 Encounter for screening mammogram for malignant neoplasm of breast: Secondary | ICD-10-CM

## 2021-11-20 LAB — QUANTIFERON-TB GOLD PLUS
Mitogen-NIL: >10 IU/mL
NIL: 0.03 IU/mL
QuantiFERON-TB Gold Plus: NEGATIVE
TB1-NIL: 0 IU/mL
TB2-NIL: 0 IU/mL

## 2021-11-23 ENCOUNTER — Other Ambulatory Visit: Payer: Self-pay | Admitting: Family Medicine

## 2021-11-23 DIAGNOSIS — E118 Type 2 diabetes mellitus with unspecified complications: Secondary | ICD-10-CM

## 2021-11-23 NOTE — Telephone Encounter (Signed)
Should this come from endocrinologist?

## 2021-11-29 ENCOUNTER — Encounter: Payer: Self-pay | Admitting: Dermatology

## 2021-11-29 NOTE — Progress Notes (Signed)
Quantiferon gold negative. Will continue Cosentyx.

## 2021-12-14 ENCOUNTER — Ambulatory Visit: Payer: Medicare Other | Admitting: Dermatology

## 2021-12-14 ENCOUNTER — Other Ambulatory Visit: Payer: Self-pay | Admitting: Family Medicine

## 2021-12-14 DIAGNOSIS — F321 Major depressive disorder, single episode, moderate: Secondary | ICD-10-CM

## 2022-01-05 ENCOUNTER — Other Ambulatory Visit: Payer: Self-pay | Admitting: *Deleted

## 2022-01-05 DIAGNOSIS — Z122 Encounter for screening for malignant neoplasm of respiratory organs: Secondary | ICD-10-CM

## 2022-01-05 DIAGNOSIS — F17210 Nicotine dependence, cigarettes, uncomplicated: Secondary | ICD-10-CM

## 2022-01-05 DIAGNOSIS — Z87891 Personal history of nicotine dependence: Secondary | ICD-10-CM

## 2022-01-20 ENCOUNTER — Ambulatory Visit
Admission: RE | Admit: 2022-01-20 | Discharge: 2022-01-20 | Disposition: A | Discharge status: Home / Self Care | Payer: Medicare Other | Source: Ambulatory Visit | Attending: Acute Care | Admitting: Acute Care

## 2022-01-20 DIAGNOSIS — I2510 Atherosclerotic heart disease of native coronary artery without angina pectoris: Secondary | ICD-10-CM | POA: Diagnosis not present

## 2022-01-20 DIAGNOSIS — J432 Centrilobular emphysema: Secondary | ICD-10-CM | POA: Diagnosis not present

## 2022-01-20 DIAGNOSIS — Z87891 Personal history of nicotine dependence: Secondary | ICD-10-CM

## 2022-01-20 DIAGNOSIS — I700 Atherosclerosis of aorta: Secondary | ICD-10-CM | POA: Diagnosis not present

## 2022-01-20 DIAGNOSIS — Z122 Encounter for screening for malignant neoplasm of respiratory organs: Secondary | ICD-10-CM

## 2022-01-20 DIAGNOSIS — F17210 Nicotine dependence, cigarettes, uncomplicated: Secondary | ICD-10-CM | POA: Diagnosis not present

## 2022-01-21 ENCOUNTER — Other Ambulatory Visit: Payer: Self-pay | Admitting: Acute Care

## 2022-01-21 DIAGNOSIS — F17210 Nicotine dependence, cigarettes, uncomplicated: Secondary | ICD-10-CM

## 2022-01-21 DIAGNOSIS — Z122 Encounter for screening for malignant neoplasm of respiratory organs: Secondary | ICD-10-CM

## 2022-01-21 DIAGNOSIS — Z87891 Personal history of nicotine dependence: Secondary | ICD-10-CM

## 2022-01-25 ENCOUNTER — Other Ambulatory Visit: Payer: Self-pay | Admitting: Family Medicine

## 2022-01-25 DIAGNOSIS — E039 Hypothyroidism, unspecified: Secondary | ICD-10-CM

## 2022-01-31 DIAGNOSIS — U071 COVID-19: Secondary | ICD-10-CM | POA: Diagnosis not present

## 2022-02-14 ENCOUNTER — Encounter: Payer: Self-pay | Admitting: Gastroenterology

## 2022-02-14 ENCOUNTER — Telehealth: Payer: Self-pay | Admitting: Gastroenterology

## 2022-02-14 NOTE — Telephone Encounter (Signed)
History of multiple colonic tubular adenomas in 2020 and she is due now, 01/2022, for colonoscopy. Please schedule.  ?

## 2022-02-14 NOTE — Telephone Encounter (Signed)
Hi Dr. Fuller Plan, ? ?This patient is on recall for May 2023.  As there was no note or letter regarding the new guidelines, I wanted you to review her information and let me know if I can go ahead and schedule her or if her date has changed.  Thank you for your time. ?

## 2022-02-15 ENCOUNTER — Telehealth: Payer: Self-pay

## 2022-02-15 NOTE — Telephone Encounter (Signed)
Patient picked up two samples of Cosentyx today. ? ?LOT: [id number] ?EXP: 04/2023 ? ?Johnsie Kindred, RMA ?

## 2022-03-02 ENCOUNTER — Ambulatory Visit
Admission: RE | Admit: 2022-03-02 | Discharge: 2022-03-02 | Disposition: A | Discharge status: Home / Self Care | Payer: Medicare Other | Source: Ambulatory Visit | Attending: Family Medicine | Admitting: Family Medicine

## 2022-03-02 DIAGNOSIS — Z1231 Encounter for screening mammogram for malignant neoplasm of breast: Secondary | ICD-10-CM | POA: Diagnosis not present

## 2022-03-09 DIAGNOSIS — E785 Hyperlipidemia, unspecified: Secondary | ICD-10-CM | POA: Diagnosis not present

## 2022-03-09 DIAGNOSIS — E039 Hypothyroidism, unspecified: Secondary | ICD-10-CM | POA: Diagnosis not present

## 2022-03-09 DIAGNOSIS — R5383 Other fatigue: Secondary | ICD-10-CM | POA: Diagnosis not present

## 2022-03-09 DIAGNOSIS — E1169 Type 2 diabetes mellitus with other specified complication: Secondary | ICD-10-CM | POA: Diagnosis not present

## 2022-03-09 LAB — HEMOGLOBIN A1C: Hemoglobin A1C: 7.2

## 2022-03-29 ENCOUNTER — Telehealth: Payer: Self-pay

## 2022-03-30 ENCOUNTER — Ambulatory Visit (AMBULATORY_SURGERY_CENTER): Payer: Self-pay | Admitting: *Deleted

## 2022-03-30 VITALS — Ht 63.0 in | Wt 171.0 lb

## 2022-03-30 DIAGNOSIS — Z86010 Personal history of colonic polyps: Secondary | ICD-10-CM

## 2022-03-30 MED ORDER — NA SULFATE-K SULFATE-MG SULF 17.5-3.13-1.6 GM/177ML PO SOLN: 1.0000 | Freq: Once | ORAL | 0 refills | Status: AC

## 2022-03-30 NOTE — Progress Notes (Signed)
No egg or soy allergy known to patient  No issues known to pt with past sedation with any surgeries or procedures Patient denies ever being told they had issues or difficulty with intubation  No FH of Malignant Hyperthermia Pt is not on diet pills Pt is not on  home 02  Pt is not on blood thinners   Pt denies issues with constipation  but x 1 yr now, not formed poop, more of a blob, no blood, no mucus- not hard- wears a pad because of leakage at times   No A fib or A flutter  Singlecare card/ Coupon to pt in PV today ,and  NO PA's for preps discussed with pt In PV today  Discussed with pt there will be an out-of-pocket cost for prep and that varies from $0 to 70 +  dollars - pt verbalized understanding  Pt instructed to use Singlecare.com or GoodRx for a price reduction on prep

## 2022-04-02 ENCOUNTER — Other Ambulatory Visit: Payer: Self-pay | Admitting: Family Medicine

## 2022-04-02 DIAGNOSIS — E119 Type 2 diabetes mellitus without complications: Secondary | ICD-10-CM

## 2022-04-06 ENCOUNTER — Other Ambulatory Visit: Payer: Self-pay

## 2022-04-06 DIAGNOSIS — E119 Type 2 diabetes mellitus without complications: Secondary | ICD-10-CM

## 2022-04-06 MED ORDER — ATORVASTATIN CALCIUM 40 MG PO TABS: 40.0000 mg | ORAL_TABLET | Freq: Every day | ORAL | 2 refills | Status: DC

## 2022-04-06 NOTE — Telephone Encounter (Signed)
I sent in 9 months worth. I overlooked her last appt.

## 2022-04-18 ENCOUNTER — Encounter: Payer: Self-pay | Admitting: Gastroenterology

## 2022-04-18 ENCOUNTER — Ambulatory Visit (AMBULATORY_SURGERY_CENTER): Payer: Medicare Other | Admitting: Gastroenterology

## 2022-04-18 VITALS — BP 108/69 | HR 62 | Temp 97.4°F | Resp 17 | Ht 63.0 in | Wt 171.0 lb

## 2022-04-18 DIAGNOSIS — Z86010 Personal history of colonic polyps: Secondary | ICD-10-CM | POA: Diagnosis not present

## 2022-04-18 DIAGNOSIS — D125 Benign neoplasm of sigmoid colon: Secondary | ICD-10-CM

## 2022-04-18 DIAGNOSIS — Z09 Encounter for follow-up examination after completed treatment for conditions other than malignant neoplasm: Secondary | ICD-10-CM

## 2022-04-18 DIAGNOSIS — D123 Benign neoplasm of transverse colon: Secondary | ICD-10-CM

## 2022-04-18 DIAGNOSIS — E119 Type 2 diabetes mellitus without complications: Secondary | ICD-10-CM | POA: Diagnosis not present

## 2022-04-18 DIAGNOSIS — K635 Polyp of colon: Secondary | ICD-10-CM

## 2022-04-18 DIAGNOSIS — F419 Anxiety disorder, unspecified: Secondary | ICD-10-CM | POA: Diagnosis not present

## 2022-04-18 DIAGNOSIS — J449 Chronic obstructive pulmonary disease, unspecified: Secondary | ICD-10-CM | POA: Diagnosis not present

## 2022-04-18 MED ORDER — SODIUM CHLORIDE 0.9 % IV SOLN: 500.0000 mL | Freq: Once | INTRAVENOUS | Status: DC

## 2022-04-18 NOTE — Progress Notes (Signed)
Called to room to assist during endoscopic procedure.  Patient ID and intended procedure confirmed with present staff. Received instructions for my participation in the procedure from the performing physician.  

## 2022-04-18 NOTE — Progress Notes (Signed)
Vss nad trans to pacu °

## 2022-04-18 NOTE — Patient Instructions (Signed)
  HIGH FIBER DIET.  KEGEL EXERCISES 5 TIMES A DAY LONG TERM.     YOU HAD AN ENDOSCOPIC PROCEDURE TODAY AT [facility]:   Refer to the procedure report that was given to you for any specific questions about what was found during the examination.  If the procedure report does not answer your questions, please call your gastroenterologist to clarify.  If you requested that your care partner not be given the details of your procedure findings, then the procedure report has been included in a sealed envelope for you to review at your convenience later.  YOU SHOULD EXPECT: Some feelings of bloating in the abdomen. Passage of more gas than usual.  Walking can help get rid of the air that was put into your GI tract during the procedure and reduce the bloating. If you had a lower endoscopy (such as a colonoscopy or flexible sigmoidoscopy) you may notice spotting of blood in your stool or on the toilet paper. If you underwent a bowel prep for your procedure, you may not have a normal bowel movement for a few days.  Please Note:  You might notice some irritation and congestion in your nose or some drainage.  This is from the oxygen used during your procedure.  There is no need for concern and it should clear up in a day or so.  SYMPTOMS TO REPORT IMMEDIATELY:  Following lower endoscopy (colonoscopy or flexible sigmoidoscopy):  Excessive amounts of blood in the stool  Significant tenderness or worsening of abdominal pains  Swelling of the abdomen that is new, acute  Fever of 100F or higher    For urgent or emergent issues, a gastroenterologist can be reached at any hour by calling [phone]. Do not use MyChart messaging for urgent concerns.    DIET:  We do recommend a small meal at first, but then you may proceed to your regular diet.  Drink plenty of fluids but you should avoid alcoholic beverages for 24 hours.  ACTIVITY:  You should plan to take it easy for the rest of today  and you should NOT DRIVE or use heavy machinery until tomorrow (because of the sedation medicines used during the test).    FOLLOW UP: Our staff will call the number listed on your records the next business day following your procedure.  We will call around 7:15- 8:00 am to check on you and address any questions or concerns that you may have regarding the information given to you following your procedure. If we do not reach you, we will leave a message.  If you develop any symptoms (ie: fever, flu-like symptoms, shortness of breath, cough etc.) before then, please call [phone].  If you test positive for Covid 19 in the 2 weeks post procedure, please call and report this information to [place].    If any biopsies were taken you will be contacted by phone or by letter within the next 1-3 weeks.  Please call us at [phone] if you have not heard about the biopsies in 3 weeks.    SIGNATURES/CONFIDENTIALITY: You and/or your care partner have signed paperwork which will be entered into your electronic medical record.  These signatures attest to the fact that that the information above on your After Visit Summary has been reviewed and is understood.  Full responsibility of the confidentiality of this discharge information lies with you and/or your care-partner.

## 2022-04-18 NOTE — Progress Notes (Signed)
History & Physical  Primary Care Physician:  Lesleigh Noe, MD Primary Gastroenterologist: Lucio Edward, MD  CHIEF COMPLAINT:  Personal history of colon polyps   HPI: Kimberly Hartman is a 71 y.o. female with a personal history of multiple adenomatous colon polyps on colonoscopy in 2020 for surveillance colonoscopy.   Past Medical History:  Diagnosis Date   Allergy    Anxiety    Arthritis    Cataract    removed both , lens implants   COPD (chronic obstructive pulmonary disease) ([place])    early stages per Pulm function test- pt has no issues - sat 97-98% per pt   Diabetes mellitus without complication ([place])    Psoriasis    Thyroid disease     Past Surgical History:  Procedure Laterality Date   ABDOMINAL HYSTERECTOMY  2001   [place]   BREAST EXCISIONAL BIOPSY Right    benign   COLONOSCOPY     DENTAL SURGERY     EYE SURGERY  2018   Cataract   KNEE ARTHROSCOPY WITH MENISCAL REPAIR Right    x2   POLYPECTOMY      Prior to Admission medications   Medication Sig Start Date End Date Taking? Authorizing Provider  atorvastatin (LIPITOR) 40 MG tablet Take 1 tablet (40 mg total) by mouth daily. 04/06/22  Yes Lesleigh Noe, MD  Calcium Carbonate-Vitamin D (CALCIUM-D) 600-400 MG-UNIT TABS Take 2 tablets by mouth daily.   Yes [provider]  cetirizine (ZYRTEC) 10 MG tablet Take 10 mg by mouth daily as needed for allergies.   Yes [provider]  Cholecalciferol (VITAMIN D3) 1000 units CAPS Take 1 capsule by mouth daily.   Yes [provider]  glucose blood (ONE TOUCH ULTRA TEST) test strip Use to check blood sugar once a day. 05/07/20  Yes Elby Beck, FNP  levothyroxine (SYNTHROID) 100 MCG tablet TAKE 1 TABLET BY MOUTH IN THE  MORNING 01/26/22  Yes Lesleigh Noe, MD  Multiple Vitamins-Minerals (MULTIVITAMIN ADULTS 50+) TABS Take 1 tablet by mouth daily.   Yes [provider]   pioglitazone (ACTOS) 45 MG tablet Take 45 mg by mouth daily. 03/09/22  Yes [provider]  acetaminophen (TYLENOL) 325 MG tablet Take 650 mg by mouth every 6 (six) hours as needed.    [provider]  augmented betamethasone dipropionate (DIPROLENE-AF) 0.05 % ointment     [provider]  diphenhydrAMINE (BENADRYL) 25 MG tablet Take 25 mg by mouth daily as needed. Patient not taking: Reported on 04/18/2022    [provider]  fluticasone (FLONASE) 50 MCG/ACT nasal spray Place into both nostrils daily as needed for allergies or rhinitis.    [provider]  hydrocortisone 2.5 % cream Apply topically 2 (two) times daily. Apply to rash between the breast BID prn x up to 2 weeks. Patient not taking: Reported on 04/18/2022 05/29/21   Laurence Ferrari, [place], MD  Ibuprofen 200 MG CAPS Take 2 tablets by mouth daily as needed. Patient not taking: Reported on 04/18/2022    [provider]  JANUVIA 100 MG tablet TAKE 1 TABLET BY MOUTH  DAILY 11/23/21   Lesleigh Noe, MD  Secukinumab (COSENTYX SENSOREADY PEN [place]) 1 injection ('300mg'$ ) every 4 weeks. 06/06/19   [provider]  sertraline (ZOLOFT) 50 MG tablet TAKE 2 TABLETS BY MOUTH DAILY 12/14/21   Lesleigh Noe, MD    Current  Outpatient Medications  Medication Sig Dispense Refill   atorvastatin (LIPITOR) 40 MG tablet Take 1 tablet (40 mg total) by mouth daily. 90 tablet 2   Calcium Carbonate-Vitamin D (CALCIUM-D) 600-400 MG-UNIT TABS Take 2 tablets by mouth daily.     cetirizine (ZYRTEC) 10 MG tablet Take 10 mg by mouth daily as needed for allergies.     Cholecalciferol (VITAMIN D3) 1000 units CAPS Take 1 capsule by mouth daily.     glucose blood (ONE TOUCH ULTRA TEST) test strip Use to check blood sugar once a day. 50 each 3   levothyroxine (SYNTHROID) 100 MCG tablet TAKE 1 TABLET BY MOUTH IN THE  MORNING 90 tablet 3   Multiple Vitamins-Minerals (MULTIVITAMIN ADULTS 50+) TABS Take 1 tablet by mouth  daily.     pioglitazone (ACTOS) 45 MG tablet Take 45 mg by mouth daily.     acetaminophen (TYLENOL) 325 MG tablet Take 650 mg by mouth every 6 (six) hours as needed.     augmented betamethasone dipropionate (DIPROLENE-AF) 0.05 % ointment  (Patient not taking: Reported on 03/30/2022)     diphenhydrAMINE (BENADRYL) 25 MG tablet Take 25 mg by mouth daily as needed. (Patient not taking: Reported on 04/18/2022)     fluticasone (FLONASE) 50 MCG/ACT nasal spray Place into both nostrils daily as needed for allergies or rhinitis.     hydrocortisone 2.5 % cream Apply topically 2 (two) times daily. Apply to rash between the breast BID prn x up to 2 weeks. (Patient not taking: Reported on 04/18/2022) 28 g 0   Ibuprofen 200 MG CAPS Take 2 tablets by mouth daily as needed. (Patient not taking: Reported on 04/18/2022)     JANUVIA 100 MG tablet TAKE 1 TABLET BY MOUTH  DAILY 90 tablet 3   Secukinumab (COSENTYX SENSOREADY PEN [place]) 1 injection ('300mg'$ ) every 4 weeks.     sertraline (ZOLOFT) 50 MG tablet TAKE 2 TABLETS BY MOUTH DAILY 60 tablet 11   Current Facility-Administered Medications  Medication Dose Route Frequency Provider Last Rate Last Admin   0.9 %  sodium chloride infusion  500 mL Intravenous Once Ladene Artist, MD        Allergies as of 04/18/2022 - Review Complete 04/18/2022  Allergen Reaction Noted   Chantix [varenicline tartrate]  03/18/2018   Metformin and related  03/18/2018   Neo-bacit-poly-lidocaine Dermatitis, Itching, Rash, and Swelling 04/06/2018   Penicillins Rash 08/17/2016   Tape Dermatitis and Rash 03/18/2018   Tetracyclines & related Rash 08/17/2016   Wellbutrin [bupropion] Rash 08/17/2016    Family History  Problem Relation Age of Onset   Lung cancer Mother    Esophageal cancer Father    Stroke Maternal Grandmother    Heart disease Maternal Grandmother    Colon cancer Neg Hx    Rectal cancer Neg Hx    Stomach cancer Neg Hx    Colon polyps Neg Hx     Social History    Socioeconomic History   Marital status: Married    Spouse name: [name]   Number of children: 0   Years of education: bachelors   Highest education level: Not on file  Occupational History   Not on file  Tobacco Use   Smoking status: Every Day    Packs/day: 0.50    Years: 50.00    Total pack years: 25.00    Types: Cigarettes   Smokeless tobacco: Never   Tobacco comments:    up to 1.5 ppd for a period of time  Vaping Use   Vaping Use: Never used  Substance and Sexual Activity   Alcohol use: Yes    Comment: wine a few times a week   Drug use: Never   Sexual activity: Not Currently    Birth control/protection: Surgical  Other Topics Concern   Not on file  Social History Narrative   11/12/20   From: [place] area originally, moved to [facility] 2013 to be near brother   Living: with husband, Gershon Mussel (2018) and Designer, television/film set   Work: part-time for [employer]      Family: 2 step children - ok relationship and step grandchildren, brother in [place]      Enjoys: golf, walking      Exercise: walking the dog   Diet: diabetic diet - tries to avoid carbs and sugar      Safety   Seat belts: Yes    Guns: No   Safe in relationships: Yes    Social Determinants of Health   Financial Resource Strain: Low Risk  (11/04/2021)   Overall Financial Resource Strain (CARDIA)    Difficulty of Paying Living Expenses: Not hard at all  Food Insecurity: No Food Insecurity (11/04/2021)   Hunger Vital Sign    Worried About Running Out of Food in the Last Year: Never true    [place] in the Last Year: Never true  Transportation Needs: No Transportation Needs (11/04/2021)   PRAPARE - Hydrologist (Medical): No    Lack of Transportation (Non-Medical): No  Physical Activity: Insufficiently Active (11/04/2021)   Exercise Vital Sign    Days of Exercise per Week: 7 days    Minutes of Exercise per Session: 20 min  Stress: No Stress Concern Present (11/04/2021)   [place]    Feeling of Stress : Not at all  Social Connections: Moderately Isolated (11/04/2021)   Social Connection and Isolation Panel [NHANES]    Frequency of Communication with Friends and Family: More than three times a week    Frequency of Social Gatherings with Friends and Family: More than three times a week    Attends Religious Services: Never    Marine scientist or Organizations: No    Attends Archivist Meetings: Never    Marital Status: Married  Human resources officer Violence: Not At Risk (11/04/2021)   Humiliation, Afraid, Rape, and Kick questionnaire    Fear of Current or Ex-Partner: No    Emotionally Abused: No    Physically Abused: No    Sexually Abused: No    Review of Systems:  All systems reviewed were negative except where noted in HPI.   Physical Exam: General:  Alert, well-developed, in NAD Head:  Normocephalic and atraumatic. Eyes:  Sclera clear, no icterus.   Conjunctiva pink. Ears:  Normal auditory acuity. Mouth:  No deformity or lesions.  Neck:  Supple; no masses . Lungs:  Clear throughout to auscultation.   No wheezes, crackles, or rhonchi. No acute distress. Heart:  Regular rate and rhythm; no murmurs. Abdomen:  Soft, nondistended, nontender. No masses, hepatomegaly. No obvious masses.  Normal bowel .    Rectal:  Deferred   Msk:  Symmetrical without gross deformities.. Pulses:  Normal pulses noted. Extremities:  Without edema. Neurologic:  Alert and  oriented x4;  grossly normal neurologically. Skin:  Intact without significant lesions or rashes. Cervical Nodes:  No significant cervical adenopathy. Psych:  Alert and cooperative.  Normal mood and affect.   Impression / Plan:   Personal history of multiple adenomatous colon polyps on colonoscopy in 2020 for surveillance colonoscopy.  Pricilla Riffle. Fuller Plan  04/18/2022, 8:54 AM See Shea Evans, [facility], to contact our on call  provider

## 2022-04-18 NOTE — Op Note (Signed)
[place] Patient Name: Kimberly Hartman Procedure Date: 04/18/2022 8:49 AM MRN: [id number] Endoscopist: Ladene Artist , MD Age: 71 Referring MD:  Date of Birth: 12/14/1950 Gender: Female Account #: [id number] Procedure:                Colonoscopy Indications:              Surveillance: Personal history of adenomatous                            polyps on last colonoscopy 3 years ago Medicines:                Monitored Anesthesia Care Procedure:                Pre-Anesthesia Assessment:                           - Prior to the procedure, a History and Physical                            was performed, and patient medications and                            allergies were reviewed. The patient's tolerance of                            previous anesthesia was also reviewed. The risks                            and benefits of the procedure and the sedation                            options and risks were discussed with the patient.                            All questions were answered, and informed consent                            was obtained. Prior Anticoagulants: The patient has                            taken no previous anticoagulant or antiplatelet                            agents. ASA Grade Assessment: III - A patient with                            severe systemic disease. After reviewing the risks                            and benefits, the patient was deemed in                            satisfactory condition to undergo the procedure.  After obtaining informed consent, the colonoscope                            was passed under direct vision. Throughout the                            procedure, the patient's blood pressure, pulse, and                            oxygen saturations were monitored continuously. The                            Olympus CF-HQ190L [id number]) Colonoscope was                            introduced through the anus  and advanced to the the                            cecum, identified by appendiceal orifice and                            ileocecal valve. The ileocecal valve, appendiceal                            orifice, and rectum were photographed. The quality                            of the bowel preparation was good. The colonoscopy                            was performed without difficulty. The patient                            tolerated the procedure well. Scope In: 9:04:47 AM Scope Out: 9:21:21 AM Scope Withdrawal Time: 0 hours 12 minutes 51 seconds  Total Procedure Duration: 0 hours 16 minutes 34 seconds  Findings:                 The perianal and digital rectal examinations were                            normal.                           Five sessile polyps were found in the sigmoid colon                            (3) and transverse colon (2). The polyps were 6 to                            8 mm in size. These polyps were removed with a cold                            snare. Resection and retrieval were complete.  A few small-mouthed diverticula were found in the                            transverse colon.                           Multiple medium-mouthed diverticula were found in                            the left colon. There was narrowing of the colon in                            association with the diverticular opening. There                            was evidence of diverticular spasm.                            Peri-diverticular erythema was seen. There was no                            evidence of diverticular bleeding.                           The exam was otherwise without abnormality on                            direct and retroflexion views. Complications:            No immediate complications. Estimated blood loss:                            None. Estimated Blood Loss:     Estimated blood loss: none. Impression:               - Five 6 to 8  mm polyps in the sigmoid colon and in                            the transverse colon, removed with a cold snare.                            Resected and retrieved.                           - Mild diverticulosis in the transverse colon.                           - Moderate diverticulosis in the left colon.                           - The examination was otherwise normal on direct                            and retroflexion views. Recommendation:           - Repeat colonoscopy after studies are complete for  surveillance based on pathology results.                           - Patient has a contact number available for                            emergencies. The signs and symptoms of potential                            delayed complications were discussed with the                            patient. Return to normal activities tomorrow.                            Written discharge instructions were provided to the                            patient.                           - High fiber diet.                           - Kegel exercises 5 times a day long term.                           - Continue present medications.                           - Await pathology results. Ladene Artist, MD 04/18/2022 9:24:59 AM This report has been signed electronically.

## 2022-04-18 NOTE — Progress Notes (Signed)
Pt's states no medical or surgical changes since previsit or office visit. 

## 2022-04-19 ENCOUNTER — Telehealth: Payer: Self-pay

## 2022-04-19 NOTE — Telephone Encounter (Signed)
  Follow up Call-     04/18/2022    7:58 AM  Call back number  Post procedure Call Back phone  # [phone]  Permission to leave phone message Yes     Patient questions:  Do you have a fever, pain , or abdominal swelling? No. Pain Score  0 *  Have you tolerated food without any problems? Yes.    Have you been able to return to your normal activities? Yes.    Do you have any questions about your discharge instructions: Diet   No. Medications  No. Follow up visit  No.  Do you have questions or concerns about your Care? No.  Actions: * If pain score is 4 or above: No action needed, pain <4.

## 2022-05-03 ENCOUNTER — Encounter: Payer: Self-pay | Admitting: Gastroenterology

## 2022-05-09 ENCOUNTER — Telehealth: Payer: Self-pay

## 2022-05-09 NOTE — Telephone Encounter (Signed)
That is fine to schedule that way for the patient.

## 2022-05-09 NOTE — Telephone Encounter (Signed)
Please call patient to set up TOC appt

## 2022-05-09 NOTE — Telephone Encounter (Signed)
Patient has been scheduled. I scheduled it as an OV because patient wanted to get in with Dr. Damita Dunnings before October and his next available wasn't until December. Is this okay?

## 2022-05-09 NOTE — Telephone Encounter (Signed)
-----   Message from Tonia Ghent, MD sent at 05/06/2022  3:40 PM EDT ----- Please call this patient.  I see her husband.  My understanding is that she wanted to establish with me since her PCP is leaving.  Her birthday is December 29, 1950.  Her phone number is [phone].  Thanks.  Please schedule when possible/needed.  Brigitte Pulse

## 2022-05-25 ENCOUNTER — Ambulatory Visit (INDEPENDENT_AMBULATORY_CARE_PROVIDER_SITE_OTHER): Payer: Medicare Other | Admitting: Dermatology

## 2022-05-25 DIAGNOSIS — L814 Other melanin hyperpigmentation: Secondary | ICD-10-CM

## 2022-05-25 DIAGNOSIS — L409 Psoriasis, unspecified: Secondary | ICD-10-CM | POA: Diagnosis not present

## 2022-05-25 DIAGNOSIS — L578 Other skin changes due to chronic exposure to nonionizing radiation: Secondary | ICD-10-CM | POA: Diagnosis not present

## 2022-05-25 DIAGNOSIS — M6530 Trigger finger, unspecified finger: Secondary | ICD-10-CM

## 2022-05-25 DIAGNOSIS — L821 Other seborrheic keratosis: Secondary | ICD-10-CM | POA: Diagnosis not present

## 2022-05-25 DIAGNOSIS — D1800 Hemangioma unspecified site: Secondary | ICD-10-CM

## 2022-05-25 DIAGNOSIS — D229 Melanocytic nevi, unspecified: Secondary | ICD-10-CM

## 2022-05-25 DIAGNOSIS — Z1283 Encounter for screening for malignant neoplasm of skin: Secondary | ICD-10-CM | POA: Diagnosis not present

## 2022-05-25 MED ORDER — SKYRIZI PEN 150 MG/ML ~~LOC~~ SOAJ: 150.0000 mg | SUBCUTANEOUS | 1 refills | Status: DC

## 2022-05-25 NOTE — Patient Instructions (Signed)
Recommend taking Heliocare sun protection supplement daily in sunny weather for additional sun protection. For maximum protection on the sunniest days, you can take up to 2 capsules of regular Heliocare OR take 1 capsule of Heliocare Ultra. For prolonged exposure (such as a full day in the sun), you can repeat your dose of the supplement 4 hours after your first dose. Heliocare can be purchased at [employer], at some [facility] or at [email or web address].    Reviewed risks of biologics including immunosuppression, infections, injection site reaction, and failure to improve condition. Goal is control of skin condition, not cure.  Some older biologics such as Humira and Enbrel may slightly increase risk of malignancy and may worsen congestive heart failure.  Talz and Cosentyx may cause inflammatory bowel disease to flare. The use of biologics requires long term medication management, including periodic office visits and monitoring of blood work.  Melanoma ABCDEs  Melanoma is the most dangerous type of skin cancer, and is the leading cause of death from skin disease.  You are more likely to develop melanoma if you: Have light-colored skin, light-colored eyes, or red or blond hair Spend a lot of time in the sun Tan regularly, either outdoors or in a tanning bed Have had blistering sunburns, especially during childhood Have a close family member who has had a melanoma Have atypical moles or large birthmarks  Early detection of melanoma is key since treatment is typically straightforward and cure rates are extremely high if we catch it early.   The first sign of melanoma is often a change in a mole or a new dark spot.  The ABCDE system is a way of remembering the signs of melanoma.  A for asymmetry:  The two halves do not match. B for border:  The edges of the growth are irregular. C for color:  A mixture of colors are present instead of an even brown color. D for diameter:  Melanomas are  usually (but not always) greater than 52m - the size of a pencil eraser. E for evolution:  The spot keeps changing in size, shape, and color.  Please check your skin once per month between visits. You can use a small mirror in front and a large mirror behind you to keep an eye on the back side or your body.   If you see any new or changing lesions before your next follow-up, please call to schedule a visit.  Please continue daily skin protection including broad spectrum sunscreen SPF 30+ to sun-exposed areas, reapplying every 2 hours as needed when you're outdoors.    Due to recent changes in healthcare laws, you may see results of your pathology and/or laboratory studies on MyChart before the doctors have had a chance to review them. We understand that in some cases there may be results that are confusing or concerning to you. Please understand that not all results are received at the same time and often the doctors may need to interpret multiple results in order to provide you with the best plan of care or course of treatment. Therefore, we ask that you please give u[place]2 business days to thoroughly review all your results before contacting the office for clarification. Should we see a critical lab result, you will be contacted sooner.   If You Need Anything After Your Visit  If you have any questions or concerns for your doctor, please call our main line at [phone] press option 4 to reach your doctor's medical assistant.  If no one answers, please leave a voicemail as directed and we will return your call as soon as possible. Messages left after 4 pm will be answered the following business day.   You may also send [place] a message via [place]. We typically respond to MyChart messages within 1-2 business days.  For prescription refills, please ask your pharmacy to contact our office. Our fax number is [phone].  If you have an urgent issue when the clinic is closed that cannot wait until  the next business day, you can page your doctor at the number below.    Please note that while we do our best to be available for urgent issues outside of office hours, we are not available 24/7.   If you have an urgent issue and are unable to reach [place], you may choose to seek medical care at your doctor's office, retail clinic, urgent care center, or emergency room.  If you have a medical emergency, please immediately call 911 or go to the emergency department.  Pager Numbers  - Dr. Nehemiah Massed: [phone]  - Dr. Laurence Ferrari: [phone]  - Dr. Nicole Kindred: [phone]  In the event of inclement weather, please call our main line at [phone] for an update on the status of any delays or closures.  Dermatology Medication Tips: Please keep the boxes that topical medications come in in order to help keep track of the instructions about where and how to use these. Pharmacies typically print the medication instructions only on the boxes and not directly on the medication tubes.   If your medication is too expensive, please contact our office at [phone] option 4 or send [place] a message through [place].   We are unable to tell what your co-pay for medications will be in advance as this is different depending on your insurance coverage. However, we may be able to find a substitute medication at lower cost or fill out paperwork to get insurance to cover a needed medication.   If a prior authorization is required to get your medication covered by your insurance company, please allow [place] 1-2 business days to complete this process.  Drug prices often vary depending on where the prescription is filled and some pharmacies may offer cheaper prices.  The website www.goodrx.com contains coupons for medications through different pharmacies. The prices here do not account for what the cost may be with help from insurance (it may be cheaper with your insurance), but the website can give you the price if you did  not use any insurance.  - You can print the associated coupon and take it with your prescription to the pharmacy.  - You may also stop by our office during regular business hours and pick up a GoodRx coupon card.  - If you need your prescription sent electronically to a different pharmacy, notify our office through [facility] or by phone at [phone] option 4.     Si Usted Necesita Algo Despus de Su Visita  Tambin puede enviarnos un mensaje a travs de Pharmacist, community. Por lo general respondemos a los mensajes de MyChart en el transcurso de 1 a 2 das hbiles.  Para renovar recetas, por favor pida a su farmacia que se ponga en contacto con nuestra oficina. Harland Dingwall de fax es [employer] [phone].  Si tiene un asunto urgente cuando la clnica est cerrada y que no puede esperar hasta el siguiente da hbil, puede llamar/localizar a su doctor(a) al nmero que aparece a continuacin.   Por favor, tenga  en cuenta que aunque hacemos todo lo posible para estar disponibles para asuntos urgentes fuera del horario de [place], no estamos disponibles las 24 horas del da, los 7 das de la [employer].   Si tiene un problema urgente y no puede comunicarse con nosotros, puede optar por buscar atencin mdica  en el consultorio de su doctor(a), en una clnica privada, en un centro de atencin urgente o en una sala de emergencias.  Si tiene Engineering geologist, por favor llame inmediatamente al 911 o vaya a la sala de emergencias.  Nmeros de bper  - Dr. Nehemiah Massed: [phone]  - Dra. Moye: [phone]  - Dra. Nicole Kindred: [phone]  En caso de inclemencias del [place], por favor llame a Johnsie Kindred principal al [phone] para una actualizacin sobre el [place] de cualquier retraso o cierre.  Consejos para la medicacin en dermatologa: Por favor, guarde las cajas en las que vienen los medicamentos de uso tpico para ayudarle a seguir las instrucciones sobre dnde y cmo usarlos. Las  farmacias generalmente imprimen las instrucciones del medicamento slo en las cajas y no directamente en los tubos del [place].   Si su medicamento es muy caro, por favor, pngase en contacto con Zigmund Daniel llamando al [phone] y presione la opcin 4 o envenos un mensaje a travs de Pharmacist, community.   No podemos decirle cul ser su copago por los medicamentos por adelantado ya que esto es diferente dependiendo de la cobertura de su seguro. Sin embargo, es posible que podamos encontrar un medicamento sustituto a Electrical engineer un formulario para que el seguro cubra el medicamento que se considera necesario.   Si se requiere una autorizacin previa para que su compaa de seguros [place] su medicamento, por favor permtanos de 1 a 2 das hbiles para completar este proceso.  Los precios de los medicamentos varan con frecuencia dependiendo del Environmental consultant de dnde se surte la receta y alguna farmacias pueden ofrecer precios ms baratos.  El sitio web www.goodrx.com tiene cupones para medicamentos de Airline pilot. Los precios aqu no tienen en cuenta lo que podra costar con la ayuda del seguro (puede ser ms barato con su seguro), pero el sitio web puede darle el precio si no utiliz Research scientist (physical sciences).  - Puede imprimir el cupn correspondiente y llevarlo con su receta a la farmacia.  - Tambin puede pasar por nuestra oficina durante el horario de atencin regular y Charity fundraiser una tarjeta de cupones de GoodRx.  - Si necesita que su receta se enve electrnicamente a una farmacia diferente, informe a nuestra oficina a travs de MyChart de [place] o por telfono llamando al [phone] y presione la opcin 4.

## 2022-05-25 NOTE — Progress Notes (Signed)
Follow-Up Visit   Subjective  Kimberly Hartman is a 71 y.o. female who presents for the following: Psoriasis (Patient on Cosentyx. She does use calcipotriene with flares and occasionally betamethasone. Patient with a small active area at left foot.) and FBSE (The patient presents for Total-Body Skin Exam (TBSE) for skin cancer screening and mole check.  The patient has spots, moles and lesions to be evaluated, some may be new or changing and the patient has concerns that these could be cancer. Patient with no hx of skin cancer. ).  Patient with a new, rough spot at right buttock.   The following portions of the chart were reviewed this encounter and updated as appropriate:   Tobacco  Allergies  Meds  Problems  Med Hx  Surg Hx  Fam Hx      Review of Systems:  No other skin or systemic complaints except as noted in HPI or Assessment and Plan.  Objective  Well appearing patient in no apparent distress; mood and affect are within normal limits.  A full examination was performed including scalp, head, eyes, ears, nose, lips, neck, chest, axillae, abdomen, back, buttocks, bilateral upper extremities, bilateral lower extremities, hands, feet, fingers, toes, fingernails, and toenails. All findings within normal limits unless otherwise noted below.  back Thin scaly red plaques at back Scaly pink plaques at feet    Assessment & Plan  Psoriasis back  Chronic and persistent condition with duration or expected duration over one year. Condition is symptomatic/ bothersome to patient. Not currently at goal.  Psoriasis is a chronic non-curable, but treatable genetic/hereditary disease that may have other systemic features affecting other organ systems such as joints (Psoriatic Arthritis). It is associated with an increased risk of inflammatory bowel disease, heart disease, non-alcoholic fatty liver disease, and depression.    Continue Cosentyx 150 mg  Samples given x 2 to patient Lot # [id number]   Exp: 04/2023  Will submit for Skyrizi and d/c cosentyx once [employer] approved     Risankizumab-rzaa (SKYRIZI PEN) 150 MG/ML SOAJ - back Inject 150 mg into the skin as directed. At weeks 0 & 4.  Risankizumab-rzaa (SKYRIZI PEN) 150 MG/ML SOAJ - back Inject 150 mg into the skin as directed. Every 12 weeks for maintenance.  Trigger finger of left hand, unspecified finger Left Hand - Anterior  Recommend patient schedule follow up with rheumatology for evaluation of bilateral trigger finger   Lentigines - Scattered tan macules - Due to sun exposure - Benign-appearing, observe - Recommend daily broad spectrum sunscreen SPF 30+ to sun-exposed areas, reapply every 2 hours as needed. - Call for any changes  Seborrheic Keratoses - Stuck-on, waxy, tan-brown papules and/or plaques  - Benign-appearing - Discussed benign etiology and prognosis. - Observe - Call for any changes  Melanocytic Nevi - Tan-brown and/or pink-flesh-colored symmetric macules and papules - Benign appearing on exam today - Observation - Call clinic for new or changing moles - Recommend daily use of broad spectrum spf 30+ sunscreen to sun-exposed areas.   Hemangiomas - Red papules - Discussed benign nature - Observe - Call for any changes  Actinic Damage - Chronic condition, secondary to cumulative UV/sun exposure - diffuse scaly erythematous macules with underlying dyspigmentation - Recommend daily broad spectrum sunscreen SPF 30+ to sun-exposed areas, reapply every 2 hours as needed.  - Staying in the shade or wearing long sleeves, sun glasses (UVA+UVB protection) and wide brim hats (4-inch brim around the entire circumference of the hat) are also recommended for  sun protection.  - Call for new or changing lesions.  Skin cancer screening performed today.  Return in about 3 months (around 08/25/2022) for Psoriasis.  Graciella Belton, RMA, am acting as scribe for Forest Gleason, MD .  Documentation: I  have reviewed the above documentation for accuracy and completeness, and I agree with the above.  Forest Gleason, MD

## 2022-05-30 ENCOUNTER — Encounter: Payer: Self-pay | Admitting: Dermatology

## 2022-06-02 ENCOUNTER — Encounter: Payer: Self-pay | Admitting: Family Medicine

## 2022-06-02 DIAGNOSIS — E119 Type 2 diabetes mellitus without complications: Secondary | ICD-10-CM | POA: Diagnosis not present

## 2022-06-02 DIAGNOSIS — Z961 Presence of intraocular lens: Secondary | ICD-10-CM | POA: Diagnosis not present

## 2022-06-02 DIAGNOSIS — H40013 Open angle with borderline findings, low risk, bilateral: Secondary | ICD-10-CM | POA: Diagnosis not present

## 2022-06-02 DIAGNOSIS — H52203 Unspecified astigmatism, bilateral: Secondary | ICD-10-CM | POA: Diagnosis not present

## 2022-06-02 LAB — HM DIABETES EYE EXAM

## 2022-06-30 ENCOUNTER — Ambulatory Visit (INDEPENDENT_AMBULATORY_CARE_PROVIDER_SITE_OTHER): Payer: Medicare Other | Admitting: Family Medicine

## 2022-06-30 ENCOUNTER — Encounter: Payer: Self-pay | Admitting: Family Medicine

## 2022-06-30 VITALS — BP 122/60 | HR 69 | Temp 97.2°F | Ht 63.0 in | Wt 176.0 lb

## 2022-06-30 DIAGNOSIS — R0683 Snoring: Secondary | ICD-10-CM

## 2022-06-30 DIAGNOSIS — E118 Type 2 diabetes mellitus with unspecified complications: Secondary | ICD-10-CM | POA: Diagnosis not present

## 2022-06-30 DIAGNOSIS — Z7189 Other specified counseling: Secondary | ICD-10-CM | POA: Diagnosis not present

## 2022-06-30 DIAGNOSIS — E039 Hypothyroidism, unspecified: Secondary | ICD-10-CM | POA: Diagnosis not present

## 2022-06-30 DIAGNOSIS — M6530 Trigger finger, unspecified finger: Secondary | ICD-10-CM

## 2022-06-30 DIAGNOSIS — F17200 Nicotine dependence, unspecified, uncomplicated: Secondary | ICD-10-CM | POA: Diagnosis not present

## 2022-06-30 NOTE — Progress Notes (Signed)
RSV vaccine d/w pt.  Okay to defer for now, she can get a flu shot in the meantime.  Discussed.  Fatigue.  Had been down to '50mg'$  of sertraline, prev on '100mg'$ .  Change in appetite.  She doesn't have true early satiety.  Mood d/w pt.  "Most of the time I'm pretty good, with some anxiety."  She snores, husband notes it.   Joint pain.  Seeing rheumatology. She is on cosentyx but not yet on skyrizi.  She has had triggering of L 2nd and 3rd fingers. D/w pt about options.    Smoking d/w pt.  She wasn't at the point of wanting to quit.  Had annual lung screening.  Discussed.  Diabetes:  Using medications without difficulties: yes Hypoglycemic episodes:no Hyperglycemic episodes:no Feet problems: no Blood Sugars averaging: 141 this AM.   eye exam within last year: yes  Hypothyroidism.  Compliant.  Most recent TSH wnl.    Husband Gershon Mussel designated if patient were incapacitated.    Meds, vitals, and allergies reviewed.   ROS: Per HPI unless specifically indicated in ROS section   GEN: nad, alert and oriented HEENT: ncat NECK: supple w/o LA CV: rrr PULM: ctab, no inc wob ABD: soft, +bs EXT: no edema SKIN: no acute rash Left second and third fingers trigger 14.5" neck circumference.    30 minutes were devoted to patient care in this encounter (this includes time spent reviewing the patient's file/history, interviewing and examining the patient, counseling/reviewing plan with patient).

## 2022-06-30 NOTE — Patient Instructions (Signed)
Check with ortho and let me know if you need a referral.   Take care.  Glad to see you. We'll call about seeing pulmonary.

## 2022-07-01 DIAGNOSIS — F17200 Nicotine dependence, unspecified, uncomplicated: Secondary | ICD-10-CM | POA: Insufficient documentation

## 2022-07-01 DIAGNOSIS — M6530 Trigger finger, unspecified finger: Secondary | ICD-10-CM | POA: Insufficient documentation

## 2022-07-01 DIAGNOSIS — R0683 Snoring: Secondary | ICD-10-CM | POA: Insufficient documentation

## 2022-07-01 DIAGNOSIS — Z7189 Other specified counseling: Secondary | ICD-10-CM | POA: Insufficient documentation

## 2022-07-01 NOTE — Assessment & Plan Note (Signed)
Unclear if she has sleep apnea.  She snores and she has fatigue.  If she does have sleep apnea could greatly affect a lot of other issues such as her energy level, her sugar, etc.  I think it makes sense to see pulmonary for consideration of a sleep study and she agrees.  Referral placed.  Rationale discussed with patient.

## 2022-07-01 NOTE — Assessment & Plan Note (Signed)
Continue Actos and Januvia.  Continue work on diet and exercise.

## 2022-07-01 NOTE — Assessment & Plan Note (Signed)
Discussed orthopedic follow-up and she will let me know if she needs referral.  She can call for an appointment.

## 2022-07-01 NOTE — Assessment & Plan Note (Signed)
Smoking d/w pt.  She wasn't at the point of wanting to quit.  Had annual lung screening.  Discussed.

## 2022-07-01 NOTE — Assessment & Plan Note (Signed)
Husband [name] designated if patient were incapacitated.    

## 2022-07-01 NOTE — Assessment & Plan Note (Signed)
Compliant.  Most recent TSH wnl.  Continue levothyroxine as is.

## 2022-07-06 ENCOUNTER — Ambulatory Visit
Admission: RE | Admit: 2022-07-06 | Discharge: 2022-07-06 | Disposition: A | Discharge status: Home / Self Care | Payer: Medicare Other | Source: Ambulatory Visit | Attending: Family Medicine | Admitting: Family Medicine

## 2022-07-06 DIAGNOSIS — M85851 Other specified disorders of bone density and structure, right thigh: Secondary | ICD-10-CM | POA: Diagnosis not present

## 2022-07-06 DIAGNOSIS — Z780 Asymptomatic menopausal state: Secondary | ICD-10-CM | POA: Diagnosis not present

## 2022-07-11 ENCOUNTER — Telehealth: Payer: Self-pay

## 2022-07-11 NOTE — Telephone Encounter (Signed)
Left voicemail for patient to return my call. Approved for [employer] Patient Assistance for Orson Ape and was going to go over next steps. aw

## 2022-07-11 NOTE — Telephone Encounter (Signed)
Patient advised of information per patient assistance. She did take a cosentyx injection last week due to [facility] application not being completed yet.  Patient will schedule delivery to the office. Advised patient once in hand, we can confirm with Dr. Laurence Ferrari, but we could potentially start her 08/03/22 to line up with her psoriasis follow up and be due for her second loading dose on the 11/29 appointment. aw

## 2022-07-13 DIAGNOSIS — H53461 Homonymous bilateral field defects, right side: Secondary | ICD-10-CM | POA: Diagnosis not present

## 2022-07-13 DIAGNOSIS — H53462 Homonymous bilateral field defects, left side: Secondary | ICD-10-CM | POA: Diagnosis not present

## 2022-07-14 ENCOUNTER — Other Ambulatory Visit: Payer: Self-pay | Admitting: Ophthalmology

## 2022-07-14 DIAGNOSIS — E039 Hypothyroidism, unspecified: Secondary | ICD-10-CM | POA: Diagnosis not present

## 2022-07-14 DIAGNOSIS — H53469 Homonymous bilateral field defects, unspecified side: Secondary | ICD-10-CM

## 2022-07-14 DIAGNOSIS — E1169 Type 2 diabetes mellitus with other specified complication: Secondary | ICD-10-CM | POA: Diagnosis not present

## 2022-07-14 DIAGNOSIS — E785 Hyperlipidemia, unspecified: Secondary | ICD-10-CM | POA: Diagnosis not present

## 2022-07-21 ENCOUNTER — Ambulatory Visit (INDEPENDENT_AMBULATORY_CARE_PROVIDER_SITE_OTHER): Payer: Medicare Other | Admitting: Adult Health

## 2022-07-21 ENCOUNTER — Encounter: Payer: Self-pay | Admitting: Adult Health

## 2022-07-21 DIAGNOSIS — R0683 Snoring: Secondary | ICD-10-CM

## 2022-07-21 DIAGNOSIS — E669 Obesity, unspecified: Secondary | ICD-10-CM | POA: Diagnosis not present

## 2022-07-21 NOTE — Assessment & Plan Note (Signed)
Loud snoring, restless sleep, daytime sleepiness all concerning for underlying sleep apnea.  We will set patient up for home sleep study.  Patient education was given.  - discussed how weight can impact sleep and risk for sleep disordered breathing - discussed options to assist with weight loss: combination of diet modification, cardiovascular and strength training exercises   - had an extensive discussion regarding the adverse health consequences related to untreated sleep disordered breathing - specifically discussed the risks for hypertension, coronary artery disease, cardiac dysrhythmias, cerebrovascular disease, and diabetes - lifestyle modification discussed   - discussed how sleep disruption can increase risk of accidents, particularly when driving - safe driving practices were discussed   Plan PLAN:  Patient Instructions  Set up for home sleep study Work on healthy weight loss Do not drive if sleepy Healthy sleep regimen Follow-up in 2 months to discuss sleep study results and treatment plan

## 2022-07-21 NOTE — Addendum Note (Signed)
Addended by: Irine Seal B on: 07/21/2022 04:07 PM   Modules accepted: Orders

## 2022-07-21 NOTE — Patient Instructions (Signed)
Set up for home sleep study Work on healthy weight loss Do not drive if sleepy Healthy sleep regimen Follow-up in 2 months to discuss sleep study results and treatment plan

## 2022-07-21 NOTE — Assessment & Plan Note (Signed)
Healthy weight loss discussed 

## 2022-07-21 NOTE — Progress Notes (Signed)
$'@Patient'Kimberly Hartman$  ID: [name], female    DOB: 1951-05-11, 71 Kimberly Hartman.o.   MRN: [id number]  Chief Complaint  Patient presents with   Consult    Referring provider: Tonia Ghent, MD  HPI: 71 year old female presents for sleep consult July 21, 2022 for snoring, restless sleep, daytime sleepiness Medical history significant for diabetes, psoriasis  TEST/EVENTS :   07/21/2022 Sleep consult  Patient presents for a sleep consult today.  Kindly referred by her primary care provider Dr. Damita Dunnings.  Patient complains of snoring, restless sleep, daytime sleepiness.  Sleep is fragmented. Tosses and turns a lot. No energy. Wakes up feeling exhausted. Loud snoring per spouse. Patient says she feels tired throughout the day.  Has trouble going to sleep at times.  Typically goes to bed about 10 PM.  Can take anywhere between 15 minutes and 4 hours to go to sleep.  Is up a couple times throughout the night.  Gets up at 6:30 AM.  Patient's weight is up 10 pounds over the last 2 years.  Current weight is at 175 pounds with a current BMI at 31.  No previous sleep study.  No symptoms concerning for cataplexy or sleep paralysis.  Caffeine intake 3 cup of coffee.  No history congestive heart failure or stroke.  Epworth score is 7 out of 24.  Typically gets sleepy if she sits down to watch TV and in the afternoon hours. No removable dental work  Takes Nyquil to help with sleep.  Takes at least 1 nap daily  Social history.  Patient is married.  Works part-time in [employer] (medical).  She has no children.  Smokes half a pack of cigarettes daily.  Drinks 1 to 2 glasses of alcohol weekly.  Family history positive for emphysema and lung cancer.OSA     Allergies  Allergen Reactions   Chantix [Varenicline Tartrate]    Glimepiride     hypoglycemia   Metformin And Related    Neo-Bacit-Poly-Lidocaine Itching, Swelling, Dermatitis and Rash    She can tolerate bacitracin.     Penicillins Rash    TOLERATED KEFLEX WITH NO ISSUES    Tape Dermatitis and Rash    THE ADHESIVE PART NOT THE ACTUAL TAPE   Tetracyclines & Related Rash   Wellbutrin [Bupropion] Rash    Immunization History  Administered Date(s) Administered   Fluad Quad(high Dose 65+) 08/22/2020   Influenza, High Dose Seasonal PF 07/12/2019   Influenza,inj,Quad PF,6+ Mos 09/13/2018   Influenza-Unspecified 09/13/2018, 07/07/2021   PFIZER(Purple Top)SARS-COV-2 Vaccination 11/09/2019, 11/30/2019, 08/22/2020, 01/30/2021   PPD Test 06/17/2018   Pfizer Covid-19 Vaccine Bivalent Booster 83yr & up 09/11/2021   Pneumococcal Conjugate-13 10/19/2015   Pneumococcal Polysaccharide-23 03/27/2013, 11/17/2021   Tdap 09/12/2012   Zoster Recombinat (Shingrix) 10/18/2017, 06/17/2018    Past Medical History:  Diagnosis Date   Allergy    Anxiety    Arthritis    Cataract    removed both , lens implants   [place]    [place]    Psoriasis    Thyroid disease     Tobacco History: Social History   Tobacco Use  Smoking Status Every Day   Packs/day: 0.25   Years: 50.00   Total pack years: 12.50   Types: Cigarettes  Smokeless Tobacco Never  Tobacco Comments   Pt states she smokes at most a half of a pack daily. 07/21/22 LW   Ready to quit: No Counseling given: Yes Tobacco comments: Pt states  she smokes at most a half of a pack daily. 07/21/22 LW   Outpatient Medications Prior to Visit  Medication Sig Dispense Refill   acetaminophen (TYLENOL) 325 MG tablet Take 650 mg by mouth every 6 (six) hours as needed.     atorvastatin (LIPITOR) 40 MG tablet Take 1 tablet (40 mg total) by mouth daily. 90 tablet 2   augmented betamethasone dipropionate (DIPROLENE-AF) 0.05 % ointment      Calcium Carbonate-Vitamin D (CALCIUM-D) 600-400 MG-UNIT TABS Take 2 tablets by mouth daily.     cetirizine (ZYRTEC) 10 MG tablet Take 10 mg by mouth daily as needed for allergies.     Cholecalciferol (VITAMIN  D3) 1000 units CAPS Take 1 capsule by mouth daily.     diphenhydrAMINE (BENADRYL) 25 MG tablet Take 25 mg by mouth daily as needed.     fluticasone (FLONASE) 50 MCG/ACT nasal spray Place into both nostrils daily as needed for allergies or rhinitis.     glucose blood (ONE TOUCH ULTRA TEST) test strip Use to check blood sugar once a day. 50 each 3   hydrocortisone 2.5 % cream Apply topically 2 (two) times daily. Apply to rash between the breast BID prn x up to 2 weeks. 28 g 0   Ibuprofen 200 MG CAPS Take 2 tablets by mouth daily as needed.     JANUVIA 100 MG tablet TAKE 1 TABLET BY MOUTH  DAILY 90 tablet 3   levothyroxine (SYNTHROID) 100 MCG tablet TAKE 1 TABLET BY MOUTH IN THE  MORNING 90 tablet 3   Multiple Vitamins-Minerals (MULTIVITAMIN ADULTS 50+) TABS Take 1 tablet by mouth daily.     pioglitazone (ACTOS) 45 MG tablet Take 45 mg by mouth daily.     Secukinumab (COSENTYX SENSOREADY PEN [place]) 1 injection ('300mg'$ ) every 4 weeks.     sertraline (ZOLOFT) 50 MG tablet TAKE 2 TABLETS BY MOUTH DAILY 60 tablet 11   Risankizumab-rzaa (SKYRIZI PEN) 150 MG/ML SOAJ Inject 150 mg into the skin as directed. At weeks 0 & 4. (Patient not taking: Reported on 06/30/2022) 1 mL 1   Risankizumab-rzaa (SKYRIZI PEN) 150 MG/ML SOAJ Inject 150 mg into the skin as directed. Every 12 weeks for maintenance. (Patient not taking: Reported on 06/30/2022) 1 mL 1   No facility-administered medications prior to visit.     Review of Systems:   Constitutional:   No  weight loss, night sweats,  Fevers, chills,  +fatigue, or  lassitude.  HEENT:   No headaches,  Difficulty swallowing,  Tooth/dental problems, or  Sore throat,                No sneezing, itching, ear ache, nasal congestion, post nasal drip,   CV:  No chest pain,  Orthopnea, PND, swelling in lower extremities, anasarca, dizziness, palpitations, syncope.   GI  No heartburn, indigestion, abdominal pain, nausea, vomiting, diarrhea, change in bowel habits, loss of  appetite, bloody stools.   Resp: No shortness of breath with exertion or at rest.  No excess mucus, no productive cough,  No non-productive cough,  No coughing up of blood.  No change in color of mucus.  No wheezing.  No chest wall deformity  Skin: no rash or lesions.  GU: no dysuria, change in color of urine, no urgency or frequency.  No flank pain, no hematuria   MS:  No joint pain or swelling.  No decreased range of motion.  No back pain.    Physical Exam  BP 120/66 (BP  Location: Left Arm, Patient Position: Sitting, Cuff Size: Normal)   Pulse 72   Temp 98 F (36.7 C) (Oral)   Ht '5\' 3"'$  (1.6 m)   Wt 175 lb 3.2 oz (79.5 kg)   SpO2 97%   BMI 31.04 kg/m   GEN: A/Ox3; pleasant , NAD, well nourished    HEENT:  [place]/AT,  EACs-clear, TMs-wnl, NOSE-clear, THROAT-clear, no lesions, no postnasal drip or exudate noted. Class 3 MP   NECK:  Supple w/ fair ROM; no JVD; normal carotid impulses w/o bruits; no thyromegaly or nodules palpated; no lymphadenopathy.    RESP  Clear  P & A; w/o, wheezes/ rales/ or rhonchi. no accessory muscle use, no dullness to percussion  CARD:  RRR, no m/r/g, no peripheral edema, pulses intact, no cyanosis or clubbing.  GI:   Soft & nt; nml bowel sounds; no organomegaly or masses detected.   Musco: Warm bil, no deformities or joint swelling noted.   Neuro: alert, no focal deficits noted.    Skin: Warm, no lesions or rashes    Lab Results:  CBC    Component Value Date/Time   WBC 6.5 11/17/2021 0906   RBC 4.81 11/17/2021 0906   HGB 13.3 11/17/2021 0906   HGB 13.5 11/18/2020 1005   HCT 41.6 11/17/2021 0906   HCT 41.0 11/18/2020 1005   PLT 290.0 11/17/2021 0906   PLT 314 11/18/2020 1005   MCV 86.5 11/17/2021 0906   MCV 86 11/18/2020 1005   MCH 28.4 11/18/2020 1005   MCH 28.5 03/19/2018 1317   MCHC 32.1 11/17/2021 0906   RDW 14.4 11/17/2021 0906   RDW 13.1 11/18/2020 1005   LYMPHSABS 1.7 11/17/2021 0906   LYMPHSABS 2.3 11/18/2020 1005    MONOABS 0.5 11/17/2021 0906   EOSABS 0.1 11/17/2021 0906   EOSABS 0.1 11/18/2020 1005   BASOSABS 0.1 11/17/2021 0906   BASOSABS 0.1 11/18/2020 1005    BMET    Component Value Date/Time   NA 138 11/17/2021 0906   NA 140 11/18/2020 1005   K 4.5 11/17/2021 0906   CL 102 11/17/2021 0906   CO2 30 11/17/2021 0906   GLUCOSE 158 (H) 11/17/2021 0906   BUN 20 11/17/2021 0906   BUN 17 11/18/2020 1005   CREATININE 0.71 11/17/2021 0906   CALCIUM 9.5 11/17/2021 0906   GFRNONAA 72 11/18/2020 1005   GFRAA 83 11/18/2020 1005    BNP No results found for: "BNP"  ProBNP No results found for: "PROBNP"  Imaging: DEXAScan  Result Date: 07/06/2022 EXAM: DUAL X-RAY ABSORPTIOMETRY (DXA) FOR BONE MINERAL DENSITY IMPRESSION: Referring Physician:  [place] Your patient completed a bone mineral density test using GE Lunar iDXA system (analysis version: 16). Technologist: sec PATIENT: Name: Uriyah, Massimo Patient ID: [id number] Birth Date: [birth year] Height: 63.0 in. Sex: Female Measured: 07/06/2022 Weight: 173.0 lbs. Indications: Advanced Age, Caucasian, Estrogen Deficient, Hysterectomy, Postmenopausal, Tobacco User (Current Smoker) Fractures: Left Ankle Treatments: Calcium (E943.0), Vitamin D (E933.5) ASSESSMENT: The BMD measured at Femur Neck Right is 0.714 g/cm2 with a T-score of -2.3. This patient is considered osteopenic/low bone mass according to [facility]) criteria. The quality of the exam is good. Site Region Measured Date Measured Age YA BMD Significant CHANGE T-score DualFemur Neck Right 07/06/2022 71.0 -2.3 0.714 g/cm2 * DualFemur Neck Right 01/21/2020 69.6 -1.9 0.780 g/cm2 AP Spine L1-L4 07/06/2022 72.0 -0.7 1.096 g/cm2 AP Spine L1-L4 01/21/2020 69.6 -0.6 1.105 g/cm2 DualFemur Total Mean 07/06/2022 72.0 -1.4 0.832 g/cm2 DualFemur Total Mean 01/21/2020  69.6 -1.3 0.838 g/cm2 Left Forearm Radius 33% 07/06/2022 72.0 -0.9 0.800 g/cm2 World Health Organization [facility]) criteria for  post-menopausal, Caucasian Women: Normal       T-score at or above -1 SD Osteopenia   T-score between -1 and -2.5 SD Osteoporosis T-score at or below -2.5 SD RECOMMENDATION: 1. All patients should optimize calcium and vitamin D intake. 2. Consider FDA-approved medical therapies in postmenopausal women and men aged 35 years and older, based on the following: a. A hip or vertebral (clinical or morphometric) fracture. b. T-score = -2.5 at the femoral neck or spine after appropriate evaluation to exclude secondary causes. c. Low bone mass (T-score between -1.0 and -2.5 at the femoral neck or spine) and a 10-year probability of a hip fracture = 3% or a 10-year probability of a major osteoporosis-related fracture = 20% based on the [place]-adapted WHO algorithm. d. Clinician judgment and/or patient preferences may indicate treatment for people with 10-year fracture probabilities above or below these levels. FOLLOW-UP: Patients with diagnosis of osteoporosis or at high risk for fracture should have regular bone mineral density tests.? Patients eligible for Medicare are allowed routine testing every 2 years.? The testing frequency can be increased to one year for patients who have rapidly progressing disease, are receiving or discontinuing medical therapy to restore bone mass, or have additional risk factors. I have reviewed this study and agree with the findings. Mark A. Thornton Papas, M.D. [facility] FRAX* 10-year Probability of Fracture Based on femoral neck BMD: DualFemur (Right) Major Osteoporotic Fracture: 15.2% Hip Fracture:                5.8% Population:                  [place] (Caucasian) Risk Factors:                Tobacco User (Current Smoker) *FRAX is a Materials engineer of the [employer] of [employer] for Metabolic Bone Disease, a World Pharmacologist (WHO) [facility]. ASSESSMENT: The probability of a major osteoporotic fracture is 15.2 % within the next ten years. The  probability of a hip fracture is 5.8 % within the next ten years. I have reviewed this report and agree with the above findings. Mark A. Thornton Papas, M.D. [facility] Electronically Signed   By: Lavonia Dana M.D.   On: 07/06/2022 12:30          No data to display          No results found for: "NITRICOXIDE"      Assessment & Plan:   Snoring Loud snoring, restless sleep, daytime sleepiness all concerning for underlying sleep apnea.  We will set patient up for home sleep study.  Patient education was given.  - discussed how weight can impact sleep and risk for sleep disordered breathing - discussed options to assist with weight loss: combination of diet modification, cardiovascular and strength training exercises   - had an extensive discussion regarding the adverse health consequences related to untreated sleep disordered breathing - specifically discussed the risks for hypertension, coronary artery disease, cardiac dysrhythmias, cerebrovascular disease, and diabetes - lifestyle modification discussed   - discussed how sleep disruption can increase risk of accidents, particularly when driving - safe driving practices were discussed   Plan PLAN:  Patient Instructions  Set up for home sleep study Work on healthy weight loss Do not drive if sleepy Healthy sleep regimen Follow-up in 2 months to discuss sleep study results and  treatment plan     Obesity (BMI 30-39.9) Healthy weight loss discussed     Rexene Edison, NP 07/21/2022

## 2022-07-22 DIAGNOSIS — M65322 Trigger finger, left index finger: Secondary | ICD-10-CM | POA: Diagnosis not present

## 2022-07-22 DIAGNOSIS — M65332 Trigger finger, left middle finger: Secondary | ICD-10-CM | POA: Diagnosis not present

## 2022-07-22 NOTE — Progress Notes (Signed)
Reviewed and agree with assessment/plan.   Chesley Mires, MD [facility] Pulmonary/Critical Care 07/22/2022, 7:10 AM Pager:  [phone]

## 2022-07-25 ENCOUNTER — Telehealth: Payer: Self-pay

## 2022-07-25 NOTE — Telephone Encounter (Signed)
She can start her Skyrizi injections 1 month after her last cosentyx injection, and we can schedule her appointment with me to line up with her first or second skyrizi injections. Thank you!

## 2022-07-25 NOTE — Telephone Encounter (Signed)
Patient Skyrizi patient assistance has been approved and scheduled to be delivered 11/7. She has a psoriasis follow up with you 11/29.  How would you like for her to start injections? Wait until follow up to start or start with a nurse visit asap and schedule 4 week injection with you, moving her scheduled 11/29 follow up about a week out?

## 2022-07-26 NOTE — Telephone Encounter (Signed)
Patient advised of information and scheduled appropriately. aw

## 2022-07-31 ENCOUNTER — Ambulatory Visit
Admission: RE | Admit: 2022-07-31 | Discharge: 2022-07-31 | Disposition: A | Discharge status: Home / Self Care | Payer: Medicare Other | Source: Ambulatory Visit | Attending: Ophthalmology | Admitting: Ophthalmology

## 2022-07-31 DIAGNOSIS — H53469 Homonymous bilateral field defects, unspecified side: Secondary | ICD-10-CM

## 2022-07-31 DIAGNOSIS — H53461 Homonymous bilateral field defects, right side: Secondary | ICD-10-CM | POA: Diagnosis not present

## 2022-07-31 MED ORDER — GADOPICLENOL 0.5 MMOL/ML IV SOLN
7.5000 mL | Freq: Once | INTRAVENOUS | Status: AC | PRN
  Administered 2022-07-31: 7.5 mL via INTRAVENOUS

## 2022-08-03 DIAGNOSIS — Z23 Encounter for immunization: Secondary | ICD-10-CM | POA: Diagnosis not present

## 2022-08-10 ENCOUNTER — Ambulatory Visit (INDEPENDENT_AMBULATORY_CARE_PROVIDER_SITE_OTHER): Payer: Medicare Other | Admitting: Dermatology

## 2022-08-10 DIAGNOSIS — L409 Psoriasis, unspecified: Secondary | ICD-10-CM

## 2022-08-10 NOTE — Progress Notes (Signed)
Patient here today to start Skyrizi injections for Psoriasis Vulgaris.  Discussed treatment and how to inject with patient. Patient self injected into right thigh with no issues or concerns. Patient will follow up with Dr. Laurence Ferrari in four weeks.  LOT: [id number]  EXP: NOV 2024  Kimberly Hartman, RMA  Documentation: I have reviewed the above documentation for accuracy and completeness, and I agree with the above.  Forest Gleason, MD

## 2022-08-11 ENCOUNTER — Encounter: Payer: Self-pay | Admitting: Family Medicine

## 2022-08-11 ENCOUNTER — Ambulatory Visit (INDEPENDENT_AMBULATORY_CARE_PROVIDER_SITE_OTHER): Payer: Medicare Other | Admitting: Family Medicine

## 2022-08-11 VITALS — BP 116/62 | HR 69 | Temp 97.3°F | Ht 63.0 in | Wt 175.0 lb

## 2022-08-11 DIAGNOSIS — M899 Disorder of bone, unspecified: Secondary | ICD-10-CM | POA: Diagnosis not present

## 2022-08-11 DIAGNOSIS — M85859 Other specified disorders of bone density and structure, unspecified thigh: Secondary | ICD-10-CM

## 2022-08-11 DIAGNOSIS — F321 Major depressive disorder, single episode, moderate: Secondary | ICD-10-CM | POA: Diagnosis not present

## 2022-08-11 DIAGNOSIS — M8580 Other specified disorders of bone density and structure, unspecified site: Secondary | ICD-10-CM | POA: Diagnosis not present

## 2022-08-11 DIAGNOSIS — M8588 Other specified disorders of bone density and structure, other site: Secondary | ICD-10-CM

## 2022-08-11 LAB — VITAMIN D 25 HYDROXY (VIT D DEFICIENCY, FRACTURES): VITD: 78.42 ng/mL (ref 30.00–100.00)

## 2022-08-11 MED ORDER — SERTRALINE HCL 50 MG PO TABS: 50.0000 mg | ORAL_TABLET | Freq: Every day | ORAL | Status: DC

## 2022-08-11 NOTE — Patient Instructions (Signed)
Go to the lab on the way out.   If you have mychart we'll likely use that to update you.    Take care.  Glad to see you. If your vitamin D is normal, then reclast is a reasonable option.

## 2022-08-11 NOTE — Progress Notes (Signed)
Smoking d/w pt.  ~1/2 PPD.  Discussed cessation.  D/w pt about DXA results and osteopenia path/phys in general, including vit D and calcium.  Discussed fracture risk. Reasonable to consider treatment with bisphosphonate, ie fosamax or similar.  D/w pt about risk benefit, especially GI sx, jaw and long bone pathology.    D/w pt about reclast treatment, she had discussed this with endocrinology.  We agreed to check vit D first. She had concern about GI upset with oral bisphosphonates.  Meds, vitals, and allergies reviewed.   ROS: Per HPI unless specifically indicated in ROS section   Nad Ncat Neck supple,no LA Rrr

## 2022-08-14 DIAGNOSIS — M8580 Other specified disorders of bone density and structure, unspecified site: Secondary | ICD-10-CM | POA: Insufficient documentation

## 2022-08-14 NOTE — Assessment & Plan Note (Signed)
If her vitamin D is normal, then reclast is a reasonable option.  Discussed coordinated this with endocrinology clinic. >15 minutes spent in face to face time with patient, >50% spent in counselling or coordination of care.

## 2022-08-15 ENCOUNTER — Telehealth: Payer: Self-pay | Admitting: Family Medicine

## 2022-08-15 ENCOUNTER — Encounter: Payer: Self-pay | Admitting: Dermatology

## 2022-08-15 NOTE — Telephone Encounter (Signed)
Spoke with patient. See results note.

## 2022-08-15 NOTE — Telephone Encounter (Signed)
Patient called and stated she was returning [place] call back.

## 2022-08-17 DIAGNOSIS — Z23 Encounter for immunization: Secondary | ICD-10-CM | POA: Diagnosis not present

## 2022-08-23 ENCOUNTER — Other Ambulatory Visit: Payer: Self-pay

## 2022-08-23 DIAGNOSIS — E118 Type 2 diabetes mellitus with unspecified complications: Secondary | ICD-10-CM

## 2022-08-23 MED ORDER — JANUVIA 100 MG PO TABS: 100.0000 mg | ORAL_TABLET | Freq: Every day | ORAL | 3 refills | Status: DC

## 2022-08-31 ENCOUNTER — Ambulatory Visit: Payer: Medicare Other | Admitting: Dermatology

## 2022-09-06 ENCOUNTER — Ambulatory Visit: Payer: Medicare Other

## 2022-09-06 DIAGNOSIS — G4733 Obstructive sleep apnea (adult) (pediatric): Secondary | ICD-10-CM | POA: Diagnosis not present

## 2022-09-06 DIAGNOSIS — R0683 Snoring: Secondary | ICD-10-CM

## 2022-09-07 ENCOUNTER — Ambulatory Visit (INDEPENDENT_AMBULATORY_CARE_PROVIDER_SITE_OTHER): Payer: Medicare Other | Admitting: Dermatology

## 2022-09-07 ENCOUNTER — Encounter: Payer: Self-pay | Admitting: Dermatology

## 2022-09-07 DIAGNOSIS — L409 Psoriasis, unspecified: Secondary | ICD-10-CM

## 2022-09-07 MED ORDER — SKYRIZI PEN 150 MG/ML ~~LOC~~ SOAJ: 150.0000 mg | SUBCUTANEOUS | 1 refills | Status: DC

## 2022-09-07 NOTE — Progress Notes (Signed)
   Follow-Up Visit   Subjective  Kimberly Hartman is a 71 y.o. female who presents for the following: Psoriasis (Patient here today for psoriasis follow up. Patient currently on skyrizi injection. Patient reports hasn't noticed a lot of improvement since on ;Concentyx patient has been on [employer] a month. No new active areas. Staying clear. Will be follow up with ra next week. ).  The following portions of the chart were reviewed this encounter and updated as appropriate:  Tobacco  Allergies  Meds  Problems  Med Hx  Surg Hx  Fam Hx      Review of Systems: No other skin or systemic complaints except as noted in HPI or Assessment and Plan.   Objective  Well appearing patient in no apparent distress; mood and affect are within normal limits.  A focused examination was performed including face, neck, feet, back. Relevant physical exam findings are noted in the Assessment and Plan.  back and feet Scaly pink plaques back and left foot   Assessment & Plan  Psoriasis back and feet  Chronic and persistent condition with duration or expected duration over one year. Condition is symptomatic/ bothersome to patient. Not currently at goal but improving on Skyrizi.   Psoriasis is a chronic non-curable, but treatable genetic/hereditary disease that may have other systemic features affecting other organ systems such as joints (Psoriatic Arthritis). It is associated with an increased risk of inflammatory bowel disease, heart disease, non-alcoholic fatty liver disease, and depression.    Joints at hands slightly better than before but she has had trigger finger injections.  Keep appointment with rheumatology to evaluate for psoriatic arthritis or other inflammatory arthritis  Continue Skyrizi Pen 150 mg/ml    Patient self injected Skyrizi 150 mg/ml pen into left upper thigh. Patient tolerated well with no adverse reactions  NDC 0074-2100-01 Lot [id number] Nov 2024    Risankizumab-rzaa (SKYRIZI  PEN) 150 MG/ML SOAJ - back and feet Inject 150 mg into the skin as directed.  Related Medications Risankizumab-rzaa (SKYRIZI PEN) 150 MG/ML SOAJ Inject 150 mg into the skin as directed. Every 12 weeks for maintenance.   Return for  6 month psoriasis . I, Ruthell Rummage, CMA, am acting as scribe for Forest Gleason, MD.  Documentation: I have reviewed the above documentation for accuracy and completeness, and I agree with the above.  Forest Gleason, MD

## 2022-09-07 NOTE — Patient Instructions (Addendum)
For scarring at arm  [email or web address] - for cover treatment for scarring at arms.   Theodis Shove, M.D. [address] [place], [place] [address] [phone]  [email or web address]   Due to recent changes in healthcare laws, you may see results of your pathology and/or laboratory studies on MyChart before the doctors have had a chance to review them. We understand that in some cases there may be results that are confusing or concerning to you. Please understand that not all results are received at the same time and often the doctors may need to interpret multiple results in order to provide you with the best plan of care or course of treatment. Therefore, we ask that you please give [place] 2 business days to thoroughly review all your results before contacting the office for clarification. Should we see a critical lab result, you will be contacted sooner.   If You Need Anything After Your Visit  If you have any questions or concerns for your doctor, please call our main line at [phone] and press option 4 to reach your doctor's medical assistant. If no one answers, please leave a voicemail as directed and we will return your call as soon as possible. Messages left after 4 pm will be answered the following business day.   You may also send [place] a message via [place]. We typically respond to MyChart messages within 1-2 business days.  For prescription refills, please ask your pharmacy to contact our office. Our fax number is [phone].  If you have an urgent issue when the clinic is closed that cannot wait until the next business day, you can page your doctor at the number below.    Please note that while we do our best to be available for urgent issues outside of office hours, we are not available 24/7.   If you have an urgent issue and are unable to reach [place], you may choose to seek medical care at your doctor's office, retail clinic, urgent care center, or emergency  room.  If you have a medical emergency, please immediately call 911 or go to the emergency department.  Pager Numbers  - Dr. Nehemiah Massed: [phone]  - Dr. Laurence Ferrari: [phone]  - Dr. Nicole Kindred: [phone]  In the event of inclement weather, please call our main line at [phone] for an update on the status of any delays or closures.  Dermatology Medication Tips: Please keep the boxes that topical medications come in in order to help keep track of the instructions about where and how to use these. Pharmacies typically print the medication instructions only on the boxes and not directly on the medication tubes.   If your medication is too expensive, please contact our office at [phone] option 4 or send [place] a message through [place].   We are unable to tell what your co-pay for medications will be in advance as this is different depending on your insurance coverage. However, we may be able to find a substitute medication at lower cost or fill out paperwork to get insurance to cover a needed medication.   If a prior authorization is required to get your medication covered by your insurance company, please allow [place] 1-2 business days to complete this process.  Drug prices often vary depending on where the prescription is filled and some pharmacies may offer cheaper prices.  The website www.goodrx.com contains coupons for medications through different pharmacies. The prices here do not account for what the cost may be with help from insurance (  it may be cheaper with your insurance), but the website can give you the price if you did not use any insurance.  - You can print the associated coupon and take it with your prescription to the pharmacy.  - You may also stop by our office during regular business hours and pick up a GoodRx coupon card.  - If you need your prescription sent electronically to a different pharmacy, notify our office through [facility] or by phone at [phone]  option 4.     Si Usted Necesita Algo Despus de Su Visita  Tambin puede enviarnos un mensaje a travs de Pharmacist, community. Por lo general respondemos a los mensajes de MyChart en el transcurso de 1 a 2 das hbiles.  Para renovar recetas, por favor pida a su farmacia que se ponga en contacto con nuestra oficina. Harland Dingwall de fax es [place] [phone].  Si tiene un asunto urgente cuando la clnica est cerrada y que no puede esperar hasta el siguiente da hbil, puede llamar/localizar a su doctor(a) al nmero que aparece a continuacin.   Por favor, tenga en cuenta que aunque hacemos todo lo posible para estar disponibles para asuntos urgentes fuera del horario de [place], no estamos disponibles las 24 horas del da, los 7 das de la [place].   Si tiene un problema urgente y no puede comunicarse con nosotros, puede optar por buscar atencin mdica  en el consultorio de su doctor(a), en una clnica privada, en un centro de atencin urgente o en una sala de emergencias.  Si tiene Engineering geologist, por favor llame inmediatamente al 911 o vaya a la sala de emergencias.  Nmeros de bper  - Dr. Nehemiah Massed: [phone]  - Dra. Moye: [phone]  - Dra. Nicole Kindred: [phone]  En caso de inclemencias del [place], por favor llame a Johnsie Kindred principal al [phone] para una actualizacin sobre el [place] de cualquier retraso o cierre.  Consejos para la medicacin en dermatologa: Por favor, guarde las cajas en las que vienen los medicamentos de uso tpico para ayudarle a seguir las instrucciones sobre dnde y cmo usarlos. Las farmacias generalmente imprimen las instrucciones del medicamento slo en las cajas y no directamente en los tubos del [place].   Si su medicamento es muy caro, por favor, pngase en contacto con Zigmund Daniel llamando al [phone] y presione la opcin 4 o envenos un mensaje a travs de Pharmacist, community.   No podemos decirle cul ser su copago por los medicamentos  por adelantado ya que esto es diferente dependiendo de la cobertura de su seguro. Sin embargo, es posible que podamos encontrar un medicamento sustituto a Electrical engineer un formulario para que el seguro cubra el medicamento que se considera necesario.   Si se requiere una autorizacin previa para que su compaa de seguros [place] su medicamento, por favor permtanos de 1 a 2 das hbiles para completar este proceso.  Los precios de los medicamentos varan con frecuencia dependiendo del Environmental consultant de dnde se surte la receta y alguna farmacias pueden ofrecer precios ms baratos.  El sitio web www.goodrx.com tiene cupones para medicamentos de Airline pilot. Los precios aqu no tienen en cuenta lo que podra costar con la ayuda del seguro (puede ser ms barato con su seguro), pero el sitio web puede darle el precio si no utiliz Research scientist (physical sciences).  - Puede imprimir el cupn correspondiente y llevarlo con su receta a la farmacia.  - Tambin puede pasar por nuestra oficina durante el horario de atencin  regular y recoger una tarjeta de cupones de GoodRx.  - Si necesita que su receta se enve electrnicamente a una farmacia diferente, informe a nuestra oficina a travs de MyChart de [place] o por telfono llamando al [phone] y presione la opcin 4.

## 2022-09-09 DIAGNOSIS — M65322 Trigger finger, left index finger: Secondary | ICD-10-CM | POA: Diagnosis not present

## 2022-09-09 DIAGNOSIS — M65332 Trigger finger, left middle finger: Secondary | ICD-10-CM | POA: Diagnosis not present

## 2022-09-14 ENCOUNTER — Ambulatory Visit: Payer: Medicare Other | Admitting: Adult Health

## 2022-09-14 DIAGNOSIS — G4733 Obstructive sleep apnea (adult) (pediatric): Secondary | ICD-10-CM | POA: Diagnosis not present

## 2022-09-14 DIAGNOSIS — M79641 Pain in right hand: Secondary | ICD-10-CM | POA: Diagnosis not present

## 2022-09-14 DIAGNOSIS — M810 Age-related osteoporosis without current pathological fracture: Secondary | ICD-10-CM | POA: Diagnosis not present

## 2022-09-14 DIAGNOSIS — M545 Low back pain, unspecified: Secondary | ICD-10-CM | POA: Diagnosis not present

## 2022-09-14 DIAGNOSIS — E669 Obesity, unspecified: Secondary | ICD-10-CM | POA: Diagnosis not present

## 2022-09-14 DIAGNOSIS — M79642 Pain in left hand: Secondary | ICD-10-CM | POA: Diagnosis not present

## 2022-09-14 DIAGNOSIS — M1991 Primary osteoarthritis, unspecified site: Secondary | ICD-10-CM | POA: Diagnosis not present

## 2022-09-14 DIAGNOSIS — L409 Psoriasis, unspecified: Secondary | ICD-10-CM | POA: Diagnosis not present

## 2022-09-14 DIAGNOSIS — Z6831 Body mass index (BMI) 31.0-31.9, adult: Secondary | ICD-10-CM | POA: Diagnosis not present

## 2022-09-21 ENCOUNTER — Telehealth (INDEPENDENT_AMBULATORY_CARE_PROVIDER_SITE_OTHER): Payer: Medicare Other | Admitting: Adult Health

## 2022-09-21 DIAGNOSIS — G4733 Obstructive sleep apnea (adult) (pediatric): Secondary | ICD-10-CM | POA: Diagnosis not present

## 2022-09-21 NOTE — Progress Notes (Signed)
Virtual Visit via Video Note  I connected with Kimberly Hartman on 09/21/22 at 11:00 AM EST by a video enabled telemedicine application and verified that I am speaking with the correct person using two identifiers.  Location: Patient: Home  Provider: Office    I discussed the limitations of evaluation and management by telemedicine and the availability of in person appointments. The patient expressed understanding and agreed to proceed.  History of Present Illness: 71 year old female seen for sleep consult July 21, 2022 for snoring, restless sleep and daytime sleepiness.  Patient was found to have mild sleep apnea on home sleep study  Today's virtual visit is a 45-monthfollow-up.  Patient was seen last visit for a sleep consult.  She had snoring and daytime sleepiness.  She was set up for a home sleep study that was completed on September 06, 2022 that showed mild sleep apnea with AHI at 12.9/hour and SpO2 low at 81%.  We reviewed her sleep study results and went over them in detail.  Patient education was given.  Went over treatment options including weight loss, oral appliance and CPAP therapy.  Patient has previously had a oral appliance with orthodontics and was unable to tolerate.  She would like to proceed with CPAP therapy.   Past Medical History:  Diagnosis Date   Allergy    Anxiety    Arthritis    Cataract    removed both , lens implants   COPD (chronic obstructive pulmonary disease) ([facility]    Diabetes mellitus without complication ([place]    Psoriasis    Thyroid disease     Current Outpatient Medications on File Prior to Visit  Medication Sig Dispense Refill   acetaminophen (TYLENOL) 325 MG tablet Take 650 mg by mouth every 6 (six) hours as needed.     atorvastatin (LIPITOR) 40 MG tablet Take 1 tablet (40 mg total) by mouth daily. 90 tablet 2   Calcium Carbonate-Vitamin D (CALCIUM-D) 600-400 MG-UNIT TABS Take 2 tablets by mouth daily.     cetirizine (ZYRTEC) 10 MG tablet Take 10  mg by mouth daily as needed for allergies.     Cholecalciferol (VITAMIN D3) 1000 units CAPS Take 1 capsule by mouth daily.     diphenhydrAMINE (BENADRYL) 25 MG tablet Take 25 mg by mouth daily as needed.     fluticasone (FLONASE) 50 MCG/ACT nasal spray Place into both nostrils daily as needed for allergies or rhinitis.     glucose blood (ONE TOUCH ULTRA TEST) test strip Use to check blood sugar once a day. 50 each 3   hydrocortisone 2.5 % cream Apply topically 2 (two) times daily. Apply to rash between the breast BID prn x up to 2 weeks. 28 g 0   Ibuprofen 200 MG CAPS Take 2 tablets by mouth daily as needed.     JANUVIA 100 MG tablet Take 1 tablet (100 mg total) by mouth daily. 90 tablet 3   levothyroxine (SYNTHROID) 100 MCG tablet TAKE 1 TABLET BY MOUTH IN THE  MORNING 90 tablet 3   Multiple Vitamins-Minerals (MULTIVITAMIN ADULTS 50+) TABS Take 1 tablet by mouth daily.     pioglitazone (ACTOS) 45 MG tablet Take 45 mg by mouth daily.     Risankizumab-rzaa (SKYRIZI PEN) 150 MG/ML SOAJ Inject 150 mg into the skin as directed. 1 mL 1   sertraline (ZOLOFT) 50 MG tablet Take 1 tablet (50 mg total) by mouth daily.     augmented betamethasone dipropionate (DIPROLENE-AF) 0.05 % ointment  (Patient  not taking: Reported on 09/21/2022)     Risankizumab-rzaa (SKYRIZI PEN) 150 MG/ML SOAJ Inject 150 mg into the skin as directed. Every 12 weeks for maintenance. 1 mL 1   No current facility-administered medications on file prior to visit.     Observations/Objective:   Assessment and Plan: Mild OSA   Plan  Patient Instructions  Begin CPAP At bedtime  , wear all night long for at least 6hr or more.  Work on healthy weight loss Do not drive if sleepy Healthy sleep regimen Follow-up in 3 months and As needed      Follow Up Instructions:    I discussed the assessment and treatment plan with the patient. The patient was provided an opportunity to ask questions and all were answered. The patient agreed  with the plan and demonstrated an understanding of the instructions.   The patient was advised to call back or seek an in-person evaluation if the symptoms worsen or if the condition fails to improve as anticipated.  I provided 22  minutes of non-face-to-face time during this encounter.   Rexene Edison, NP

## 2022-09-21 NOTE — Patient Instructions (Signed)
Begin CPAP At bedtime  , wear all night long for at least 6hr or more.  Work on healthy weight loss Do not drive if sleepy Healthy sleep regimen Follow-up in 3 months and As needed

## 2022-10-20 DIAGNOSIS — H5340 Unspecified visual field defects: Secondary | ICD-10-CM | POA: Diagnosis not present

## 2022-10-26 DIAGNOSIS — M8589 Other specified disorders of bone density and structure, multiple sites: Secondary | ICD-10-CM | POA: Diagnosis not present

## 2022-11-04 ENCOUNTER — Emergency Department
Admission: EM | Admit: 2022-11-04 | Discharge: 2022-11-04 | Disposition: A | Discharge status: Home / Self Care | Payer: Medicare Other | Attending: Emergency Medicine | Admitting: Emergency Medicine

## 2022-11-04 ENCOUNTER — Encounter: Payer: Self-pay | Admitting: Emergency Medicine

## 2022-11-04 ENCOUNTER — Emergency Department: Payer: Medicare Other

## 2022-11-04 ENCOUNTER — Other Ambulatory Visit: Payer: Self-pay

## 2022-11-04 ENCOUNTER — Telehealth: Payer: Self-pay | Admitting: Family Medicine

## 2022-11-04 DIAGNOSIS — T50995A Adverse effect of other drugs, medicaments and biological substances, initial encounter: Secondary | ICD-10-CM | POA: Diagnosis not present

## 2022-11-04 DIAGNOSIS — M25511 Pain in right shoulder: Secondary | ICD-10-CM | POA: Insufficient documentation

## 2022-11-04 DIAGNOSIS — M549 Dorsalgia, unspecified: Secondary | ICD-10-CM | POA: Diagnosis not present

## 2022-11-04 DIAGNOSIS — M25512 Pain in left shoulder: Secondary | ICD-10-CM | POA: Insufficient documentation

## 2022-11-04 DIAGNOSIS — N390 Urinary tract infection, site not specified: Secondary | ICD-10-CM

## 2022-11-04 DIAGNOSIS — D72829 Elevated white blood cell count, unspecified: Secondary | ICD-10-CM | POA: Diagnosis not present

## 2022-11-04 DIAGNOSIS — R0789 Other chest pain: Secondary | ICD-10-CM | POA: Diagnosis not present

## 2022-11-04 DIAGNOSIS — M2550 Pain in unspecified joint: Secondary | ICD-10-CM

## 2022-11-04 DIAGNOSIS — M25551 Pain in right hip: Secondary | ICD-10-CM | POA: Insufficient documentation

## 2022-11-04 DIAGNOSIS — M25552 Pain in left hip: Secondary | ICD-10-CM | POA: Insufficient documentation

## 2022-11-04 DIAGNOSIS — R079 Chest pain, unspecified: Secondary | ICD-10-CM | POA: Diagnosis not present

## 2022-11-04 DIAGNOSIS — Z1152 Encounter for screening for COVID-19: Secondary | ICD-10-CM | POA: Insufficient documentation

## 2022-11-04 DIAGNOSIS — T50905A Adverse effect of unspecified drugs, medicaments and biological substances, initial encounter: Secondary | ICD-10-CM

## 2022-11-04 LAB — CBC WITH DIFFERENTIAL/PLATELET
Abs Immature Granulocytes: 0.11 10*3/uL — ABNORMAL HIGH (ref 0.00–0.07)
Basophils Absolute: 0.1 10*3/uL (ref 0.0–0.1)
Basophils Relative: 1 %
Eosinophils Absolute: 0.1 10*3/uL (ref 0.0–0.5)
Eosinophils Relative: 1 %
HCT: 35.2 % — ABNORMAL LOW (ref 36.0–46.0)
Hemoglobin: 11.3 g/dL — ABNORMAL LOW (ref 12.0–15.0)
Immature Granulocytes: 1 %
Lymphocytes Relative: 23 %
Lymphs Abs: 2.9 10*3/uL (ref 0.7–4.0)
MCH: 28 pg (ref 26.0–34.0)
MCHC: 32.1 g/dL (ref 30.0–36.0)
MCV: 87.3 fL (ref 80.0–100.0)
Monocytes Absolute: 1 10*3/uL (ref 0.1–1.0)
Monocytes Relative: 8 %
Neutro Abs: 8.6 10*3/uL — ABNORMAL HIGH (ref 1.7–7.7)
Neutrophils Relative %: 66 %
Platelets: 379 10*3/uL (ref 150–400)
RBC: 4.03 MIL/uL (ref 3.87–5.11)
RDW: 14 % (ref 11.5–15.5)
WBC: 12.9 10*3/uL — ABNORMAL HIGH (ref 4.0–10.5)
nRBC: 0 % (ref 0.0–0.2)

## 2022-11-04 LAB — URINALYSIS, ROUTINE W REFLEX MICROSCOPIC
Bacteria, UA: NONE SEEN
Bilirubin Urine: NEGATIVE
Glucose, UA: NEGATIVE mg/dL
Ketones, ur: NEGATIVE mg/dL
Nitrite: NEGATIVE
Protein, ur: 30 mg/dL — AB
Specific Gravity, Urine: 1.025 (ref 1.005–1.030)
WBC, UA: >50 WBC/hpf (ref 0–5)
pH: 5 (ref 5.0–8.0)

## 2022-11-04 LAB — HEPATIC FUNCTION PANEL
ALT: 18 U/L (ref 0–44)
AST: 27 U/L (ref 15–41)
Albumin: 3.3 g/dL — ABNORMAL LOW (ref 3.5–5.0)
Alkaline Phosphatase: 75 U/L (ref 38–126)
Bilirubin, Direct: 0.3 mg/dL — ABNORMAL HIGH (ref 0.0–0.2)
Indirect Bilirubin: 0.5 mg/dL (ref 0.3–0.9)
Total Bilirubin: 0.8 mg/dL (ref 0.3–1.2)
Total Protein: 7.4 g/dL (ref 6.5–8.1)

## 2022-11-04 LAB — BASIC METABOLIC PANEL
Anion gap: 8 (ref 5–15)
BUN: 18 mg/dL (ref 8–23)
CO2: 28 mmol/L (ref 22–32)
Calcium: 9.2 mg/dL (ref 8.9–10.3)
Chloride: 101 mmol/L (ref 98–111)
Creatinine, Ser: 0.57 mg/dL (ref 0.44–1.00)
GFR, Estimated: >60 mL/min (ref >60)
Glucose, Bld: 136 mg/dL — ABNORMAL HIGH (ref 70–99)
Potassium: 4.6 mmol/L (ref 3.5–5.1)
Sodium: 137 mmol/L (ref 135–145)

## 2022-11-04 LAB — CK: Total CK: 42 U/L (ref 38–234)

## 2022-11-04 LAB — RESP PANEL BY RT-PCR (RSV, FLU A&B, COVID)  RVPGX2
Influenza A by PCR: NEGATIVE
Influenza B by PCR: NEGATIVE
Resp Syncytial Virus by PCR: NEGATIVE
SARS Coronavirus 2 by RT PCR: NEGATIVE

## 2022-11-04 LAB — TROPONIN I (HIGH SENSITIVITY): Troponin I (High Sensitivity): 4 ng/L (ref <18)

## 2022-11-04 MED ORDER — ACETAMINOPHEN 500 MG PO TABS
1000.0000 mg | ORAL_TABLET | Freq: Once | ORAL | Status: AC
  Administered 2022-11-04: 1000 mg via ORAL
  Filled 2022-11-04: qty 2

## 2022-11-04 MED ORDER — LIDOCAINE 5 % EX PTCH: 1.0000 | MEDICATED_PATCH | Freq: Two times a day (BID) | CUTANEOUS | 0 refills | Status: AC

## 2022-11-04 MED ORDER — KETOROLAC TROMETHAMINE 15 MG/ML IJ SOLN
15.0000 mg | Freq: Once | INTRAMUSCULAR | Status: AC
  Administered 2022-11-04: 15 mg via INTRAMUSCULAR
  Filled 2022-11-04: qty 1

## 2022-11-04 MED ORDER — IBUPROFEN 600 MG PO TABS: 600.0000 mg | ORAL_TABLET | Freq: Four times a day (QID) | ORAL | 0 refills | Status: AC | PRN

## 2022-11-04 MED ORDER — CEPHALEXIN 500 MG PO CAPS: 500.0000 mg | ORAL_CAPSULE | Freq: Two times a day (BID) | ORAL | 0 refills | Status: AC

## 2022-11-04 MED ORDER — LIDOCAINE 5 % EX PTCH
1.0000 | MEDICATED_PATCH | CUTANEOUS | Status: DC
  Administered 2022-11-04: 1 via TRANSDERMAL
  Filled 2022-11-04: qty 1

## 2022-11-04 NOTE — ED Notes (Signed)
Lab work recollected

## 2022-11-04 NOTE — Telephone Encounter (Signed)
Per chart review pt is at [facility] ED now. Sending note to Dr Damita Dunnings and Damita Dunnings pool.

## 2022-11-04 NOTE — Telephone Encounter (Signed)
Patient called and stated she had a reclast infusion on 10/26/2021 and stated she is experiencing back pain, shoulder pain, thigh pain and can barely walk. Patient was sent to access nurse.

## 2022-11-04 NOTE — Telephone Encounter (Signed)
Vera RN with access n;urse called and pt had reclast infusion about 1 wk ago; pt having pain in rt thigh, rt lower back, and shoulder and side. Pt can bearly walk due to pain. Endo advised pt to call PCP. No avialbe appts at [facility]. Pt to go to ED.    [facility] - Client TELEPHONE ADVICE RECORD AccessNurse Patient Name: Kimberly DURRETT RN Gender: Female DOB: 1951-02-09 Age: 72 Y 57 M 23 D Return Phone Number: [phone] (Primary) Address: City/ State/ Zip: [place] [address] Client [place] [facility] - Client Client Site [place] - Day Provider Renford Dills - MD Contact Type Call Who Is Calling Patient / Member / Family / Caregiver Call Type Triage / Clinical Relationship To Patient Self Return Phone Number [phone] (Primary) Chief Complaint Walking difficulty Reason for Call Symptomatic / Request for [place] states she can barely walk,back ,shoulder and thigh pain. State she had a reclast infusion last week. Translation No Nurse Assessment Nurse: Raphael Gibney, RN, Vanita Ingles Date/Time (Eastern Time): 11/04/2022 11:53:38 AM Confirm and document reason for call. If symptomatic, describe symptoms. ---Caller states she had reclast infusion over a week ago and it was her first infusion. has pain in her left lower back. right thigh and hip and across her shoulders and having difficulty walking. pain level 6. Taking advil. no fever. Called her endocrinologist who said to call her PCP. Does the patient have any new or worsening symptoms? ---Yes Will a triage be completed? ---Yes Related visit to physician within the last 2 weeks? ---No Does the PT have any chronic conditions? (i.e. diabetes, asthma, this includes High risk factors for pregnancy, etc.) ---Yes List chronic conditions. ---osteopenia; diabetes; thyroid disease Is this a behavioral health or substance abuse call?  ---No Guidelines Guideline Title Affirmed Question Affirmed Notes Nurse Date/Time Eilene Ghazi Time) Leg Pain [1] Thigh or calf pain AND [2] only 1 side AND [3] present > 1 hour (Exception: Chronic unchanged pain.) Raphael Gibney, RN, Vanita Ingles 11/04/2022 11:58:08 AM PLEASE NOTE: All timestamps contained within this report are represented as [place] Standard Time. CONFIDENTIALTY NOTICE: This fax transmission is intended only for the addressee. It contains information that is legally privileged, confidential or otherwise protected from use or disclosure. If you are not the intended recipient, you are strictly prohibited from reviewing, disclosing, copying using or disseminating any of this information or taking any action in reliance on or regarding this information. If you have received this fax in error, please notify us immediately by telephone so that we can arrange for its return to [place]. Phone: [phone], Toll-Free: [phone], Fax: [phone] Page: 2 of 2 Call Id: [id number] [place]. Time Eilene Ghazi Time) Disposition Final User 11/04/2022 11:34:18 AM Attempt made - message left Pryor Montes 11/04/2022 12:06:27 PM See HCP within 4 Hours (or PCP triage) Yes Raphael Gibney, RN, Vanita Ingles Final Disposition 11/04/2022 12:06:27 PM See HCP within 4 Hours (or PCP triage) Yes Raphael Gibney, RN, Doreatha Lew Disagree/Comply Comply Caller Understands Yes PreDisposition Call Doctor Care Advice Given Per Guideline SEE HCP (OR PCP TRIAGE) WITHIN 4 HOURS: * IF OFFICE WILL BE OPEN: You need to be seen within the next 3 or 4 hours. Call your doctor (or NP/PA) now or as soon as the office opens. CALL EMS IF: * You develop any chest pain or shortness of breath. CALL BACK IF: * You become worse CARE ADVICE given per Leg Pain (Adult) guideline. Comments User: Dannielle Burn,  RN Date/Time Eilene Ghazi Time): 11/04/2022 12:06:07 PM Called back line and gave report Caller states she had reclast infusion over a week ago and it was her  first infusion. has pain in her left lower back. right thigh and hip and across her shoulders and having difficulty walking. pain level 6. Taking advil. no fever. Called her endocrinologist who said to call her PCP. Triage outcome see physician within 4 hrs. no appts available. Advised pt to go to the ER. State she will Referrals [place]

## 2022-11-04 NOTE — Discharge Instructions (Addendum)
Take Tylenol 1 g every 8 hours.  Use the ibuprofen to help with pain.  Eat food with the ibuprofen if you develop any black tarry stools and return to the ER immediately and discontinue ibuprofen.  Use the pain patches.  Take the antibiotics for possible UTI, urine cultures pending.  Follow-up with your primary care doctor and return to the ER if develop fevers, swelling in the joint with redness or any other concerns

## 2022-11-04 NOTE — ED Triage Notes (Signed)
Pt in via POV c/o bilaterial joint pain in the shoulders, back and hips and having trouble walking after receiving an infusion for osteoporosis on 1/24.

## 2022-11-04 NOTE — ED Provider Notes (Signed)
[facility] Provider Note    Event Date/Time   First MD Initiated Contact with Patient 11/04/22 1346     (approximate)   History   Joint Pain   HPI  Kimberly Hartman is a 72 y.o. female history of osteoporosis who is status post Reclast infusion on 1/24 who now comes in with bilateral shoulder back hip pain.  She reports that this pain has started but increasingly worsened since the infusion.  She states that initially she had some flulike symptoms but that seems to have gotten a little bit better.  She reports a little bit of right chest wall pain.  She denies ever having this infusion previously.  She denies being able to follow-up yet with the doctors and the nurses told her to come into the ER to be evaluated.  She denies any falls or hitting her head.  Physical Exam   Triage Vital Signs: ED Triage Vitals  Enc Vitals Group     BP 11/04/22 1240 (!) 136/43     Pulse Rate 11/04/22 1240 70     Resp 11/04/22 1240 18     Temp 11/04/22 1240 97.6 F (36.4 C)     Temp Source 11/04/22 1240 Oral     SpO2 11/04/22 1240 97 %     Weight 11/04/22 1241 180 lb (81.6 kg)     Height 11/04/22 1241 '5\' 3"'$  (1.6 m)     Head Circumference --      Peak Flow --      Pain Score 11/04/22 1241 6     Pain Loc --      Pain Edu? --      Excl. in [place]? --     Most recent vital signs: Vitals:   11/04/22 1240  BP: (!) 136/43  Pulse: 70  Resp: 18  Temp: 97.6 F (36.4 C)  SpO2: 97%     General: Awake, no distress.  CV:  Good peripheral perfusion.  Resp:  Normal effort.  Abd:  No distention.  Other:  Patient has range of motion intact of her shoulders.  No redness or warmth.  Good distal pulses in her wrist.  She has an increased difficulty lifting up both legs up off the bed but a little bit more in the lower right leg.  No warmth or redness.  Abdomen is soft and nontender.  ED Results / Procedures / Treatments   Labs (all labs ordered are listed, but only abnormal  results are displayed) Labs Reviewed  BASIC METABOLIC PANEL - Abnormal; Notable for the following components:      Result Value   Glucose, Bld 136 (*)    All other components within normal limits  RESP PANEL BY RT-PCR (RSV, FLU A&B, COVID)  RVPGX2  CBC WITH DIFFERENTIAL/PLATELET  CBC WITH DIFFERENTIAL/PLATELET  URINALYSIS, ROUTINE W REFLEX MICROSCOPIC  HEPATIC FUNCTION PANEL  CK  TROPONIN I (HIGH SENSITIVITY)     EKG  My interpretation of EKG:  Normal sinus rate 67 without any ST elevation or T wave inversions, normal intervals  RADIOLOGY I have reviewed the xray personally and interpreted and no evidence of any pneumonia   PROCEDURES:  Critical Care performed: No  Procedures   MEDICATIONS ORDERED IN ED: Medications  lidocaine (LIDODERM) 5 % 1 patch (1 patch Transdermal Patch Applied 11/04/22 1436)  acetaminophen (TYLENOL) tablet 1,000 mg (1,000 mg Oral Given 11/04/22 1432)  ketorolac (TORADOL) 15 MG/ML injection 15 mg (15 mg Intramuscular Given 11/04/22 1433)  IMPRESSION / MDM / ASSESSMENT AND PLAN / ED COURSE  I reviewed the triage vital signs and the nursing notes.   Patient's presentation is most consistent with acute presentation with potential threat to life or bodily function.   Differential is rhabdo, AKI, liver dysfunction, ACS, UTI, COVID, flu.  Suspect however this is most likely drug reaction.  Will give some Tylenol, Toradol, lidocaine patches and see if this helps improve patient's symptoms.  CBC shows slightly elevated white count hemoglobin around 11.3.  Troponin was negative.  LFTs normal.  CK normal.  BMP reassuring COVID flu are negative urine does look concerning for UTI with significant WBCs in it we will send for urine culture.  Reevaluation patient is feeling better her.  Discussed with patient denies any melena like stools or issues with GI bleeding in the past to suggest cause for slightly low hemoglobin.  We discussed to monitor the for  symptoms.  She reports significant improvement with the Toradol.  We discussed using some ibuprofen at home but monitoring for any issues with GI bleeding to immediately stop taking the medication and return to the ER.  She also had improvement with the lidocaine.  Patient has been up and ambulatory.  She does report some increased urinary frequency so will cover for UTI.  Repeat abdominal exam is soft and nontender.  She got no significant RBCs in her urine to suggest this being a kidney stone and her pain seems to be all over her joints and more with movement.  Does not sound consistent with kidney stone  At this time patient feels comfortable with discharge home and will follow-up with her primary care doctor     FINAL CLINICAL IMPRESSION(S) / ED DIAGNOSES   Final diagnoses:  Medication reaction, initial encounter  Urinary tract infection without hematuria, site unspecified  Arthralgia, unspecified joint     Rx / DC Orders   ED Discharge Orders          Ordered    ibuprofen (ADVIL) 600 MG tablet  Every 6 hours PRN        11/04/22 1619    lidocaine (LIDODERM) 5 %  Every 12 hours        11/04/22 1619    cephALEXin (KEFLEX) 500 MG capsule  2 times daily        11/04/22 1619             Note:  This document was prepared using Dragon voice recognition software and may include unintentional dictation errors.   Vanessa Wright, MD 11/04/22 [phone]

## 2022-11-05 ENCOUNTER — Telehealth: Payer: Self-pay | Admitting: Family Medicine

## 2022-11-05 NOTE — Telephone Encounter (Signed)
Please get update on patient after ER eval.  Thanks.

## 2022-11-06 LAB — URINE CULTURE: Culture: <10000 — AB

## 2022-11-06 NOTE — Telephone Encounter (Signed)
Noted. Thanks.

## 2022-11-07 ENCOUNTER — Telehealth: Payer: Self-pay | Admitting: *Deleted

## 2022-11-07 NOTE — Telephone Encounter (Signed)
Called and spoke with patient she has appt scheduled with [place] on Thursday 11/10/22 to follow up. The ED determined they think the Reclast was causing the issues with her joint pain. The pain is not as severe today and she states she has been using lidocaine patches on the worst spots for the day. They incidentally found a UTI so she is currently on an antibiotic for that. Advised to let us know if she needs anything in the meantime and we will plan to see her Thursday.

## 2022-11-07 NOTE — Telephone Encounter (Signed)
Noted. Thanks.

## 2022-11-07 NOTE — Patient Outreach (Signed)
  Care Coordination [facility] Note Transition Care Management Unsuccessful Follow-up Telephone Call  Date of discharge and from where:  [facility] EMMI ER [id number]  Attempts:  1st Attempt  Reason for unsuccessful TCM follow-up call:  Left voice message  [place] Management [phone]

## 2022-11-08 ENCOUNTER — Telehealth: Payer: Self-pay | Admitting: *Deleted

## 2022-11-08 NOTE — Patient Outreach (Signed)
  Care Coordination [facility] Note Transition Care Management Follow-up Telephone Call Date of discharge and from where: [facility] EMMI ER [id number] How have you been since you were released from the hospital? Definitely better, but I still have a little joint discomfort. The ibuprofen and the lidocaine patches help. Any questions or concerns? No  Items Reviewed: Did the pt receive and understand the discharge instructions provided? Yes  Medications obtained and verified? Yes  Other? No  Any new allergies since your discharge? No  Dietary orders reviewed? No Do you have support at home? Yes   Home Care and Equipment/Supplies: Were home health services ordered? no If so, what is the name of the agency? N/a  Has the agency set up a time to come to the patient's home? not applicable Were any new equipment or medical supplies ordered?  No What is the name of the medical supply agency? N/a  Were you able to get the supplies/equipment? not applicable Do you have any questions related to the use of the equipment or supplies? No  Functional Questionnaire: (I = Independent and D = Dependent) ADLs: I  Bathing/Dressing- I  Meal Prep- I  Eating- I  Maintaining continence- I  Transferring/Ambulation- I  Managing Meds- I  Follow up appointments reviewed:  PCP Hospital f/u appt confirmed? Yes  Scheduled to see Dr Elsie Stain [phone]  on 3:30 . [facility] f/u appt confirmed? No  . Are transportation arrangements needed?  y If their condition worsens, is the pt aware to call PCP or go to the Emergency Dept.? Yes Was the patient provided with contact information for the PCP's office or ED? Yes Was to pt encouraged to call back with questions or concerns? Yes  SDOH assessments and interventions completed:   Yes SDOH Interventions Today    Flowsheet Row Most Recent Value  SDOH Interventions   Food Insecurity Interventions Intervention Not Indicated  Housing Interventions  Intervention Not Indicated  Transportation Interventions Intervention Not Indicated       Care Coordination Interventions:  No Care Coordination interventions needed at this time.   Encounter Outcome:  Pt. Visit Completed  [place] [facility] [phone]

## 2022-11-09 ENCOUNTER — Ambulatory Visit (INDEPENDENT_AMBULATORY_CARE_PROVIDER_SITE_OTHER): Payer: Medicare Other

## 2022-11-09 VITALS — Ht 63.0 in | Wt 180.0 lb

## 2022-11-09 DIAGNOSIS — Z0000 Encounter for general adult medical examination without abnormal findings: Secondary | ICD-10-CM | POA: Diagnosis not present

## 2022-11-09 DIAGNOSIS — Z1231 Encounter for screening mammogram for malignant neoplasm of breast: Secondary | ICD-10-CM

## 2022-11-09 NOTE — Progress Notes (Signed)
Virtual Visit via Telephone Note  I connected with  Kimberly Hartman on 11/09/22 at  8:45 AM EST by telephone and verified that I am speaking with the correct person using two identifiers.  Location: Patient: home Provider: [facility] Persons participating in the virtual visit: patient/Nurse Health Advisor   I discussed the limitations, risks, security and privacy concerns of performing an evaluation and management service by telephone and the availability of in person appointments. The patient expressed understanding and agreed to proceed.  Interactive audio and video telecommunications were attempted between this nurse and patient, however failed, due to patient having technical difficulties OR patient did not have access to video capability.  We continued and completed visit with audio only.  Some vital signs may be absent or patient reported.   Kimberly Shelter, LPN  Subjective:   Kimberly Hartman is a 72 y.o. female who presents for Medicare Annual (Subsequent) preventive examination.  Review of Systems     Cardiac Risk Factors include: advanced age (>35mn, >>70women);diabetes mellitus;dyslipidemia;obesity (BMI >30kg/m2)     Objective:    Today's Vitals   11/09/22 0832 11/09/22 0834  Weight: 180 lb (81.6 kg)   Height: 5' 3"$  (1.6 m)   PainSc:  4    Body mass index is 31.89 kg/m.     11/09/2022    8:46 AM 11/04/2022   12:43 PM 11/04/2021    8:58 AM 10/25/2019   12:09 PM 05/26/2019   11:01 AM 03/14/2019    9:56 AM 11/22/2018   11:00 AM  Advanced Directives  Does Patient Have a Medical Advance Directive? Yes Yes Yes Yes Yes Yes Yes  Type of [place]amed[place]of [place] will  [place] will [place]Living will [place]Living will [place]Living will [place]Living will  Does patient want to make changes to medical advance directive?   Yes  (MAU/Ambulatory/Procedural Areas - Information given)   No - Patient declined No - Patient declined  Copy of [place] Chart? Yes - validated most recent copy scanned in chart (See row information)  Yes - validated most recent copy scanned in chart (See row information) No - copy requested No - copy requested No - copy requested No - copy requested    Current Medications (verified) Outpatient Encounter Medications as of 11/09/2022  Medication Sig   acetaminophen (TYLENOL) 325 MG tablet Take 650 mg by mouth every 6 (six) hours as needed.   atorvastatin (LIPITOR) 40 MG tablet Take 1 tablet (40 mg total) by mouth daily.   Calcium Carbonate-Vitamin D (CALCIUM-D) 600-400 MG-UNIT TABS Take 2 tablets by mouth daily.   cephALEXin (KEFLEX) 500 MG capsule Take 1 capsule (500 mg total) by mouth 2 (two) times daily for 7 days.   cetirizine (ZYRTEC) 10 MG tablet Take 10 mg by mouth daily as needed for allergies.   Cholecalciferol (VITAMIN D3) 1000 units CAPS Take 1 capsule by mouth daily.   diphenhydrAMINE (BENADRYL) 25 MG tablet Take 25 mg by mouth daily as needed.   glucose blood (ONE TOUCH ULTRA TEST) test strip Use to check blood sugar once a day.   hydrocortisone 2.5 % cream Apply topically 2 (two) times daily. Apply to rash between the breast BID prn x up to 2 weeks.   ibuprofen (ADVIL) 600 MG tablet Take 1 tablet (600 mg total) by mouth every 6 (six) hours as needed for up to 7 days.  JANUVIA 100 MG tablet Take 1 tablet (100 mg total) by mouth daily.   levothyroxine (SYNTHROID) 100 MCG tablet TAKE 1 TABLET BY MOUTH IN THE  MORNING   lidocaine (LIDODERM) 5 % Place 1 patch onto the skin every 12 (twelve) hours for 5 days. Remove & Discard patch within 12 hours or as directed by MD and leave off 12 hours before placing new patch   Multiple Vitamins-Minerals (MULTIVITAMIN ADULTS 50+) TABS Take 1 tablet by mouth daily.   pioglitazone (ACTOS) 45 MG tablet Take 45 mg by mouth daily.    Risankizumab-rzaa (SKYRIZI PEN) 150 MG/ML SOAJ Inject 150 mg into the skin as directed. Every 12 weeks for maintenance.   sertraline (ZOLOFT) 50 MG tablet Take 1 tablet (50 mg total) by mouth daily.   augmented betamethasone dipropionate (DIPROLENE-AF) 0.05 % ointment  (Patient not taking: Reported on 09/21/2022)   fluticasone (FLONASE) 50 MCG/ACT nasal spray Place into both nostrils daily as needed for allergies or rhinitis. (Patient not taking: Reported on 11/09/2022)   Risankizumab-rzaa (SKYRIZI PEN) 150 MG/ML SOAJ Inject 150 mg into the skin as directed.   No facility-administered encounter medications on file as of 11/09/2022.    Allergies (verified) Chantix [varenicline tartrate], Glimepiride, Metformin and related, Neo-bacit-poly-lidocaine, Penicillins, Tape, Tetracyclines & related, and Wellbutrin [bupropion]   History: Past Medical History:  Diagnosis Date   Allergy    Anxiety    Arthritis    Cataract    removed both , lens implants   COPD (chronic obstructive pulmonary disease) ([place])    Diabetes mellitus without complication ([place])    Psoriasis    Thyroid disease    Past Surgical History:  Procedure Laterality Date   ABDOMINAL HYSTERECTOMY  2001   [place]   BREAST EXCISIONAL BIOPSY Right    benign   COLONOSCOPY     DENTAL SURGERY     EYE SURGERY  2018   Cataract   KNEE ARTHROSCOPY WITH MENISCAL REPAIR Right    x2   POLYPECTOMY     Family History  Problem Relation Age of Onset   Lung cancer Mother    Esophageal cancer Father    Stroke Maternal Grandmother    Heart disease Maternal Grandmother    Colon cancer Neg Hx    Rectal cancer Neg Hx    Stomach cancer Neg Hx    Colon polyps Neg Hx    Breast cancer Neg Hx    Social History   Socioeconomic History   Marital status: Married    Spouse name: [name]   Number of children: 0   Years of education: bachelors   Highest education level: Not on file   Occupational History   Not on file  Tobacco Use   Smoking status: Every Day    Packs/day: 0.25    Years: 50.00    Total pack years: 12.50    Types: Cigarettes   Smokeless tobacco: Never   Tobacco comments:    Pt states she smokes at most a half of a pack daily. 07/21/22 LW  Vaping Use   Vaping Use: Never used  Substance and Sexual Activity   Alcohol use: Yes    Comment: wine a few times a week   Drug use: Never   Sexual activity: Not Currently    Birth control/protection: Surgical  Other Topics Concern   Not on file  Social History Narrative   11/12/20   From: [place] originally, moved [place] [facility] 2013 to be near brother   Living: with husband, Gershon Mussel (2018) and dog [name]   Work: part-time for [employer] at [place]: 2 step children - ok relationship and step grandchildren, brother in [place]      Exercise: walking the dog   Diet: diabetic diet - tries to avoid carbs and sugar      Safety   Seat belts: Yes    Guns: No   Safe in relationships: Yes    Social Determinants of Health   Financial Resource Strain: Low Risk  (11/09/2022)   Overall Financial Resource Strain (CARDIA)    Difficulty of Paying Living Expenses: Not hard at all  Food Insecurity: No Food Insecurity (11/09/2022)   Hunger Vital Sign    Worried About Running Out of Food in the Last Year: Never true    [place] in the Last Year: Never true  Transportation Needs: No Transportation Needs (11/09/2022)   PRAPARE - Hydrologist (Medical): No    Lack of Transportation (Non-Medical): No  Physical Activity: Insufficiently Active (11/09/2022)   Exercise Vital Sign    Days of Exercise per Week: 4 days    Minutes of Exercise per Session: 20 min  Stress: No Stress Concern Present (11/09/2022)   [place]    Feeling of Stress : Not at all  Social Connections: Moderately  Isolated (11/09/2022)   Social Connection and Isolation Panel [NHANES]    Frequency of Communication with Friends and Family: More than three times a week    Frequency of Social Gatherings with Friends and Family: Twice a week    Attends Religious Services: Never    Printmaker: Patient refused    Attends Archivist Meetings: Never    Marital Status: Married    Tobacco Counseling Ready to quit: Not Answered Counseling given: Not Answered Tobacco comments: Pt states she smokes at most a half of a pack daily. 07/21/22 LW   Clinical Intake:  Pre-visit preparation completed: Yes  Pain Score: 4  Pain Type: Acute pain Pain Location: Generalized     BMI - recorded: 31.89 Nutritional Status: BMI > 30  Obese Nutritional Risks: None Diabetes: Yes CBG done?: No (pt says no) Did pt. bring in CBG monitor from home?: No (televisit)  How often do you need to have someone help you when you read instructions, pamphlets, or other written materials from your doctor or pharmacy?: 1 - Never  Diabetic?YES  Interpreter Needed?: No  Information entered by :: B.Delvis Kau,LPN   Activities of Daily Living    11/09/2022    8:46 AM 11/05/2022    4:03 PM  In your present state of health, do you have any difficulty performing the following activities:  Hearing? 0 0  Vision? 0 0  Difficulty concentrating or making decisions? 0 0  Walking or climbing stairs? 0 1  Dressing or bathing? 0 0  Doing errands, shopping? 0 0  Preparing Food and eating ? N N  Using the Toilet? N N  In the past six months, have you accidently leaked urine? N Y  Do you have problems with loss of bowel control? N N  Managing your Medications? N N  Managing your Finances? N N  Housekeeping or managing your Housekeeping? N N    Patient Care  Team: Tonia Ghent, MD as PCP - General (Family Medicine) Debbora Dus, [facility] as Pharmacist (Pharmacist)  Indicate any recent Medical  Services you may have received from other than [facility] providers in the past year (date may be approximate).     Assessment:   This is a routine wellness examination for [place].  Hearing/Vision screen Hearing Screening - Comments:: Adequate hearing Vision Screening - Comments:: Adequate vision w/glasses Dr Baldomero Lamy  Dietary issues and exercise activities discussed: Current Exercise Habits: Home exercise routine, Type of exercise: walking, Time (Minutes): 20, Frequency (Times/Week): 4, Weekly Exercise (Minutes/Week): 80, Intensity: Mild, Exercise limited by: None identified   Goals Addressed             This Visit's Progress    Patient Stated   On track    Starting 10/22/18, I will continue to walk at least 7000 steps 2-3 days per week.      Patient Stated   On track    10/25/2019, I will continue to walk my dog everyday for 30 minutes- 1 hour.     Patient Stated   Not on track    Would like to lose 10lbs        Depression Screen    11/09/2022    8:42 AM 11/17/2021    9:25 AM 11/04/2021    9:01 AM 05/13/2021    9:52 AM 11/12/2020   12:41 PM 01/01/2020   10:21 AM 11/08/2019    8:25 AM  PHQ 2/9 Scores  PHQ - 2 Score 0 0 0 2 1 2 6  $ PHQ- 9 Score  3  5 5 6 14    $ Fall Risk    11/09/2022    8:37 AM 11/05/2022    4:03 PM 11/04/2021    8:59 AM 11/12/2020   11:17 AM 10/25/2019   12:11 PM  Fall Risk   Falls in the past year? 0 0 0 0 0  Number falls in past yr: 0 0 0 0 0  Injury with Fall? 0 0 0  0  Risk for fall due to : No Fall Risks  No Fall Risks  Medication side effect  Follow up Education provided;Falls prevention discussed  Falls prevention discussed  Falls evaluation completed;Falls prevention discussed    FALL RISK PREVENTION PERTAINING TO THE HOME:  Any stairs in or around the home? Yes  If so, are there any without handrails? Yes  Home free of loose throw rugs in walkways, pet beds, electrical cords, etc? Yes  Adequate lighting in your home to reduce risk of falls? Yes    ASSISTIVE DEVICES UTILIZED TO PREVENT FALLS:  Life alert? No  Use of a cane, walker or w/c? No  Grab bars in the bathroom? No  Shower chair or bench in shower? No  Elevated toilet seat or a handicapped toilet? Yes   Cognitive Function:    10/25/2019   12:14 PM 10/22/2018    9:37 AM  MMSE - Mini Mental State Exam  Orientation to time 5 5  Orientation to Place 5 5  Registration 3 3  Attention/ Calculation 5 0  Recall 3 3  Language- name 2 objects  0  Language- repeat 1 1  Language- follow 3 step command  3  Language- read & follow direction  0  Write a sentence  0  Copy design  0  Total score  20        11/09/2022    8:51 AM  6CIT Screen  What Year?  0 points  What month? 0 points  What time? 0 points  Count back from 20 0 points  Months in reverse 0 points  Repeat phrase 0 points  Total Score 0 points    Immunizations Immunization History  Administered Date(s) Administered   Fluad Quad(high Dose 65+) 08/22/2020   Influenza, High Dose Seasonal PF 07/12/2019, 08/03/2022   Influenza,inj,Quad PF,6+ Mos 09/13/2018   Influenza-Unspecified 09/13/2018, 07/07/2021   PFIZER(Purple Top)SARS-COV-2 Vaccination 11/09/2019, 11/30/2019, 08/22/2020, 01/30/2021   PPD Test 06/17/2018   Pfizer Covid-19 Vaccine Bivalent Booster 49yr & up 09/11/2021   Pneumococcal Conjugate-13 10/19/2015   Pneumococcal Polysaccharide-23 03/27/2013, 11/17/2021   Tdap 09/12/2012   Zoster Recombinat (Shingrix) 10/18/2017, 06/17/2018    TDAP status: Up to date  Flu Vaccine status: Up to date  Pneumococcal vaccine status: Up to date  Covid-19 vaccine status: Completed vaccines  Qualifies for Shingles Vaccine? Yes   Zostavax completed Yes   Shingrix Completed?: Yes  Screening Tests Health Maintenance  Topic Date Due   COVID-19 Vaccine (6 - 2023-24 season) 06/03/2022   HEMOGLOBIN A1C  09/08/2022   DTaP/Tdap/Td (2 - Td or Tdap) 09/12/2022   Medicare Annual Wellness (AWV)  11/04/2022    Diabetic kidney evaluation - Urine ACR  11/17/2022   FOOT EXAM  11/17/2022   OPHTHALMOLOGY EXAM  06/03/2023   Diabetic kidney evaluation - eGFR measurement  11/05/2023   MAMMOGRAM  03/02/2024   COLONOSCOPY (Pts 45-482yrInsurance coverage will need to be confirmed)  04/19/2027   Pneumonia Vaccine 6536Years old  Completed   INFLUENZA VACCINE  Completed   DEXA SCAN  Completed   Hepatitis C Screening  Completed   Zoster Vaccines- Shingrix  Completed   HPV VACCINES  Aged Out    Health Maintenance  Health Maintenance Due  Topic Date Due   COVID-19 Vaccine (6 - 2023-24 season) 06/03/2022   HEMOGLOBIN A1C  09/08/2022   DTaP/Tdap/Td (2 - Td or Tdap) 09/12/2022   Medicare Annual Wellness (AWV)  11/04/2022   Diabetic kidney evaluation - Urine ACR  11/17/2022    Colorectal cancer screening: Type of screening: Colonoscopy. Completed yes. Repeat every 5 years  Mammogram status: Ordered yes. Pt provided with contact info and advised to call to schedule appt.   Bone Density status: Completed yes. Results reflect: Bone density results: OSTEOPENIA. Repeat every 2 years.  Lung Cancer Screening: (Low Dose CT Chest recommended if Age 73-80ears, 30 pack-year currently smoking OR have quit w/in 15years.) does not qualify.   Lung Cancer Screening Referral: no  Additional Screening:  Hepatitis C Screening: does not qualify; Completed no  Vision Screening: Recommended annual ophthalmology exams for early detection of glaucoma and other disorders of the eye. Is the patient up to date with their annual eye exam?  Yes  Who is the provider or what is the name of the office in which the patient attends annual eye exams? Dr WyBaldomero Lamyf pt is not established with a provider, would they like to be referred to a provider to establish care? No .   Dental Screening: Recommended annual dental exams for proper oral hygiene  Community Resource Referral / Chronic Care Management: CRR required this visit?  No    CCM required this visit?  No      Plan:     I have personally reviewed and noted the following in the patient's chart:   Medical and social history Use of alcohol, tobacco or illicit drugs  Current medications and supplements including opioid  prescriptions. Patient is not currently taking opioid prescriptions. Functional ability and status Nutritional status Physical activity Advanced directives List of other physicians Hospitalizations, surgeries, and ER visits in previous 12 months Vitals Screenings to include cognitive, depression, and falls Referrals and appointments  In addition, I have reviewed and discussed with patient certain preventive protocols, quality metrics, and best practice recommendations. A written personalized care plan for preventive services as well as general preventive health recommendations were provided to patient.     Kimberly Shelter, LPN   [phone]   Nurse Notes: Pt states she had a reclaspe infusion on 10/26/22 and has a reaction to [id number] she is doing better now (can get up the stairs) otherwise has been doing good.  Referral/order placed for MMG (due in May).

## 2022-11-09 NOTE — Patient Instructions (Signed)
Ms. Kimberly Hartman , Thank you for taking time to come for your Medicare Wellness Visit. I appreciate your ongoing commitment to your health goals. Please review the following plan we discussed and let me know if I can assist you in the future.   These are the goals we discussed:  Goals      Patient Stated     Starting 10/22/18, I will continue to walk at least 7000 steps 2-3 days per week.      Patient Stated     10/25/2019, I will continue to walk my dog everyday for 30 minutes- 1 hour.     Patient Stated     Would like to lose 10lbs         This is a list of the screening recommended for you and due dates:  Health Maintenance  Topic Date Due   COVID-19 Vaccine (6 - 2023-24 season) 06/03/2022   Hemoglobin A1C  09/08/2022   DTaP/Tdap/Td vaccine (2 - Td or Tdap) 09/12/2022   Medicare Annual Wellness Visit  11/04/2022   Yearly kidney health urinalysis for diabetes  11/17/2022   Complete foot exam   11/17/2022   Eye exam for diabetics  06/03/2023   Yearly kidney function blood test for diabetes  11/05/2023   Mammogram  03/02/2024   Colon Cancer Screening  04/19/2027   Pneumonia Vaccine  Completed   Flu Shot  Completed   DEXA scan (bone density measurement)  Completed   Hepatitis C Screening: USPSTF Recommendation to screen - Ages 54-79 yo.  Completed   Zoster (Shingles) Vaccine  Completed   HPV Vaccine  Aged Out    Advanced directives: yes  Conditions/risks identified: none   Next appointment: Follow up in one year for your annual wellness visit 11/15/2023 @ 9am/telephone   Preventive Care 65 Years and Older, Female Preventive care refers to lifestyle choices and visits with your health care provider that can promote health and wellness. What does preventive care include? A yearly physical exam. This is also called an annual well check. Dental exams once or twice a year. Routine eye exams. Ask your health care provider how often you should have your eyes checked. Personal  lifestyle choices, including: Daily care of your teeth and gums. Regular physical activity. Eating a healthy diet. Avoiding tobacco and drug use. Limiting alcohol use. Practicing safe sex. Taking low-dose aspirin every day. Taking vitamin and mineral supplements as recommended by your health care provider. What happens during an annual well check? The services and screenings done by your health care provider during your annual well check will depend on your age, overall health, lifestyle risk factors, and family history of disease. Counseling  Your health care provider may ask you questions about your: Alcohol use. Tobacco use. Drug use. Emotional well-being. Home and relationship well-being. Sexual activity. Eating habits. History of falls. Memory and ability to understand (cognition). Work and work Statistician. Reproductive health. Screening  You may have the following tests or measurements: Height, weight, and BMI. Blood pressure. Lipid and cholesterol levels. These may be checked every 5 years, or more frequently if you are over 70 years old. Skin check. Lung cancer screening. You may have this screening every year starting at age 64 if you have a 30-pack-year history of smoking and currently smoke or have quit within the past 15 years. Fecal occult blood test (FOBT) of the stool. You may have this test every year starting at age 6. Flexible sigmoidoscopy or colonoscopy. You may have a  sigmoidoscopy every 5 years or a colonoscopy every 10 years starting at age 66. Hepatitis C blood test. Hepatitis B blood test. Sexually transmitted disease (STD) testing. Diabetes screening. This is done by checking your blood sugar (glucose) after you have not eaten for a while (fasting). You may have this done every 1-3 years. Bone density scan. This is done to screen for osteoporosis. You may have this done starting at age 78. Mammogram. This may be done every 1-2 years. Talk to your  health care provider about how often you should have regular mammograms. Talk with your health care provider about your test results, treatment options, and if necessary, the need for more tests. Vaccines  Your health care provider may recommend certain vaccines, such as: Influenza vaccine. This is recommended every year. Tetanus, diphtheria, and acellular pertussis (Tdap, Td) vaccine. You may need a Td booster every 10 years. Zoster vaccine. You may need this after age 56. Pneumococcal 13-valent conjugate (PCV13) vaccine. One dose is recommended after age 48. Pneumococcal polysaccharide (PPSV23) vaccine. One dose is recommended after age 59. Talk to your health care provider about which screenings and vaccines you need and how often you need them. This information is not intended to replace advice given to you by your health care provider. Make sure you discuss any questions you have with your health care provider. Document Released: 10/16/2015 Document Revised: 06/08/2016 Document Reviewed: 07/21/2015 Elsevier Interactive Patient Education  2017 [employer] Prevention in the Home Falls can cause injuries. They can happen to people of all ages. There are many things you can do to make your home safe and to help prevent falls. What can I do on the outside of my home? Regularly fix the edges of walkways and driveways and fix any cracks. Remove anything that might make you trip as you walk through a door, such as a raised step or threshold. Trim any bushes or trees on the path to your home. Use bright outdoor lighting. Clear any walking paths of anything that might make someone trip, such as rocks or tools. Regularly check to see if handrails are loose or broken. Make sure that both sides of any steps have handrails. Any raised decks and porches should have guardrails on the edges. Have any leaves, snow, or ice cleared regularly. Use sand or salt on walking paths during winter. Clean  up any spills in your garage right away. This includes oil or grease spills. What can I do in the bathroom? Use night lights. Install grab bars by the toilet and in the tub and shower. Do not use towel bars as grab bars. Use non-skid mats or decals in the tub or shower. If you need to sit down in the shower, use a plastic, non-slip stool. Keep the floor dry. Clean up any water that spills on the floor as soon as it happens. Remove soap buildup in the tub or shower regularly. Attach bath mats securely with double-sided non-slip rug tape. Do not have throw rugs and other things on the floor that can make you trip. What can I do in the bedroom? Use night lights. Make sure that you have a light by your bed that is easy to reach. Do not use any sheets or blankets that are too big for your bed. They should not hang down onto the floor. Have a firm chair that has side arms. You can use this for support while you get dressed. Do not have throw rugs and other  things on the floor that can make you trip. What can I do in the kitchen? Clean up any spills right away. Avoid walking on wet floors. Keep items that you use a lot in easy-to-reach places. If you need to reach something above you, use a strong step stool that has a grab bar. Keep electrical cords out of the way. Do not use floor polish or wax that makes floors slippery. If you must use wax, use non-skid floor wax. Do not have throw rugs and other things on the floor that can make you trip. What can I do with my stairs? Do not leave any items on the stairs. Make sure that there are handrails on both sides of the stairs and use them. Fix handrails that are broken or loose. Make sure that handrails are as long as the stairways. Check any carpeting to make sure that it is firmly attached to the stairs. Fix any carpet that is loose or worn. Avoid having throw rugs at the top or bottom of the stairs. If you do have throw rugs, attach them to the  floor with carpet tape. Make sure that you have a light switch at the top of the stairs and the bottom of the stairs. If you do not have them, ask someone to add them for you. What else can I do to help prevent falls? Wear shoes that: Do not have high heels. Have rubber bottoms. Are comfortable and fit you well. Are closed at the toe. Do not wear sandals. If you use a stepladder: Make sure that it is fully opened. Do not climb a closed stepladder. Make sure that both sides of the stepladder are locked into place. Ask someone to hold it for you, if possible. Clearly mark and make sure that you can see: Any grab bars or handrails. First and last steps. Where the edge of each step is. Use tools that help you move around (mobility aids) if they are needed. These include: Canes. Walkers. Scooters. Crutches. Turn on the lights when you go into a dark area. Replace any light bulbs as soon as they burn out. Set up your furniture so you have a clear path. Avoid moving your furniture around. If any of your floors are uneven, fix them. If there are any pets around you, be aware of where they are. Review your medicines with your doctor. Some medicines can make you feel dizzy. This can increase your chance of falling. Ask your doctor what other things that you can do to help prevent falls. This information is not intended to replace advice given to you by your health care provider. Make sure you discuss any questions you have with your health care provider. Document Released: 07/16/2009 Document Revised: 02/25/2016 Document Reviewed: 10/24/2014 Elsevier Interactive Patient Education  2017 [employer].

## 2022-11-10 ENCOUNTER — Encounter: Payer: Self-pay | Admitting: Family Medicine

## 2022-11-10 ENCOUNTER — Ambulatory Visit (INDEPENDENT_AMBULATORY_CARE_PROVIDER_SITE_OTHER): Payer: Medicare Other | Admitting: Family Medicine

## 2022-11-10 VITALS — BP 122/50 | HR 74 | Temp 97.8°F | Ht 63.0 in | Wt 182.0 lb

## 2022-11-10 DIAGNOSIS — E118 Type 2 diabetes mellitus with unspecified complications: Secondary | ICD-10-CM | POA: Diagnosis not present

## 2022-11-10 DIAGNOSIS — M2550 Pain in unspecified joint: Secondary | ICD-10-CM | POA: Diagnosis not present

## 2022-11-10 DIAGNOSIS — R7989 Other specified abnormal findings of blood chemistry: Secondary | ICD-10-CM | POA: Diagnosis not present

## 2022-11-10 NOTE — Progress Notes (Signed)
D/w pt about recent events.  She clearly feels better today.   She had the infusion at outside clinic then clearly felt worse after that.  That led to ER eval.  She had fevers and chills.  Than had fatigue.  Aches got worse daily.  Went to ER after having difficulty getting out of bed.  Presumed UTI diagnosed at ER.  Tx'd with keflex in the meantime. ER labs/xray/course d/w pt.    She felt well enough to go to work on Tuesday.  R lower back and R C spine- paraspinal area- sore but improving.    Discussed rechecking labs given previous ER evaluation.  See notes on labs.  She has been using CPAP, it took a period of adjustment, and that is helping her sleep.    History of diabetes noted.  On Actos at baseline, with Januvia.  Due for labs.  See notes on labs.  Meds, vitals, and allergies reviewed.   ROS: Per HPI unless specifically indicated in ROS section   GEN: nad, alert and oriented HEENT: ncat NECK: supple w/o LA CV: rrr. PULM: ctab, no inc wob ABD: soft, +bs EXT: no edema SKIN: no acute rash

## 2022-11-10 NOTE — Patient Instructions (Addendum)
I would take ibuprofen as needed with food.   Take care.  Glad to see you. Go to the lab on the way out.   If you have mychart we'll likely use that to update you.

## 2022-11-11 LAB — CBC WITH DIFFERENTIAL/PLATELET
Basophils Absolute: 0.1 10*3/uL (ref 0.0–0.1)
Basophils Relative: 1.4 % (ref 0.0–3.0)
Eosinophils Absolute: 0.2 10*3/uL (ref 0.0–0.7)
Eosinophils Relative: 1.7 % (ref 0.0–5.0)
HCT: 36.9 % (ref 36.0–46.0)
Hemoglobin: 12.2 g/dL (ref 12.0–15.0)
Lymphocytes Relative: 25.1 % (ref 12.0–46.0)
Lymphs Abs: 2.4 10*3/uL (ref 0.7–4.0)
MCHC: 33.1 g/dL (ref 30.0–36.0)
MCV: 86.3 fl (ref 78.0–100.0)
Monocytes Absolute: 0.7 10*3/uL (ref 0.1–1.0)
Monocytes Relative: 7 % (ref 3.0–12.0)
Neutro Abs: 6.3 10*3/uL (ref 1.4–7.7)
Neutrophils Relative %: 64.8 % (ref 43.0–77.0)
Platelets: 450 10*3/uL — ABNORMAL HIGH (ref 150.0–400.0)
RBC: 4.28 Mil/uL (ref 3.87–5.11)
RDW: 13.8 % (ref 11.5–15.5)
WBC: 9.7 10*3/uL (ref 4.0–10.5)

## 2022-11-11 LAB — COMPREHENSIVE METABOLIC PANEL
ALT: 14 U/L (ref 0–35)
AST: 16 U/L (ref 0–37)
Albumin: 3.7 g/dL (ref 3.5–5.2)
Alkaline Phosphatase: 80 U/L (ref 39–117)
BUN: 17 mg/dL (ref 6–23)
CO2: 27 mEq/L (ref 19–32)
Calcium: 9.6 mg/dL (ref 8.4–10.5)
Chloride: 102 mEq/L (ref 96–112)
Creatinine, Ser: 0.57 mg/dL (ref 0.40–1.20)
GFR: 90.79 mL/min (ref >60.00)
Glucose, Bld: 96 mg/dL (ref 70–99)
Potassium: 4.1 mEq/L (ref 3.5–5.1)
Sodium: 141 mEq/L (ref 135–145)
Total Bilirubin: 0.2 mg/dL (ref 0.2–1.2)
Total Protein: 6.8 g/dL (ref 6.0–8.3)

## 2022-11-11 LAB — HEMOGLOBIN A1C: Hgb A1c MFr Bld: 7.7 % — ABNORMAL HIGH (ref 4.6–6.5)

## 2022-11-11 LAB — LIPID PANEL
Cholesterol: 123 mg/dL (ref 0–200)
HDL: 42.9 mg/dL (ref >39.00)
LDL Cholesterol: 47 mg/dL (ref 0–99)
NonHDL: 80.52
Total CHOL/HDL Ratio: 3
Triglycerides: 168 mg/dL — ABNORMAL HIGH (ref 0.0–149.0)
VLDL: 33.6 mg/dL (ref 0.0–40.0)

## 2022-11-11 LAB — MICROALBUMIN / CREATININE URINE RATIO
Creatinine,U: 86.7 mg/dL
Microalb Creat Ratio: 2.9 mg/g (ref 0.0–30.0)
Microalb, Ur: 2.5 mg/dL — ABNORMAL HIGH (ref 0.0–1.9)

## 2022-11-11 LAB — TSH: TSH: 3.45 u[IU]/mL (ref 0.35–5.50)

## 2022-11-13 DIAGNOSIS — M2550 Pain in unspecified joint: Secondary | ICD-10-CM | POA: Insufficient documentation

## 2022-11-13 NOTE — Assessment & Plan Note (Signed)
Continue Actos and Januvia as is.  See notes on labs.

## 2022-11-13 NOTE — Assessment & Plan Note (Signed)
Clearly better in the meantime.  This could have been partly related to UTI, and it makes sense to finish treatment for that with Keflex, but I presume the main issue was intolerance to Reclast.  Discussed not using again and rechecking her labs today.  At this point still okay for outpatient follow-up.

## 2022-11-18 ENCOUNTER — Encounter: Payer: Self-pay | Admitting: Family Medicine

## 2022-11-18 ENCOUNTER — Encounter: Payer: Medicare Other | Admitting: Family Medicine

## 2022-11-18 ENCOUNTER — Ambulatory Visit (INDEPENDENT_AMBULATORY_CARE_PROVIDER_SITE_OTHER): Payer: Medicare Other | Admitting: Family Medicine

## 2022-11-18 VITALS — BP 118/62 | HR 61 | Temp 97.0°F | Ht 63.0 in | Wt 182.0 lb

## 2022-11-18 DIAGNOSIS — E118 Type 2 diabetes mellitus with unspecified complications: Secondary | ICD-10-CM | POA: Diagnosis not present

## 2022-11-18 DIAGNOSIS — Z7189 Other specified counseling: Secondary | ICD-10-CM

## 2022-11-18 DIAGNOSIS — E039 Hypothyroidism, unspecified: Secondary | ICD-10-CM | POA: Diagnosis not present

## 2022-11-18 DIAGNOSIS — F325 Major depressive disorder, single episode, in full remission: Secondary | ICD-10-CM

## 2022-11-18 DIAGNOSIS — M8580 Other specified disorders of bone density and structure, unspecified site: Secondary | ICD-10-CM

## 2022-11-18 DIAGNOSIS — E785 Hyperlipidemia, unspecified: Secondary | ICD-10-CM

## 2022-11-18 DIAGNOSIS — Z0000 Encounter for general adult medical examination without abnormal findings: Secondary | ICD-10-CM

## 2022-11-18 NOTE — Patient Instructions (Addendum)
I wouldn't change your meds for now.   Update me as needed.  I'll await the follow up with endocrine.   Take care.  Glad to see you.

## 2022-11-18 NOTE — Progress Notes (Unsigned)
Diabetes:  Using medications without difficulties:yes Hypoglycemic episodes:no Hyperglycemic episodes:no Feet problems: no Blood Sugars averaging: ~150s in the AM, +/- 30 eye exam within last year:yes MALB positive.  ACE cautions d/w pt.    Mood d/w pt.  Still on sertraline.  Anxiety better.  No ADE on med.    Elevated Cholesterol: Using medications without problems: yes Muscle aches: not from statin.  Diet compliance: d/w pt.  Exercise: d/w pt.    Hypothyroidism.  TSH wnl.  Labs d/w pt, compliant.  No neck mass. Swallowing well.    Tdap 2013 Shingles prev done Covid prev done PNA up todate Flu prev done.  RSV vaccine d/w pt  Colonoscopy 2023 Mammogram 2023 DXA 2023 Husband [name] designated if patient were incapacitated.    Diet and exercise d/w pt.    Bone density d/w pt.  Would avoid other meds for now.  Would continue vit D, weight bearing exercise, discussed smoking cessation.  R lower back pain has improved in the meantime.  Still using lidocaine patch at night.   PMH and SH reviewed.   Vital signs, Meds and allergies reviewed.  ROS: Per HPI unless specifically indicated in ROS section   GEN: nad, alert and oriented HEENT: ncat NECK: supple w/o LA CV: rrr. PULM: ctab, no inc wob ABD: soft, +bs EXT: no edema SKIN: no acute rash  Diabetic foot exam: Normal inspection No skin breakdown No calluses  Normal DP pulses Normal sensation to light tough and monofilament Nails normal

## 2022-11-20 DIAGNOSIS — Z0000 Encounter for general adult medical examination without abnormal findings: Secondary | ICD-10-CM | POA: Insufficient documentation

## 2022-11-20 DIAGNOSIS — E785 Hyperlipidemia, unspecified: Secondary | ICD-10-CM | POA: Insufficient documentation

## 2022-11-20 NOTE — Assessment & Plan Note (Signed)
Reasonable to continue statin to try to decrease her vascular risk.  Continue work on diet and exercise.

## 2022-11-20 NOTE — Assessment & Plan Note (Signed)
Bone density d/w pt.  Would avoid other meds for now.  Would continue vit D, weight bearing exercise, discussed smoking cessation.  R lower back pain has improved in the meantime.  Still using lidocaine patch at night.  She will update me as needed.

## 2022-11-20 NOTE — Assessment & Plan Note (Signed)
Tdap 2013 Shingles prev done Covid prev done PNA up todate Flu prev done.  RSV vaccine d/w pt  Colonoscopy 2023 Mammogram 2023 DXA 2023 Husband [name] designated if patient were incapacitated.    Diet and exercise d/w pt.

## 2022-11-20 NOTE — Assessment & Plan Note (Signed)
Husband Kimberly Hartman designated if patient were incapacitated.

## 2022-11-20 NOTE — Assessment & Plan Note (Signed)
Still on sertraline.  Anxiety better.  No ADE on med.  Continue as is.

## 2022-11-20 NOTE — Assessment & Plan Note (Signed)
MALB positive.  ACE cautions d/w pt.   Defer adding extra medicines at this point given recent events.  Continue work on diet and exercise.  Continue Januvia and Actos.  We will update endocrinology as FYI regarding recent labs.

## 2022-11-20 NOTE — Assessment & Plan Note (Signed)
TSH wnl.  Labs d/w pt, compliant.  No neck mass. Swallowing well.   Continue levothyroxine as is.

## 2022-11-24 ENCOUNTER — Ambulatory Visit (INDEPENDENT_AMBULATORY_CARE_PROVIDER_SITE_OTHER): Payer: Medicare Other | Admitting: Adult Health

## 2022-11-24 ENCOUNTER — Encounter: Payer: Self-pay | Admitting: Adult Health

## 2022-11-24 VITALS — BP 108/58 | HR 78 | Temp 98.0°F | Ht 63.0 in | Wt 181.2 lb

## 2022-11-24 DIAGNOSIS — G4733 Obstructive sleep apnea (adult) (pediatric): Secondary | ICD-10-CM | POA: Insufficient documentation

## 2022-11-24 NOTE — Assessment & Plan Note (Signed)
Excellent control compliance on nocturnal CPAP.  We discussed other mask that she could try such as the DreamWear full facemask.  Also talked about things to do for skin issues from CPAP mask.  Patient complains that it leaves marks on her face pursing in the morning that take a while to go away.  We discussed some facial lotion and facial rollers that might help-cosmetic ideas..  Plan  Patient Instructions  Continue on CPAP At bedtime  , wear all night long for at least 6hr or more.  Work on healthy weight loss Do not drive if sleepy Healthy sleep regimen May try Dream wear full face  Can try facial lotion and tools (Roller and gua sha for "CPAP lines" )  Follow-up in 6 months and As needed

## 2022-11-24 NOTE — Progress Notes (Signed)
Reviewed and agree with assessment/plan.   Chesley Mires, MD [facility] Pulmonary/Critical Care 11/24/2022, 3:17 PM Pager:  [phone]

## 2022-11-24 NOTE — Patient Instructions (Addendum)
Continue on CPAP At bedtime  , wear all night long for at least 6hr or more.  Work on healthy weight loss Do not drive if sleepy Healthy sleep regimen May try Dream wear full face  Can try facial lotion and tools (Roller and gua sha for "CPAP lines" )  Follow-up in 6 months and As needed

## 2022-11-24 NOTE — Progress Notes (Signed)
$@PatientM$  ID: Kimberly Hartman, female    DOB: 04-23-1951, 72 y.o.   MRN: [id number]  Chief Complaint  Patient presents with   Follow-up    Referring provider: Tonia Ghent, MD  HPI: 72 year old female seen for sleep consult July 21, 2022 for snoring, restless sleep and daytime sleepiness found to have mild sleep apnea on home sleep study  TEST/EVENTS :  home sleep study that was completed on September 06, 2022 that showed mild sleep apnea with AHI at 12.9/hour and SpO2 low at 81%.    11/24/2022 Follow up ; OSA  Patient returns for a 40-monthfollow-up.  Patient was seen last visit to review sleep study results.  She had presented for consult in October for snoring and daytime sleepiness.  She was set up for home sleep study that was done in December 2023 that showed mild sleep apnea.  She was started on CPAP last visit.  Patient says that she is trying to get used to CPAP.  She is definitely seeing a difference with daytime sleepiness.  She does not have to get up and go to the bathroom the melanite.  Is feel more rested when she wakes up.  She is having some hard time getting used to the mask.  Says that she is try the nasal mask but was unable to tolerate it and is now currently using a fullface mask.  CPAP download shows excellent compliance with 97% usage.  Daily average usage at 7 hours.  Patient is on auto CPAP 5 to 15 cm H2O.  AHI 3.5/hour.  Daily average at 12.9 cm H2O.  He is recovering from a suspected drug reaction to Reclast.  She says she is starting to do better with less muscle and joint pains.   Allergies  Allergen Reactions   Chantix [Varenicline Tartrate]    Glimepiride     hypoglycemia   Metformin And Related    Reclast [Zoledronic Acid]     Aches, joint pain   Neo-Bacit-Poly-Lidocaine Itching, Swelling, Dermatitis and Rash    She can tolerate bacitracin.     Penicillins Rash    TOLERATED KEFLEX WITH NO ISSUES   Tape Dermatitis and Rash    THE ADHESIVE PART NOT  THE ACTUAL TAPE   Tetracyclines & Related Rash   Wellbutrin [Bupropion] Rash    Immunization History  Administered Date(s) Administered   COVID-19, mRNA, vaccine(Comirnaty)12 years and older 08/17/2022   Fluad Quad(high Dose 65+) 08/22/2020   Influenza, High Dose Seasonal PF 07/12/2019, 08/03/2022   Influenza,inj,Quad PF,6+ Mos 09/13/2018   Influenza-Unspecified 09/13/2018, 07/07/2021   PFIZER(Purple Top)SARS-COV-2 Vaccination 11/09/2019, 11/30/2019, 08/22/2020, 01/30/2021   PPD Test 06/17/2018   Pfizer Covid-19 Vaccine Bivalent Booster 129yr& up 09/11/2021   Pneumococcal Conjugate-13 10/19/2015   Pneumococcal Polysaccharide-23 03/27/2013, 11/17/2021   Tdap 09/12/2012   Zoster Recombinat (Shingrix) 10/18/2017, 06/17/2018    Past Medical History:  Diagnosis Date   Allergy    Anxiety    Arthritis    Cataract    removed both , lens implants   COPD (chronic obstructive pulmonary disease) ([facility]   Diabetes mellitus without complication ([facility]   Psoriasis    Thyroid disease     Tobacco History: Social History   Tobacco Use  Smoking Status Every Day   Packs/day: 1.00   Years: 50.00   Total pack years: 50.00   Types: Cigarettes  Smokeless Tobacco Never  Tobacco Comments   Smoking about 1/2 ppd.  Not trying to quit.  11/24/2022 hfb   Ready to quit: No Counseling given: Yes Tobacco comments: Smoking about 1/2 ppd.  Not trying to quit.  11/24/2022 hfb   Outpatient Medications Prior to Visit  Medication Sig Dispense Refill   acetaminophen (TYLENOL) 325 MG tablet Take 650 mg by mouth every 6 (six) hours as needed.     atorvastatin (LIPITOR) 40 MG tablet Take 1 tablet (40 mg total) by mouth daily. 90 tablet 2   Calcium Carbonate-Vitamin D (CALCIUM-D) 600-400 MG-UNIT TABS Take 2 tablets by mouth daily.     cetirizine (ZYRTEC) 10 MG tablet Take 10 mg by mouth daily as needed for allergies.     Cholecalciferol (VITAMIN D3) 1000 units CAPS Take 1 capsule by mouth daily.      diphenhydrAMINE (BENADRYL) 25 MG tablet Take 25 mg by mouth daily as needed.     glucose blood (ONE TOUCH ULTRA TEST) test strip Use to check blood sugar once a day. 50 each 3   hydrocortisone 2.5 % cream Apply topically 2 (two) times daily. Apply to rash between the breast BID prn x up to 2 weeks. 28 g 0   JANUVIA 100 MG tablet Take 1 tablet (100 mg total) by mouth daily. 90 tablet 3   levothyroxine (SYNTHROID) 100 MCG tablet TAKE 1 TABLET BY MOUTH IN THE  MORNING 90 tablet 3   Multiple Vitamins-Minerals (MULTIVITAMIN ADULTS 50+) TABS Take 1 tablet by mouth daily.     pioglitazone (ACTOS) 45 MG tablet Take 45 mg by mouth daily.     Risankizumab-rzaa (SKYRIZI PEN) 150 MG/ML SOAJ Inject 150 mg into the skin as directed. Every 12 weeks for maintenance. 1 mL 1   sertraline (ZOLOFT) 50 MG tablet Take 1 tablet (50 mg total) by mouth daily.     No facility-administered medications prior to visit.     Review of Systems:   Constitutional:   No  weight loss, night sweats,  Fevers, chills, fatigue, or  lassitude.  HEENT:   No headaches,  Difficulty swallowing,  Tooth/dental problems, or  Sore throat,                No sneezing, itching, ear ache, nasal congestion, post nasal drip,   CV:  No chest pain,  Orthopnea, PND, swelling in lower extremities, anasarca, dizziness, palpitations, syncope.   GI  No heartburn, indigestion, abdominal pain, nausea, vomiting, diarrhea, change in bowel habits, loss of appetite, bloody stools.   Resp: No shortness of breath with exertion or at rest.  No excess mucus, no productive cough,  No non-productive cough,  No coughing up of blood.  No change in color of mucus.  No wheezing.  No chest wall deformity  Skin: no rash or lesions.  GU: no dysuria, change in color of urine, no urgency or frequency.  No flank pain, no hematuria      Physical Exam  BP (!) 108/58 (BP Location: Left Arm, Patient Position: Sitting, Cuff Size: Normal)   Pulse 78   Temp 98 F  (36.7 C) (Oral)   Ht 5' 3"$  (1.6 m)   Wt 181 lb 3.2 oz (82.2 kg)   SpO2 96%   BMI 32.10 kg/m   GEN: A/Ox3; pleasant , NAD, well nourished    HEENT:  [place]/AT,NOSE-clear, THROAT-clear, no lesions, no postnasal drip or exudate noted.   NECK:  Supple w/ fair ROM; no JVD; normal carotid impulses w/o bruits; no thyromegaly or nodules palpated; no lymphadenopathy.    RESP  Clear  P &  A; w/o, wheezes/ rales/ or rhonchi. no accessory muscle use, no dullness to percussion  CARD:  RRR, no m/r/g, no peripheral edema, pulses intact, no cyanosis or clubbing.  GI:   Soft & nt; nml bowel sounds; no organomegaly or masses detected.   Musco: Warm bil, no deformities or joint swelling noted.   Neuro: alert, no focal deficits noted.    Skin: Warm, no lesions or rashes    Lab Results:  CBC    Component Value Date/Time   WBC 9.7 11/10/2022 1623   RBC 4.28 11/10/2022 1623   HGB 12.2 11/10/2022 1623   HGB 13.5 11/18/2020 1005   HCT 36.9 11/10/2022 1623   HCT 41.0 11/18/2020 1005   PLT 450.0 (H) 11/10/2022 1623   PLT 314 11/18/2020 1005   MCV 86.3 11/10/2022 1623   MCV 86 11/18/2020 1005   MCH 28.0 11/04/2022 1315   MCHC 33.1 11/10/2022 1623   RDW 13.8 11/10/2022 1623   RDW 13.1 11/18/2020 1005   LYMPHSABS 2.4 11/10/2022 1623   LYMPHSABS 2.3 11/18/2020 1005   MONOABS 0.7 11/10/2022 1623   EOSABS 0.2 11/10/2022 1623   EOSABS 0.1 11/18/2020 1005   BASOSABS 0.1 11/10/2022 1623   BASOSABS 0.1 11/18/2020 1005    BMET    Component Value Date/Time   NA 141 11/10/2022 1623   NA 140 11/18/2020 1005   K 4.1 11/10/2022 1623   CL 102 11/10/2022 1623   CO2 27 11/10/2022 1623   GLUCOSE 96 11/10/2022 1623   BUN 17 11/10/2022 1623   BUN 17 11/18/2020 1005   CREATININE 0.57 11/10/2022 1623   CALCIUM 9.6 11/10/2022 1623   GFRNONAA >60 11/04/2022 1247   GFRAA 83 11/18/2020 1005    BNP No results found for: "BNP"  ProBNP No results found for: "PROBNP"  Imaging: DG Chest 2  View  Result Date: 11/04/2022 CLINICAL DATA:  Chest pain EXAM: CHEST - 2 VIEW COMPARISON:  CXR 12/16/16 FINDINGS: No pleural effusion. No pneumothorax. Normal cardiac and mediastinal contours. Streaky opacity in the right mid lung is nonspecific and could represent atelectasis or bronchovascular crowding. Visualized upper abdomen is unremarkable. No radiographically apparent displaced rib fractures. IMPRESSION: No acute abnormality. Electronically Signed   By: Marin Roberts M.D.   On: 11/04/2022 15:38          No data to display          No results found for: "NITRICOXIDE"      Assessment & Plan:   OSA (obstructive sleep apnea) Excellent control compliance on nocturnal CPAP.  We discussed other mask that she could try such as the DreamWear full facemask.  Also talked about things to do for skin issues from CPAP mask.  Patient complains that it leaves marks on her face pursing in the morning that take a while to go away.  We discussed some facial lotion and facial rollers that might help-cosmetic ideas..  Plan  Patient Instructions  Continue on CPAP At bedtime  , wear all night long for at least 6hr or more.  Work on healthy weight loss Do not drive if sleepy Healthy sleep regimen May try Dream wear full face  Can try facial lotion and tools (Roller and gua sha for "CPAP lines" )  Follow-up in 6 months and As needed        [name], NP 11/24/2022

## 2022-11-25 ENCOUNTER — Other Ambulatory Visit: Payer: Self-pay | Admitting: Family Medicine

## 2022-11-25 DIAGNOSIS — Z1231 Encounter for screening mammogram for malignant neoplasm of breast: Secondary | ICD-10-CM

## 2022-12-01 NOTE — Progress Notes (Signed)
Pt had flu like symptoms so covid test was ordered

## 2022-12-04 ENCOUNTER — Other Ambulatory Visit: Payer: Self-pay | Admitting: Acute Care

## 2022-12-04 DIAGNOSIS — Z87891 Personal history of nicotine dependence: Secondary | ICD-10-CM

## 2022-12-04 DIAGNOSIS — F17210 Nicotine dependence, cigarettes, uncomplicated: Secondary | ICD-10-CM

## 2022-12-04 DIAGNOSIS — Z122 Encounter for screening for malignant neoplasm of respiratory organs: Secondary | ICD-10-CM

## 2023-01-19 DIAGNOSIS — E1169 Type 2 diabetes mellitus with other specified complication: Secondary | ICD-10-CM | POA: Diagnosis not present

## 2023-01-19 DIAGNOSIS — M8589 Other specified disorders of bone density and structure, multiple sites: Secondary | ICD-10-CM | POA: Diagnosis not present

## 2023-01-19 DIAGNOSIS — E785 Hyperlipidemia, unspecified: Secondary | ICD-10-CM | POA: Diagnosis not present

## 2023-01-19 DIAGNOSIS — E039 Hypothyroidism, unspecified: Secondary | ICD-10-CM | POA: Diagnosis not present

## 2023-01-19 DIAGNOSIS — E119 Type 2 diabetes mellitus without complications: Secondary | ICD-10-CM | POA: Diagnosis not present

## 2023-01-24 ENCOUNTER — Other Ambulatory Visit: Payer: Medicare Other

## 2023-01-25 ENCOUNTER — Ambulatory Visit
Admission: RE | Admit: 2023-01-25 | Discharge: 2023-01-25 | Disposition: A | Discharge status: Home / Self Care | Payer: Medicare Other | Source: Ambulatory Visit | Attending: Acute Care | Admitting: Acute Care

## 2023-01-25 DIAGNOSIS — F17210 Nicotine dependence, cigarettes, uncomplicated: Secondary | ICD-10-CM

## 2023-01-25 DIAGNOSIS — I2510 Atherosclerotic heart disease of native coronary artery without angina pectoris: Secondary | ICD-10-CM | POA: Diagnosis not present

## 2023-01-25 DIAGNOSIS — J439 Emphysema, unspecified: Secondary | ICD-10-CM | POA: Diagnosis not present

## 2023-01-25 DIAGNOSIS — Z122 Encounter for screening for malignant neoplasm of respiratory organs: Secondary | ICD-10-CM

## 2023-01-25 DIAGNOSIS — Z87891 Personal history of nicotine dependence: Secondary | ICD-10-CM

## 2023-01-25 DIAGNOSIS — E119 Type 2 diabetes mellitus without complications: Secondary | ICD-10-CM | POA: Diagnosis not present

## 2023-01-30 ENCOUNTER — Other Ambulatory Visit: Payer: Self-pay | Admitting: Acute Care

## 2023-01-30 DIAGNOSIS — F17210 Nicotine dependence, cigarettes, uncomplicated: Secondary | ICD-10-CM

## 2023-01-30 DIAGNOSIS — Z87891 Personal history of nicotine dependence: Secondary | ICD-10-CM

## 2023-01-30 DIAGNOSIS — Z122 Encounter for screening for malignant neoplasm of respiratory organs: Secondary | ICD-10-CM

## 2023-02-13 ENCOUNTER — Other Ambulatory Visit: Payer: Self-pay

## 2023-02-13 DIAGNOSIS — E119 Type 2 diabetes mellitus without complications: Secondary | ICD-10-CM

## 2023-02-13 DIAGNOSIS — E039 Hypothyroidism, unspecified: Secondary | ICD-10-CM

## 2023-02-13 MED ORDER — LEVOTHYROXINE SODIUM 100 MCG PO TABS: 100.0000 ug | ORAL_TABLET | Freq: Every morning | ORAL | 3 refills | Status: DC

## 2023-02-13 MED ORDER — ATORVASTATIN CALCIUM 40 MG PO TABS: 40.0000 mg | ORAL_TABLET | Freq: Every day | ORAL | 2 refills | Status: DC

## 2023-02-15 ENCOUNTER — Telehealth (INDEPENDENT_AMBULATORY_CARE_PROVIDER_SITE_OTHER): Payer: Medicare Other | Admitting: Internal Medicine

## 2023-02-15 ENCOUNTER — Encounter: Payer: Self-pay | Admitting: Internal Medicine

## 2023-02-15 VITALS — Temp 98.2°F | Ht 63.0 in | Wt 175.0 lb

## 2023-02-15 DIAGNOSIS — L255 Unspecified contact dermatitis due to plants, except food: Secondary | ICD-10-CM | POA: Diagnosis not present

## 2023-02-15 MED ORDER — TRIAMCINOLONE ACETONIDE 0.1 % EX CREA: 1.0000 | TOPICAL_CREAM | Freq: Two times a day (BID) | CUTANEOUS | 1 refills | Status: DC | PRN

## 2023-02-15 MED ORDER — PREDNISONE 20 MG PO TABS
40.0000 mg | ORAL_TABLET | Freq: Every day | ORAL | 0 refills | Status: DC
Start: 2023-02-15 — End: 2023-05-25

## 2023-02-15 NOTE — Progress Notes (Signed)
Subjective:    Patient ID: Kimberly Hartman, female    DOB: 12-02-1950, 72 y.o.   MRN: [id number]  HPI Video virtual visit due to poison ivy or other plant Identification done Reviewed limitations and billing and she gave consent Participants---patient in her home and I am in my office  Was cleaning out yard/garden 3 days ago Wearing shorts--had gloves on (already washed them) That night she saw bumps on her legs--thought they were bug bites Very itchy--so started cortisone cream----slight help 2 nights ago---blistering and weeping started Has worsened since then---now on both legs  Tried calamine last night  Current Outpatient Medications on File Prior to Visit  Medication Sig Dispense Refill   acetaminophen (TYLENOL) 325 MG tablet Take 650 mg by mouth every 6 (six) hours as needed.     atorvastatin (LIPITOR) 40 MG tablet Take 1 tablet (40 mg total) by mouth daily. 90 tablet 2   Calcium Carbonate-Vitamin D (CALCIUM-D) 600-400 MG-UNIT TABS Take 2 tablets by mouth daily.     cetirizine (ZYRTEC) 10 MG tablet Take 10 mg by mouth daily as needed for allergies.     Cholecalciferol (VITAMIN D3) 1000 units CAPS Take 1 capsule by mouth daily.     diphenhydrAMINE (BENADRYL) 25 MG tablet Take 25 mg by mouth daily as needed.     glucose blood (ONE TOUCH ULTRA TEST) test strip Use to check blood sugar once a day. 50 each 3   hydrocortisone 2.5 % cream Apply topically 2 (two) times daily. Apply to rash between the breast BID prn x up to 2 weeks. 28 g 0   JANUVIA 100 MG tablet Take 1 tablet (100 mg total) by mouth daily. 90 tablet 3   levothyroxine (SYNTHROID) 100 MCG tablet Take 1 tablet (100 mcg total) by mouth every morning. 90 tablet 3   Multiple Vitamins-Minerals (MULTIVITAMIN ADULTS 50+) TABS Take 1 tablet by mouth daily.     pioglitazone (ACTOS) 45 MG tablet Take 45 mg by mouth daily.     Risankizumab-rzaa (SKYRIZI PEN) 150 MG/ML SOAJ Inject 150 mg into the skin as directed. Every 12 weeks for  maintenance. 1 mL 1   Semaglutide,0.25 or 0.5MG /DOS, (OZEMPIC, 0.25 OR 0.5 MG/DOSE,) 2 MG/1.5ML SOPN Inject into the skin. As directed     sertraline (ZOLOFT) 50 MG tablet Take 1 tablet (50 mg total) by mouth daily.     No current facility-administered medications on file prior to visit.    Allergies  Allergen Reactions   Chantix [Varenicline Tartrate]    Glimepiride     hypoglycemia   Metformin And Related    Reclast [Zoledronic Acid]     Aches, joint pain   Neo-Bacit-Poly-Lidocaine Itching, Swelling, Dermatitis and Rash    She can tolerate bacitracin.     Penicillins Rash    TOLERATED KEFLEX WITH NO ISSUES   Tape Dermatitis and Rash    THE ADHESIVE PART NOT THE ACTUAL TAPE   Tetracyclines & Related Rash   Wellbutrin [Bupropion] Rash    Past Medical History:  Diagnosis Date   Allergy    Anxiety    Arthritis    Cataract    removed both , lens implants   COPD (chronic obstructive pulmonary disease) (H[facility])    Diabetes mellitus without complication ([facility])    Psoriasis    Thyroid disease     Past Surgical History:  Procedure Laterality Date   ABDOMINAL HYSTERECTOMY  2001   ABDOMINAL SURGERY     ADENOIDECTOMY  1956  APPENDECTOMY  1967   BREAST EXCISIONAL BIOPSY Right    benign   COLONOSCOPY     DENTAL SURGERY     EYE SURGERY  2018   Cataract   KNEE ARTHROSCOPY WITH MENISCAL REPAIR Right    x2   POLYPECTOMY      Family History  Problem Relation Age of Onset   Lung cancer Mother    Esophageal cancer Father    Stroke Maternal Grandmother    Heart disease Maternal Grandmother    Colon cancer Neg Hx    Rectal cancer Neg Hx    Stomach cancer Neg Hx    Colon polyps Neg Hx    Breast cancer Neg Hx     Social History   Socioeconomic History   Marital status: Married    Spouse name: [name]   Number of children: 0   Years of education: bachelors   Highest education level: Not on file  Occupational History   Not on file  Tobacco Use   Smoking status: Every Day     Packs/day: 1.00    Years: 50.00    Additional pack years: 0.00    Total pack years: 50.00    Types: Cigarettes   Smokeless tobacco: Never   Tobacco comments:    Smoking about 1/2 ppd.  Not trying to quit.  11/24/2022 hfb  Vaping Use   Vaping Use: Never used  Substance and Sexual Activity   Alcohol use: Yes    Comment: wine a few times a week   Drug use: Never   Sexual activity: Not Currently    Birth control/protection: Surgical  Other Topics Concern   Not on file  Social History Narrative   11/12/20   From: [place] area originally, moved to [facility] 2013 to be near brother   Living: with husband, Elijah Birk (2018)   Dog [name].        College at [employer]      Family: 2 step children - ok relationship and step grandchildren, brother in [place]      Exercise: walking the dog   Diet: diabetic diet - tries to avoid carbs and sugar      Safety   Seat belts: Yes    Guns: No   Safe in relationships: Yes    Social Determinants of Health   Financial Resource Strain: Low Risk  (11/09/2022)   Overall Financial Resource Strain (CARDIA)    Difficulty of Paying Living Expenses: Not hard at all  Food Insecurity: No Food Insecurity (11/09/2022)   Hunger Vital Sign    Worried About Running Out of Food in the Last Year: Never true    Ran Out of Food in the Last Year: Never true  Transportation Needs: No Transportation Needs (11/09/2022)   PRAPARE - Administrator, Civil Service (Medical): No    Lack of Transportation (Non-Medical): No  Physical Activity: Insufficiently Active (11/09/2022)   Exercise Vital Sign    Days of Exercise per Week: 4 days    Minutes of Exercise per Session: 20 min  Stress: No Stress Concern Present (11/09/2022)   [name] of Occupational Health - Occupational Stress Questionnaire    Feeling of Stress : Not at all  Social Connections: Moderately Isolated (11/09/2022)   Social Connection and Isolation Panel [NHANES]    Frequency of  Communication with Friends and Family: More than three times a week    Frequency of Social Gatherings with Friends and Family: Twice a week  Attends Religious Services: Never    Active Member of Clubs or Organizations: Patient declined    Attends Banker Meetings: Never    Marital Status: Married  Catering manager Violence: Not At Risk (11/09/2022)   Humiliation, Afraid, Rape, and Kick questionnaire    Fear of Current or Ex-Partner: No    Emotionally Abused: No    Physically Abused: No    Sexually Abused: No  Review of Systems No fever Doesn't feel sick    Objective:   Physical Exam Constitutional:      Appearance: Normal appearance.  Skin:    Comments: Scattered papulovesicular rash on both lower legs  Neurological:     Mental Status: She is alert.            Assessment & Plan:

## 2023-02-15 NOTE — Assessment & Plan Note (Signed)
Clear contact derm Has done okay with prednisone in the past---will give 40mg  x 3 then 20mg  x 3 Increase cetirizine to 10 bid for now Okay benedryl just at night Rx for triamcinolone as well

## 2023-02-16 ENCOUNTER — Ambulatory Visit (INDEPENDENT_AMBULATORY_CARE_PROVIDER_SITE_OTHER): Payer: Medicare Other | Admitting: Dermatology

## 2023-02-16 VITALS — BP 127/77 | HR 63

## 2023-02-16 DIAGNOSIS — L255 Unspecified contact dermatitis due to plants, except food: Secondary | ICD-10-CM

## 2023-02-16 DIAGNOSIS — L409 Psoriasis, unspecified: Secondary | ICD-10-CM

## 2023-02-16 DIAGNOSIS — L400 Psoriasis vulgaris: Secondary | ICD-10-CM

## 2023-02-16 DIAGNOSIS — L239 Allergic contact dermatitis, unspecified cause: Secondary | ICD-10-CM

## 2023-02-16 NOTE — Progress Notes (Signed)
   Follow-Up Visit   Subjective  Kimberly Hartman is a 71 y.o. female who presents for the following: Psoriasis - pt has been on Skyrizi since November and was doing well, but her feet have started to flare. Pt states that joint pain hasn't improved and she would like to discuss possibly going back on Cosentyx. She has betamethasone and clobetasol at home, but hasn't used them.   The following portions of the chart were reviewed this encounter and updated as appropriate: medications, allergies, medical history  Review of Systems:  No other skin or systemic complaints except as noted in HPI or Assessment and Plan.  Objective  Well appearing patient in no apparent distress; mood and affect are within normal limits.  Areas Examined: The face, hands, and feet.   Relevant exam findings are noted in the Assessment and Plan.    Assessment & Plan     PSORIASIS R palm, R lat feet - Well-demarcated erythematous papules/plaques with silvery scale, guttate pink scaly papules. 6% BSA.  Chronic and persistent condition with duration or expected duration over one year. Condition is bothersome/symptomatic for patient. Currently flared.  Pt c/o joint pain and states rheumatologist states not PsA.  Treatment Plan:  Counseling on psoriasis and coordination of care  psoriasis is a chronic non-curable, but treatable genetic/hereditary disease that may have other systemic features affecting other organ systems such as joints (Psoriatic Arthritis). It is associated with an increased risk of inflammatory bowel disease, heart disease, non-alcoholic fatty liver disease, and depression.  Treatments include light and laser treatments; topical medications; and systemic medications including oral and injectables.  D/C Skyrizi since patient still having joint pain (thought to not be PsA) and psoriasis is flaring on the feet.   Discussed going back on Cosentyx vs starting Tremfya. Patient prefers restarting  Cosentyx because it worked to clear her psoriasis in the past.   Cosentyx Sensoready Pen 150mg /mL x 2 and 4 boxes of Cosentyx UnoReady pen 300mg /71mL gave to patient. Verbal TB screening performed today. Gave patient Cosentyx patient assistance program application today. Patient to start Cosentyx when she is due for her Skyrizi injection next week.   ALLERGIC CONTACT DERMATITIS Exam: scaly pink papules and/or plaques  Likely secondary to plant  Treatment Plan: Continue Prednisone as prescribed by PCP, patient has Clobetasol and Betamethasone at home. May use that BID until clear since those are stronger than TMC. Advised sometimes this can worsen again after stopping prednisone   Return in about 6 months (around 08/19/2023) for TBSE and psoriasis.  Maylene Roes, CMA, am acting as scribe for Darden Dates, MD .   Documentation: I have reviewed the above documentation for accuracy and completeness, and I agree with the above.  Darden Dates, MD

## 2023-02-16 NOTE — Patient Instructions (Signed)
Due to recent changes in healthcare laws, you may see results of your pathology and/or laboratory studies on MyChart before the doctors have had a chance to review them. We understand that in some cases there may be results that are confusing or concerning to you. Please understand that not all results are received at the same time and often the doctors may need to interpret multiple results in order to provide you with the best plan of care or course of treatment. Therefore, we ask that you please give us 2 business days to thoroughly review all your results before contacting the office for clarification. Should we see a critical lab result, you will be contacted sooner.   If You Need Anything After Your Visit  If you have any questions or concerns for your doctor, please call our main line at [phone] and press option 4 to reach your doctor's medical assistant. If no one answers, please leave a voicemail as directed and we will return your call as soon as possible. Messages left after 4 pm will be answered the following business day.   You may also send us a message via MyChart. We typically respond to MyChart messages within 1-2 business days.  For prescription refills, please ask your pharmacy to contact our office. Our fax number is [phone].  If you have an urgent issue when the clinic is closed that cannot wait until the next business day, you can page your doctor at the number below.    Please note that while we do our best to be available for urgent issues outside of office hours, we are not available 24/7.   If you have an urgent issue and are unable to reach us, you may choose to seek medical care at your doctor's office, retail clinic, urgent care center, or emergency room.  If you have a medical emergency, please immediately call 911 or go to the emergency department.  Pager Numbers  - Dr. Kowalski: [phone]  - Dr. Moye: [phone]  - Dr. Stewart:  [phone]  In the event of inclement weather, please call our main line at [phone] for an update on the status of any delays or closures.  Dermatology Medication Tips: Please keep the boxes that topical medications come in in order to help keep track of the instructions about where and how to use these. Pharmacies typically print the medication instructions only on the boxes and not directly on the medication tubes.   If your medication is too expensive, please contact our office at [phone] option 4 or send us a message through MyChart.   We are unable to tell what your co-pay for medications will be in advance as this is different depending on your insurance coverage. However, we may be able to find a substitute medication at lower cost or fill out paperwork to get insurance to cover a needed medication.   If a prior authorization is required to get your medication covered by your insurance company, please allow us 1-2 business days to complete this process.  Drug prices often vary depending on where the prescription is filled and some pharmacies may offer cheaper prices.  The website www.goodrx.com contains coupons for medications through different pharmacies. The prices here do not account for what the cost may be with help from insurance (it may be cheaper with your insurance), but the website can give you the price if you did not use any insurance.  - You can print the associated coupon and take it with   your prescription to the pharmacy.  - You may also stop by our office during regular business hours and pick up a GoodRx coupon card.  - If you need your prescription sent electronically to a different pharmacy, notify our office through [facility] MyChart or by phone at [phone] option 4.     Si Usted Necesita Algo Despus de Su Visita  Tambin puede enviarnos un mensaje a travs de MyChart. Por lo general respondemos a los mensajes de MyChart en el transcurso de 1 a 2  das hbiles.  Para renovar recetas, por favor pida a su farmacia que se ponga en contacto con nuestra oficina. Nuestro nmero de fax es el [phone].  Si tiene un asunto urgente cuando la clnica est cerrada y que no puede esperar hasta el siguiente da hbil, puede llamar/localizar a su doctor(a) al nmero que aparece a continuacin.   Por favor, tenga en cuenta que aunque hacemos todo lo posible para estar disponibles para asuntos urgentes fuera del horario de oficina, no estamos disponibles las 24 horas del da, los 7 das de la semana.   Si tiene un problema urgente y no puede comunicarse con nosotros, puede optar por buscar atencin mdica  en el consultorio de su doctor(a), en una clnica privada, en un centro de atencin urgente o en una sala de emergencias.  Si tiene una emergencia mdica, por favor llame inmediatamente al 911 o vaya a la sala de emergencias.  Nmeros de bper  - Dr. Kowalski: [phone]  - Dra. Moye: [phone]  - Dra. Stewart: [phone]  En caso de inclemencias del tiempo, por favor llame a nuestra lnea principal al [phone] para una actualizacin sobre el estado de cualquier retraso o cierre.  Consejos para la medicacin en dermatologa: Por favor, guarde las cajas en las que vienen los medicamentos de uso tpico para ayudarle a seguir las instrucciones sobre dnde y cmo usarlos. Las farmacias generalmente imprimen las instrucciones del medicamento slo en las cajas y no directamente en los tubos del medicamento.   Si su medicamento es muy caro, por favor, pngase en contacto con nuestra oficina llamando al [phone] y presione la opcin 4 o envenos un mensaje a travs de MyChart.   No podemos decirle cul ser su copago por los medicamentos por adelantado ya que esto es diferente dependiendo de la cobertura de su seguro. Sin embargo, es posible que podamos encontrar un medicamento sustituto a menor costo o llenar un formulario para que el  seguro cubra el medicamento que se considera necesario.   Si se requiere una autorizacin previa para que su compaa de seguros cubra su medicamento, por favor permtanos de 1 a 2 das hbiles para completar este proceso.  Los precios de los medicamentos varan con frecuencia dependiendo del lugar de dnde se surte la receta y alguna farmacias pueden ofrecer precios ms baratos.  El sitio web www.goodrx.com tiene cupones para medicamentos de diferentes farmacias. Los precios aqu no tienen en cuenta lo que podra costar con la ayuda del seguro (puede ser ms barato con su seguro), pero el sitio web puede darle el precio si no utiliz ningn seguro.  - Puede imprimir el cupn correspondiente y llevarlo con su receta a la farmacia.  - Tambin puede pasar por nuestra oficina durante el horario de atencin regular y recoger una tarjeta de cupones de GoodRx.  - Si necesita que su receta se enve electrnicamente a una farmacia diferente, informe a nuestra oficina a travs de MyChart de    o por telfono llamando al [phone] y presione la opcin 4.  

## 2023-02-21 ENCOUNTER — Other Ambulatory Visit: Payer: Self-pay

## 2023-02-21 DIAGNOSIS — F321 Major depressive disorder, single episode, moderate: Secondary | ICD-10-CM

## 2023-02-21 MED ORDER — SERTRALINE HCL 50 MG PO TABS
50.0000 mg | ORAL_TABLET | Freq: Every day | ORAL | 1 refills | Status: DC
Start: 2023-02-21 — End: 2023-08-03

## 2023-03-08 ENCOUNTER — Ambulatory Visit
Admission: RE | Admit: 2023-03-08 | Discharge: 2023-03-08 | Disposition: A | Discharge status: Home / Self Care | Payer: Medicare Other | Source: Ambulatory Visit | Attending: Family Medicine | Admitting: Family Medicine

## 2023-03-08 DIAGNOSIS — Z1231 Encounter for screening mammogram for malignant neoplasm of breast: Secondary | ICD-10-CM

## 2023-03-09 ENCOUNTER — Ambulatory Visit: Payer: Medicare Other | Admitting: Dermatology

## 2023-04-18 ENCOUNTER — Telehealth: Payer: Self-pay

## 2023-04-18 NOTE — Telephone Encounter (Signed)
Patient called requesting samples of Cosentyx, patient will come here to pick up samples next week

## 2023-04-20 DIAGNOSIS — H52203 Unspecified astigmatism, bilateral: Secondary | ICD-10-CM | POA: Diagnosis not present

## 2023-04-20 DIAGNOSIS — Z961 Presence of intraocular lens: Secondary | ICD-10-CM | POA: Diagnosis not present

## 2023-04-20 DIAGNOSIS — H40013 Open angle with borderline findings, low risk, bilateral: Secondary | ICD-10-CM | POA: Diagnosis not present

## 2023-04-20 DIAGNOSIS — E119 Type 2 diabetes mellitus without complications: Secondary | ICD-10-CM | POA: Diagnosis not present

## 2023-04-20 LAB — HM DIABETES EYE EXAM

## 2023-04-24 ENCOUNTER — Telehealth: Payer: Self-pay

## 2023-04-24 NOTE — Telephone Encounter (Signed)
Patient came in the office today and picked up two samples of UnoReady Cosentyx.  LOT: [id number] EXP: 05/2024 LOT: [id number] EXP: 08/2024

## 2023-05-18 DIAGNOSIS — E1169 Type 2 diabetes mellitus with other specified complication: Secondary | ICD-10-CM | POA: Diagnosis not present

## 2023-05-18 DIAGNOSIS — E039 Hypothyroidism, unspecified: Secondary | ICD-10-CM | POA: Diagnosis not present

## 2023-05-18 DIAGNOSIS — E785 Hyperlipidemia, unspecified: Secondary | ICD-10-CM | POA: Diagnosis not present

## 2023-05-18 DIAGNOSIS — E119 Type 2 diabetes mellitus without complications: Secondary | ICD-10-CM | POA: Diagnosis not present

## 2023-05-25 ENCOUNTER — Encounter: Payer: Self-pay | Admitting: Adult Health

## 2023-05-25 ENCOUNTER — Ambulatory Visit (INDEPENDENT_AMBULATORY_CARE_PROVIDER_SITE_OTHER): Payer: Medicare Other | Admitting: Adult Health

## 2023-05-25 VITALS — BP 104/60 | HR 69 | Ht 63.0 in | Wt 174.0 lb

## 2023-05-25 DIAGNOSIS — G4733 Obstructive sleep apnea (adult) (pediatric): Secondary | ICD-10-CM | POA: Diagnosis not present

## 2023-05-25 DIAGNOSIS — F17200 Nicotine dependence, unspecified, uncomplicated: Secondary | ICD-10-CM | POA: Diagnosis not present

## 2023-05-25 NOTE — Assessment & Plan Note (Signed)
Excellent control and compliance on CPAP. Loves dreamwear full face.   Plan Patient Instructions  Continue on CPAP At bedtime  , wear all night long for at least 6hr or more.  Work on healthy weight loss Do not drive if sleepy Healthy sleep regimen Follow-up in 1 year and As needed

## 2023-05-25 NOTE — Progress Notes (Signed)
@Patient  ID: Kimberly Hartman, female    DOB: June 30, 1951, 72 y.o.   MRN: [id number]  Chief Complaint  Patient presents with   Follow-up    Referring provider: Joaquim Nam, MD  HPI: 72 year old female smoker seen for sleep consult July 21, 2022 for snoring found to have mild sleep apnea Participates in the Lung Cancer CT chest screening program.   TEST/EVENTS :  home sleep study that was completed on September 06, 2022 that showed mild sleep apnea with AHI at 12.9/hour and SpO2 low at 81%.     05/25/2023 Follow up: OSA  Patient presents for a 70-month follow-up.  The patient was found to have mild obstructive sleep apnea on home sleep study.  She was started on CPAP therapy.  Patient says since last visit she is trying to wear her CPAP each night.  Does feel that she has decreased daytime sleepiness.  She loves the new mask with dreamwear fullface mask.  CPAP download shows excellent compliance with daily average usage at 7.5 hours.  She is on auto CPAP 5 to 15 cm H2O.  AHI is at 2.2/hr  and daily average pressure around 12 cm H2O. Snoring has resolved totally.   Allergies  Allergen Reactions   Chantix [Varenicline Tartrate]    Glimepiride     hypoglycemia   Metformin And Related    Reclast [Zoledronic Acid]     Aches, joint pain   Neo-Bacit-Poly-Lidocaine Itching, Swelling, Dermatitis and Rash    She can tolerate bacitracin.     Penicillins Rash    TOLERATED KEFLEX WITH NO ISSUES   Tape Dermatitis and Rash    THE ADHESIVE PART NOT THE ACTUAL TAPE   Tetracyclines & Related Rash   Wellbutrin [Bupropion] Rash    Immunization History  Administered Date(s) Administered   COVID-19, mRNA, vaccine(Comirnaty)12 years and older 08/17/2022   Fluad Quad(high Dose 65+) 08/22/2020   Influenza, High Dose Seasonal PF 07/12/2019, 08/03/2022   Influenza,inj,Quad PF,6+ Mos 09/13/2018   Influenza-Unspecified 09/13/2018, 07/07/2021   PFIZER(Purple Top)SARS-COV-2 Vaccination 11/09/2019,  11/30/2019, 08/22/2020, 01/30/2021   PPD Test 06/17/2018   Pfizer Covid-19 Vaccine Bivalent Booster 43yrs & up 09/11/2021   Pneumococcal Conjugate-13 10/19/2015   Pneumococcal Polysaccharide-23 03/27/2013, 11/17/2021   Tdap 09/12/2012   Zoster Recombinant(Shingrix) 10/18/2017, 06/17/2018    Past Medical History:  Diagnosis Date   Allergy    Anxiety    Arthritis    Cataract    removed both , lens implants   COPD (chronic obstructive pulmonary disease) (HCC)    Diabetes mellitus without complication (HCC)    Psoriasis    Thyroid disease     Tobacco History: Social History   Tobacco Use  Smoking Status Every Day   Current packs/day: 1.00   Average packs/day: 1 pack/day for 50.0 years (50.0 ttl pk-yrs)   Types: Cigarettes  Smokeless Tobacco Never  Tobacco Comments   Smoking about 1/2 ppd.  Not trying to quit.  11/24/2022 hfb   Ready to quit: Not Answered Counseling given: Not Answered Tobacco comments: Smoking about 1/2 ppd.  Not trying to quit.  11/24/2022 hfb   Outpatient Medications Prior to Visit  Medication Sig Dispense Refill   Secukinumab (COSENTYX SENSOREADY, 300 MG, [facility]) Inject 300 mg into the skin.     acetaminophen (TYLENOL) 325 MG tablet Take 650 mg by mouth every 6 (six) hours as needed.     atorvastatin (LIPITOR) 40 MG tablet Take 1 tablet (40 mg total) by mouth daily. 90 tablet  2   Calcium Carbonate-Vitamin D (CALCIUM-D) 600-400 MG-UNIT TABS Take 2 tablets by mouth daily.     cetirizine (ZYRTEC) 10 MG tablet Take 10 mg by mouth daily as needed for allergies.     Cholecalciferol (VITAMIN D3) 1000 units CAPS Take 1 capsule by mouth daily.     diphenhydrAMINE (BENADRYL) 25 MG tablet Take 25 mg by mouth daily as needed.     glucose blood (ONE TOUCH ULTRA TEST) test strip Use to check blood sugar once a day. 50 each 3   hydrocortisone 2.5 % cream Apply topically 2 (two) times daily. Apply to rash between the breast BID prn x up to 2 weeks. 28 g 0   levothyroxine  (SYNTHROID) 100 MCG tablet Take 1 tablet (100 mcg total) by mouth every morning. 90 tablet 3   Multiple Vitamins-Minerals (MULTIVITAMIN ADULTS 50+) TABS Take 1 tablet by mouth daily.     pioglitazone (ACTOS) 45 MG tablet Take 45 mg by mouth daily.     Semaglutide,0.25 or 0.5MG /DOS, (OZEMPIC, 0.25 OR 0.5 MG/DOSE,) 2 MG/1.5ML SOPN Inject into the skin. As directed     sertraline (ZOLOFT) 50 MG tablet Take 1 tablet (50 mg total) by mouth daily. 90 tablet 1   triamcinolone cream (KENALOG) 0.1 % Apply 1 Application topically 2 (two) times daily as needed. 45 g 1   JANUVIA 100 MG tablet Take 1 tablet (100 mg total) by mouth daily. 90 tablet 3   predniSONE (DELTASONE) 20 MG tablet Take 2 tablets (40 mg total) by mouth daily. For 3 days, then 1 tab daily for 3 days 9 tablet 0   Risankizumab-rzaa (SKYRIZI PEN) 150 MG/ML SOAJ Inject 150 mg into the skin as directed. Every 12 weeks for maintenance. 1 mL 1   No facility-administered medications prior to visit.     Review of Systems:   Constitutional:   No  weight loss, night sweats,  Fevers, chills, + fatigue, or  lassitude.  HEENT:   No headaches,  Difficulty swallowing,  Tooth/dental problems, or  Sore throat,                No sneezing, itching, ear ache, nasal congestion, post nasal drip,   CV:  No chest pain,  Orthopnea, PND, swelling in lower extremities, anasarca, dizziness, palpitations, syncope.   GI  No heartburn, indigestion, abdominal pain, nausea, vomiting, diarrhea, change in bowel habits, loss of appetite, bloody stools.   Resp: No shortness of breath with exertion or at rest.  No excess mucus, no productive cough,  No non-productive cough,  No coughing up of blood.  No change in color of mucus.  No wheezing.  No chest wall deformity  Skin: no rash or lesions.  GU: no dysuria, change in color of urine, no urgency or frequency.  No flank pain, no hematuria   MS:  No joint pain or swelling.  No decreased range of motion.  No back  pain.    Physical Exam  There were no vitals taken for this visit.  GEN: A/Ox3; pleasant , NAD, well nourished    HEENT:  [place]/AT,  EACs-clear, TMs-wnl, NOSE-clear, THROAT-clear, no lesions, no postnasal drip or exudate noted.   NECK:  Supple w/ fair ROM; no JVD; normal carotid impulses w/o bruits; no thyromegaly or nodules palpated; no lymphadenopathy.    RESP  Clear  P & A; w/o, wheezes/ rales/ or rhonchi. no accessory muscle use, no dullness to percussion  CARD:  RRR, no m/r/g, no peripheral edema,  pulses intact, no cyanosis or clubbing.  GI:   Soft & nt; nml bowel sounds; no organomegaly or masses detected.   Musco: Warm bil, no deformities or joint swelling noted.   Neuro: alert, no focal deficits noted.    Skin: Warm, no lesions or rashes    Lab Results:      BNP No results found for: "BNP"  ProBNP No results found for: "PROBNP"  Imaging: No results found.  Administration History     None           No data to display          No results found for: "NITRICOXIDE"      Assessment & Plan:   OSA (obstructive sleep apnea) Excellent control and compliance on CPAP. Loves dreamwear full face.   Plan Patient Instructions  Continue on CPAP At bedtime  , wear all night long for at least 6hr or more.  Work on healthy weight loss Do not drive if sleepy Healthy sleep regimen Follow-up in 1 year and As needed       Smoker Smoking cessation  Continue with LDCT screening      Rubye Oaks, NP 05/25/2023

## 2023-05-25 NOTE — Assessment & Plan Note (Signed)
Smoking cessation  Continue with LDCT screening

## 2023-05-25 NOTE — Patient Instructions (Addendum)
Continue on CPAP At bedtime  , wear all night long for at least 6hr or more.  Work on healthy weight loss Do not drive if sleepy Healthy sleep regimen Follow-up in 1 year and As needed

## 2023-05-26 NOTE — Addendum Note (Signed)
Addended by: Delrae Rend on: 05/26/2023 09:07 AM   Modules accepted: Orders

## 2023-06-15 ENCOUNTER — Telehealth: Payer: Self-pay

## 2023-06-15 NOTE — Telephone Encounter (Signed)
Patient came into office and picked up two samples of Cosentyx Unoready Pens.  LOT: [id number] EXP: 11/30/24  Dorathy Daft, RMA

## 2023-07-22 DIAGNOSIS — Z23 Encounter for immunization: Secondary | ICD-10-CM | POA: Diagnosis not present

## 2023-07-28 DIAGNOSIS — M65332 Trigger finger, left middle finger: Secondary | ICD-10-CM | POA: Diagnosis not present

## 2023-07-28 DIAGNOSIS — M65322 Trigger finger, left index finger: Secondary | ICD-10-CM | POA: Diagnosis not present

## 2023-08-02 ENCOUNTER — Telehealth: Payer: Self-pay | Admitting: Family Medicine

## 2023-08-02 NOTE — Telephone Encounter (Signed)
Kimberly Hartman from [facility] called in and stated that the manufacturer for levothyroxine (SYNTHROID) 100 MCG tablet is changing. She is needing an ok to refill. Reference number is [id number]. Thank you!

## 2023-08-03 ENCOUNTER — Other Ambulatory Visit: Payer: Self-pay | Admitting: Family Medicine

## 2023-08-03 DIAGNOSIS — F321 Major depressive disorder, single episode, moderate: Secondary | ICD-10-CM

## 2023-08-03 NOTE — Telephone Encounter (Signed)
LOV: 11/18/2022 NEXT VISIT: 11/23/2023 LAST REFILL: 02/13/2023 synthroid

## 2023-08-03 NOTE — Telephone Encounter (Signed)
Called pharmacy approval given. Will call back if any further questions.

## 2023-08-03 NOTE — Telephone Encounter (Signed)
Please give the order- no change in sig.  Please make sure patient is aware.  We can recheck her labs in February 2025.  Thanks.

## 2023-08-08 ENCOUNTER — Other Ambulatory Visit: Payer: Self-pay

## 2023-08-08 ENCOUNTER — Encounter (HOSPITAL_BASED_OUTPATIENT_CLINIC_OR_DEPARTMENT_OTHER): Payer: Self-pay | Admitting: Orthopedic Surgery

## 2023-08-10 ENCOUNTER — Telehealth: Payer: Self-pay

## 2023-08-10 NOTE — Telephone Encounter (Signed)
Sample given of Cosentyx Uno Ready  LOT: [id number] EXP: 11/30/2024

## 2023-08-16 ENCOUNTER — Ambulatory Visit (HOSPITAL_BASED_OUTPATIENT_CLINIC_OR_DEPARTMENT_OTHER): Payer: Medicare Other | Admitting: Anesthesiology

## 2023-08-16 ENCOUNTER — Ambulatory Visit (HOSPITAL_BASED_OUTPATIENT_CLINIC_OR_DEPARTMENT_OTHER): Payer: Self-pay | Admitting: Anesthesiology

## 2023-08-16 ENCOUNTER — Encounter (HOSPITAL_BASED_OUTPATIENT_CLINIC_OR_DEPARTMENT_OTHER)
Admission: RE | Disposition: A | Discharge status: Home / Self Care | Payer: Self-pay | Source: Home / Self Care | Attending: Orthopedic Surgery

## 2023-08-16 ENCOUNTER — Encounter (HOSPITAL_BASED_OUTPATIENT_CLINIC_OR_DEPARTMENT_OTHER): Payer: Self-pay | Admitting: Orthopedic Surgery

## 2023-08-16 ENCOUNTER — Ambulatory Visit (HOSPITAL_BASED_OUTPATIENT_CLINIC_OR_DEPARTMENT_OTHER)
Admission: RE | Admit: 2023-08-16 | Discharge: 2023-08-16 | Disposition: A | Discharge status: Home / Self Care | Payer: Medicare Other | Attending: Orthopedic Surgery | Admitting: Orthopedic Surgery

## 2023-08-16 DIAGNOSIS — M65842 Other synovitis and tenosynovitis, left hand: Secondary | ICD-10-CM | POA: Diagnosis not present

## 2023-08-16 DIAGNOSIS — M65332 Trigger finger, left middle finger: Secondary | ICD-10-CM

## 2023-08-16 DIAGNOSIS — Z7985 Long-term (current) use of injectable non-insulin antidiabetic drugs: Secondary | ICD-10-CM | POA: Diagnosis not present

## 2023-08-16 DIAGNOSIS — F17210 Nicotine dependence, cigarettes, uncomplicated: Secondary | ICD-10-CM | POA: Insufficient documentation

## 2023-08-16 DIAGNOSIS — E785 Hyperlipidemia, unspecified: Secondary | ICD-10-CM | POA: Insufficient documentation

## 2023-08-16 DIAGNOSIS — M65322 Trigger finger, left index finger: Secondary | ICD-10-CM | POA: Insufficient documentation

## 2023-08-16 DIAGNOSIS — F419 Anxiety disorder, unspecified: Secondary | ICD-10-CM | POA: Insufficient documentation

## 2023-08-16 DIAGNOSIS — Z7984 Long term (current) use of oral hypoglycemic drugs: Secondary | ICD-10-CM | POA: Diagnosis not present

## 2023-08-16 DIAGNOSIS — J449 Chronic obstructive pulmonary disease, unspecified: Secondary | ICD-10-CM | POA: Diagnosis not present

## 2023-08-16 DIAGNOSIS — E119 Type 2 diabetes mellitus without complications: Secondary | ICD-10-CM | POA: Insufficient documentation

## 2023-08-16 DIAGNOSIS — E039 Hypothyroidism, unspecified: Secondary | ICD-10-CM | POA: Diagnosis not present

## 2023-08-16 DIAGNOSIS — F32A Depression, unspecified: Secondary | ICD-10-CM | POA: Insufficient documentation

## 2023-08-16 DIAGNOSIS — E118 Type 2 diabetes mellitus with unspecified complications: Secondary | ICD-10-CM

## 2023-08-16 HISTORY — PX: TRIGGER FINGER RELEASE: SHX641

## 2023-08-16 LAB — GLUCOSE, CAPILLARY
Glucose-Capillary: 127 mg/dL — ABNORMAL HIGH (ref 70–99)
Glucose-Capillary: 97 mg/dL (ref 70–99)

## 2023-08-16 LAB — BASIC METABOLIC PANEL
Anion gap: 7 (ref 5–15)
BUN: 22 mg/dL (ref 8–23)
CO2: 27 mmol/L (ref 22–32)
Calcium: 9 mg/dL (ref 8.9–10.3)
Chloride: 104 mmol/L (ref 98–111)
Creatinine, Ser: 1.04 mg/dL — ABNORMAL HIGH (ref 0.44–1.00)
GFR, Estimated: 57 mL/min — ABNORMAL LOW (ref >60)
Glucose, Bld: 128 mg/dL — ABNORMAL HIGH (ref 70–99)
Potassium: 4.5 mmol/L (ref 3.5–5.1)
Sodium: 138 mmol/L (ref 135–145)

## 2023-08-16 SURGERY — RELEASE, A1 PULLEY, FOR TRIGGER FINGER
Anesthesia: Monitor Anesthesia Care | Laterality: Left

## 2023-08-16 MED ORDER — OXYCODONE HCL 5 MG PO TABS: 5.0000 mg | ORAL_TABLET | Freq: Once | ORAL | Status: DC | PRN

## 2023-08-16 MED ORDER — SODIUM CHLORIDE 0.9 % IV SOLN: INTRAVENOUS | Status: DC | PRN

## 2023-08-16 MED ORDER — ONDANSETRON HCL 4 MG/2ML IJ SOLN
INTRAMUSCULAR | Status: DC | PRN
  Administered 2023-08-16: 4 mg via INTRAVENOUS

## 2023-08-16 MED ORDER — MIDAZOLAM HCL 2 MG/2ML IJ SOLN
INTRAMUSCULAR | Status: DC | PRN
  Administered 2023-08-16: 2 mg via INTRAVENOUS

## 2023-08-16 MED ORDER — CEFAZOLIN SODIUM-DEXTROSE 2-3 GM-%(50ML) IV SOLR
INTRAVENOUS | Status: DC | PRN
Start: 2023-08-16 — End: 2023-08-16
  Administered 2023-08-16: 2 g via INTRAVENOUS

## 2023-08-16 MED ORDER — PROPOFOL 500 MG/50ML IV EMUL
INTRAVENOUS | Status: AC
  Filled 2023-08-16: qty 50

## 2023-08-16 MED ORDER — OXYCODONE HCL 5 MG PO TABS: 5.0000 mg | ORAL_TABLET | Freq: Four times a day (QID) | ORAL | 0 refills | Status: AC | PRN

## 2023-08-16 MED ORDER — FENTANYL CITRATE (PF) 100 MCG/2ML IJ SOLN: 25.0000 ug | INTRAMUSCULAR | Status: DC | PRN

## 2023-08-16 MED ORDER — OXYCODONE HCL 5 MG/5ML PO SOLN: 5.0000 mg | Freq: Once | ORAL | Status: DC | PRN

## 2023-08-16 MED ORDER — MIDAZOLAM HCL 2 MG/2ML IJ SOLN
INTRAMUSCULAR | Status: AC
  Filled 2023-08-16: qty 2

## 2023-08-16 MED ORDER — LACTATED RINGERS IV SOLN: INTRAVENOUS | Status: DC

## 2023-08-16 MED ORDER — PROPOFOL 500 MG/50ML IV EMUL
INTRAVENOUS | Status: DC | PRN
  Administered 2023-08-16: 125 ug/kg/min via INTRAVENOUS

## 2023-08-16 MED ORDER — BUPIVACAINE HCL 0.25 % IJ SOLN
INTRAMUSCULAR | Status: DC | PRN
  Administered 2023-08-16: 10 mL

## 2023-08-16 MED ORDER — FENTANYL CITRATE (PF) 100 MCG/2ML IJ SOLN
INTRAMUSCULAR | Status: AC
  Filled 2023-08-16: qty 2

## 2023-08-16 MED ORDER — ACETAMINOPHEN 500 MG PO TABS
1000.0000 mg | ORAL_TABLET | Freq: Once | ORAL | Status: AC
  Administered 2023-08-16: 1000 mg via ORAL

## 2023-08-16 MED ORDER — AMISULPRIDE (ANTIEMETIC) 5 MG/2ML IV SOLN: 10.0000 mg | Freq: Once | INTRAVENOUS | Status: DC | PRN

## 2023-08-16 MED ORDER — ACETAMINOPHEN 500 MG PO TABS
ORAL_TABLET | ORAL | Status: AC
  Filled 2023-08-16: qty 2

## 2023-08-16 SURGICAL SUPPLY — 31 items
BLADE SURG 15 STRL LF DISP TIS (BLADE) ×1 IMPLANT
BLADE SURG 15 STRL SS (BLADE) ×1
BNDG ELASTIC 2INX 5YD STR LF (GAUZE/BANDAGES/DRESSINGS) ×1 IMPLANT
BNDG ESMARK 4X9 LF (GAUZE/BANDAGES/DRESSINGS) ×1 IMPLANT
CHLORAPREP W/TINT 26 (MISCELLANEOUS) ×1 IMPLANT
CORD BIPOLAR FORCEPS 12FT (ELECTRODE) ×1 IMPLANT
COVER BACK TABLE 60X90IN (DRAPES) ×1 IMPLANT
CUFF TOURN SGL QUICK 18X4 (TOURNIQUET CUFF) IMPLANT
CUFF TOURN SGL QUICK 24 (TOURNIQUET CUFF)
CUFF TRNQT CYL 24X4X16.5-23 (TOURNIQUET CUFF) IMPLANT
DRAPE EXTREMITY T 121X128X90 (DISPOSABLE) ×1 IMPLANT
DRAPE SURG 17X23 STRL (DRAPES) ×1 IMPLANT
GAUZE SPONGE 4X4 12PLY STRL (GAUZE/BANDAGES/DRESSINGS) IMPLANT
GAUZE XEROFORM 1X8 LF (GAUZE/BANDAGES/DRESSINGS) ×1 IMPLANT
GLOVE BIO SURGEON STRL SZ7 (GLOVE) ×1 IMPLANT
GLOVE BIOGEL PI IND STRL 7.0 (GLOVE) ×1 IMPLANT
GLOVE BIOGEL PI IND STRL 7.5 (GLOVE) IMPLANT
GLOVE SURG SS PI 7.0 STRL IVOR (GLOVE) IMPLANT
GOWN STRL REUS W/ TWL LRG LVL3 (GOWN DISPOSABLE) ×2 IMPLANT
GOWN STRL REUS W/TWL LRG LVL3 (GOWN DISPOSABLE) ×2
NDL HYPO 25X1 1.5 SAFETY (NEEDLE) ×1 IMPLANT
NEEDLE HYPO 25X1 1.5 SAFETY (NEEDLE) ×1 IMPLANT
NS IRRIG 1000ML POUR BTL (IV SOLUTION) ×1 IMPLANT
PACK BASIN DAY SURGERY FS (CUSTOM PROCEDURE TRAY) ×1 IMPLANT
SHEET MEDIUM DRAPE 40X70 STRL (DRAPES) ×1 IMPLANT
SUT ETHILON 4 0 PS 2 18 (SUTURE) ×1 IMPLANT
SUT VIC AB 4-0 PS2 18 (SUTURE) IMPLANT
SYR BULB EAR ULCER 3OZ GRN STR (SYRINGE) ×1 IMPLANT
SYR CONTROL 10ML LL (SYRINGE) ×1 IMPLANT
TOWEL GREEN STERILE FF (TOWEL DISPOSABLE) ×1 IMPLANT
UNDERPAD 30X36 HEAVY ABSORB (UNDERPADS AND DIAPERS) ×1 IMPLANT

## 2023-08-16 NOTE — Transfer of Care (Signed)
Immediate Anesthesia Transfer of Care Note  Patient: Kimberly Hartman  Procedure(s) Performed: LEFT INDEX AND MIDDLE FINGER RELEASE TRIGGER FINGER/A-1 PULLEY (Left)  Patient Location: PACU  Anesthesia Type:MAC  Level of Consciousness: awake  Airway & Oxygen Therapy: Patient Spontanous Breathing  Post-op Assessment: Report given to RN  Post vital signs: Reviewed and stable  Last Vitals:  Vitals Value Taken Time  BP 92/65 08/16/23 1142  Temp    Pulse 68 08/16/23 1144  Resp 15 08/16/23 1144  SpO2 99 % 08/16/23 1144  Vitals shown include unfiled device data.  Last Pain:  Vitals:   08/16/23 0903  TempSrc: Temporal  PainSc: 0-No pain      Patients Stated Pain Goal: 3 (08/16/23 0903)  Complications: No notable events documented.

## 2023-08-16 NOTE — Interval H&P Note (Signed)
History and Physical Interval Note:  08/16/2023 10:19 AM  Kimberly Hartman  has presented today for surgery, with the diagnosis of Acquired trigger finger of left middle finger.  The various methods of treatment have been discussed with the patient and family. After consideration of risks, benefits and other options for treatment, the patient has consented to  Procedure(s) with comments: LEFT INDEX AND MIDDLE FINGER RELEASE TRIGGER FINGER/A-1 PULLEY (Left) - MAC AND LOCAL as a surgical intervention.  The patient's history has been reviewed, patient examined, no change in status, stable for surgery.  I have reviewed the patient's chart and labs.  Questions were answered to the patient's satisfaction.     Ed Rayson

## 2023-08-16 NOTE — Anesthesia Postprocedure Evaluation (Signed)
Anesthesia Post Note  Patient: Kimberly Hartman  Procedure(s) Performed: LEFT INDEX AND MIDDLE FINGER RELEASE TRIGGER FINGER/A-1 PULLEY (Left)     Patient location during evaluation: PACU Anesthesia Type: MAC Level of consciousness: awake Pain management: pain level controlled Vital Signs Assessment: post-procedure vital signs reviewed and stable Respiratory status: spontaneous breathing, nonlabored ventilation and respiratory function stable Cardiovascular status: stable and blood pressure returned to baseline Postop Assessment: no apparent nausea or vomiting Anesthetic complications: no   No notable events documented.  Last Vitals:  Vitals:   08/16/23 1200 08/16/23 1208  BP: (!) 110/56 123/71  Pulse: 62 69  Resp: (!) 23 20  Temp:  (!) 36.2 C  SpO2: 95% 95%    Last Pain:  Vitals:   08/16/23 1208  TempSrc:   PainSc: 0-No pain                 Linton Rump

## 2023-08-16 NOTE — Op Note (Signed)
   Date of Surgery: 08/16/2023  INDICATIONS: Patient is a 72 y.o.-year-old female with left index and middle finger stenosing tenosynovitis that has failed conservative management.  Risks, benefits, and alternatives to surgery were again discussed with the patient in the preoperative area. The patient wishes to proceed with surgery.  Informed consent was signed after our discussion.   PREOPERATIVE DIAGNOSIS:  Left index stenosing tenosynovitis Left middle stenosing tenosynovitis  POSTOPERATIVE DIAGNOSIS: Same.  PROCEDURE:  Left index A1 pulley release Left middle A1 pulley release   SURGEON: Waylan Rocher, M.D.  ASSIST: None  ANESTHESIA:  Local, MAC  IV FLUIDS AND URINE: See anesthesia.  ESTIMATED BLOOD LOSS: <5 mL.  IMPLANTS: * No implants in log *   DRAINS: None  COMPLICATIONS: None  DESCRIPTION OF PROCEDURE: The patient was met in the preoperative holding area where the surgical site was marked and the informed consent form was signed.  The patient was then brought back to the operating room and remained on the stretcher.  A hand table was placed adjacent to the operative extremity and locked into place.  A tourniquet was placed on the left forearm.  A formal timeout was performed to confirm that this was the correct patient, surgical side, surgical site, and surgical procedure.  All were present and in agreement. Following formal timeout, a local block was performed using 10 mL of an equal mixture of 1% plain lidocaine and 0.25% plain marcaine.  The left upper extremity was then prepped and draped in the usual and sterile fashion.    The limb was exsanguinated and the tourniquet inflated to 250 mmHg.  A longitudinal incision was made over the index and middle finger A1 pulleys.  The skin was incised.  Blunt dissection was used to identify the A1 pulley of the middle finger.  Two Ragnell retractors were placed on the radial and ulnar sides of the pulley to protect the  respective neurovascular bundles.  A third Ragnell was placed at the distal aspect of the wound.  The A1 pulley was clearly identified.  Under direct visualization, the pulley was entered sharply using a 15 blade.  Tenotomy scissors were used to complete the pulley release distally to the level of the A2 pulley.  The distal retractor was then placed in the proximal aspect of the wound.  Under direct visualization, the proximal aspect of the A1 pulley was completely released. This same process was complete for release of the A1 pulley of the index finger  Following satisfactory A1 pulley release of both the index and middle fingers, the patient was reversed from sedation and asked to fully flex and extend the involved fingers.  There was no catching or triggering present.  The tourniquet was let down and hemostasis achieved with direct pressure and bipolar electrocautery.  The wounds were then thoroughly irrigated. They were closed using 4-0 nylon sutures in a horizontal mattress fashion.  The wounds were dressed with xeroform, 4x4, and an ace wrap.   The patient was reversed from sedation.  All counts were correct x 2 at the end of the procedure.  The patient was then taken to the PACU in stable condition.   POSTOPERATIVE PLAN: She will be discharged to home with appropriate pain medication and discharge instructions.  I'll see her back in 10-14 days for her first postop visit.   Waylan Rocher, MD 11:39 AM

## 2023-08-16 NOTE — Anesthesia Preprocedure Evaluation (Addendum)
Anesthesia Evaluation  Patient identified by MRN, date of birth, ID band Patient awake    Reviewed: Allergy & Precautions, NPO status , Patient's Chart, lab work & pertinent test results  History of Anesthesia Complications Negative for: history of anesthetic complications  Airway Mallampati: III  TM Distance: >3 FB Neck ROM: Full    Dental  (+) Dental Advisory Given   Pulmonary neg shortness of breath, sleep apnea , COPD (does not use inhalers, no recent flares), neg recent URI, Current Smoker   Pulmonary exam normal breath sounds clear to auscultation       Cardiovascular (-) hypertension(-) angina (-) Past MI, (-) Cardiac Stents and (-) CABG (-) dysrhythmias  Rhythm:Regular Rate:Normal  HLD   Neuro/Psych neg Seizures PSYCHIATRIC DISORDERS Anxiety Depression    negative neurological ROS     GI/Hepatic negative GI ROS, Neg liver ROS,,,  Endo/Other  diabetes, Type 2Hypothyroidism    Renal/GU negative Renal ROS     Musculoskeletal  (+) Arthritis , Osteoarthritis,  Osteopenia    Abdominal   Peds  Hematology negative hematology ROS (+)   Anesthesia Other Findings Last Ozempic: 08/09/2023  Reproductive/Obstetrics                             Anesthesia Physical Anesthesia Plan  ASA: 2  Anesthesia Plan: MAC   Post-op Pain Management: Tylenol PO (pre-op)*   Induction: Intravenous  PONV Risk Score and Plan: 1 and Ondansetron, Dexamethasone, Propofol infusion and TIVA  Airway Management Planned: Natural Airway and Simple Face Mask  Additional Equipment:   Intra-op Plan:   Post-operative Plan:   Informed Consent: I have reviewed the patients History and Physical, chart, labs and discussed the procedure including the risks, benefits and alternatives for the proposed anesthesia with the patient or authorized representative who has indicated his/her understanding and acceptance.      Dental advisory given  Plan Discussed with: CRNA and Anesthesiologist  Anesthesia Plan Comments: (Discussed with patient risks of MAC including, but not limited to, minor pain or discomfort, hearing people in the room, and possible need for backup general anesthesia. Risks for general anesthesia also discussed including, but not limited to, sore throat, hoarse voice, chipped/damaged teeth, injury to vocal cords, nausea and vomiting, allergic reactions, lung infection, heart attack, stroke, and death. All questions answered. )       Anesthesia Quick Evaluation

## 2023-08-16 NOTE — Discharge Instructions (Addendum)
Kimberly Hartman, M.D. Hand Surgery  POST-OPERATIVE DISCHARGE INSTRUCTIONS   PRESCRIPTIONS: - You may have been given a prescription to be taken as directed for post-operative pain control.  You may also take over the counter ibuprofen/aleve and tylenol for pain. Take this as directed on the packaging. Do not exceed 3000 mg tylenol/acetaminophen in 24 hours.  Ibuprofen 600-800 mg (3-4) tablets by mouth every 6 hours as needed for pain.   OR  Aleve 2 tablets by mouth every 12 hours (twice daily) as needed for pain.   AND/OR  Tylenol 1000 mg (2 tablets) every 8 hours as needed for pain.  - Please use your pain medication carefully, as refills are limited and you may not be provided with one.  As stated above, please use over the counter pain medicine - it will also be helpful with decreasing your swelling.    ANESTHESIA: -After your surgery, post-surgical discomfort or pain is likely. This discomfort can last several days to a few weeks. At certain times of the day your discomfort may be more intense.   Did you receive a nerve block?   - A nerve block can provide pain relief for one hour to two days after your surgery. As long as the nerve block is working, you will experience little or no sensation in the area the surgeon operated on.  - As the nerve block wears off, you will begin to experience pain or discomfort. It is very important that you begin taking your prescribed pain medication before the nerve block fully wears off. Treating your pain at the first sign of the block wearing off will ensure your pain is better controlled and more tolerable when full-sensation returns. Do not wait until the pain is intolerable, as the medicine will be less effective. It is better to treat pain in advance than to try and catch up.   General Anesthesia:  If you did not receive a nerve block during your surgery, you will need to start taking your pain medication shortly after your surgery and  should continue to do so as prescribed by your surgeon.     ICE AND ELEVATION: - You may use ice for the first 48-72 hours, but it is not critical.   - Motion of your fingers is very important to decrease the swelling.  - Elevation, as much as possible for the next 48 hours, is critical for decreasing swelling as well as for pain relief. Elevation means when you are seated or lying down, you hand should be at or above your heart. When walking, the hand needs to be at or above the level of your elbow.  - If the bandage gets too tight, it may need to be loosened. Please contact our office and we will instruct you in how to do this.    SURGICAL BANDAGES:  - Keep your dressing and/or splint clean and dry at all times.  You can remove your dressing 7 days from now and change with a dry dressing or Band-Aids as needed thereafter. - You may place a plastic bag over your bandage to shower, but be careful, do not get your bandages wet.  - After the bandages have been removed, it is OK to get the stitches wet in a shower or with hand washing. Do Not soak or submerge the wound yet. Please do not use lotions or creams on the stitches.      HAND THERAPY:  - You may not need any. If you  do, we will begin this at your follow up visit in the clinic.    ACTIVITY AND WORK: - You are encouraged to move any fingers which are not in the bandage.  - Light use of the fingers is allowed to assist the other hand with daily hygiene and eating, but strong gripping or lifting is often uncomfortable and should be avoided.  - You might miss a variable period of time from work and hopefully this issue has been discussed prior to surgery. You may not do any heavy work with your affected hand for about 2 weeks.    [facility] Second Floor, [address] [place], [place] [address] [phone]    Post Anesthesia Home Care Instructions  Activity: Get plenty of rest for the remainder of the day. A  responsible individual must stay with you for 24 hours following the procedure.  For the next 24 hours, DO NOT: -Drive a car -Advertising copywriter -Drink alcoholic beverages -Take any medication unless instructed by your physician -Make any legal decisions or sign important papers.  Meals: Start with liquid foods such as gelatin or soup. Progress to regular foods as tolerated. Avoid greasy, spicy, heavy foods. If nausea and/or vomiting occur, drink only clear liquids until the nausea and/or vomiting subsides. Call your physician if vomiting continues.  Special Instructions/Symptoms: Your throat may feel dry or sore from the anesthesia or the breathing tube placed in your throat during surgery. If this causes discomfort, gargle with warm salt water. The discomfort should disappear within 24 hours.  If you had a scopolamine patch placed behind your ear for the management of post- operative nausea and/or vomiting:  1. The medication in the patch is effective for 72 hours, after which it should be removed.  Wrap patch in a tissue and discard in the trash. Wash hands thoroughly with soap and water. 2. You may remove the patch earlier than 72 hours if you experience unpleasant side effects which may include dry mouth, dizziness or visual disturbances. 3. Avoid touching the patch. Wash your hands with soap and water after contact with the patch.     May have Tylenol today after 3:15 PM

## 2023-08-16 NOTE — H&P (Signed)
HAND SURGERY   HPI: Patient is a 72 y.o. female who presents with stenosing tenosynovitis involving the left index and middle fingers.  She has failed extensive conservative management..  Patient denies any changes to their medical history or new systemic symptoms today.    Past Medical History:  Diagnosis Date   Allergy    Anxiety    Arthritis    Cataract    removed both , lens implants   COPD (chronic obstructive pulmonary disease) (HCC)    Diabetes mellitus without complication (HCC)    Psoriasis    Thyroid disease    Past Surgical History:  Procedure Laterality Date   ABDOMINAL HYSTERECTOMY  2001   ABDOMINAL SURGERY     ADENOIDECTOMY  1956   APPENDECTOMY  1967   BREAST EXCISIONAL BIOPSY Right    benign   COLONOSCOPY     DENTAL SURGERY     EYE SURGERY  2018   Cataract   KNEE ARTHROSCOPY WITH MENISCAL REPAIR Right    x2   POLYPECTOMY     Social History   Socioeconomic History   Marital status: Married    Spouse name: [name]   Number of children: 0   Years of education: bachelors   Highest education level: Not on file  Occupational History   Not on file  Tobacco Use   Smoking status: Every Day    Current packs/day: 1.00    Average packs/day: 1 pack/day for 50.0 years (50.0 ttl pk-yrs)    Types: Cigarettes   Smokeless tobacco: Never   Tobacco comments:    Smoking about 1/2 ppd.  Not trying to quit.  11/24/2022 hfb  Vaping Use   Vaping status: Never Used  Substance and Sexual Activity   Alcohol use: Yes    Comment: wine a few times a week   Drug use: Never   Sexual activity: Not Currently    Birth control/protection: Surgical  Other Topics Concern   Not on file  Social History Narrative   11/12/20   From: [place] area originally, moved to [facility] 2013 to be near brother   Living: with husband, Elijah Birk (2018)   Dog [name].        College at [employer]      Family: 2 step children - ok relationship and step grandchildren, brother in [place]       Exercise: walking the dog   Diet: diabetic diet - tries to avoid carbs and sugar      Safety   Seat belts: Yes    Guns: No   Safe in relationships: Yes    Social Determinants of Health   Financial Resource Strain: Low Risk  (11/09/2022)   Overall Financial Resource Strain (CARDIA)    Difficulty of Paying Living Expenses: Not hard at all  Food Insecurity: No Food Insecurity (11/09/2022)   Hunger Vital Sign    Worried About Running Out of Food in the Last Year: Never true    Ran Out of Food in the Last Year: Never true  Transportation Needs: No Transportation Needs (11/09/2022)   PRAPARE - Administrator, Civil Service (Medical): No    Lack of Transportation (Non-Medical): No  Physical Activity: Insufficiently Active (11/09/2022)   Exercise Vital Sign    Days of Exercise per Week: 4 days    Minutes of Exercise per Session: 20 min  Stress: No Stress Concern Present (11/09/2022)   [name] of Occupational Health - Occupational Stress Questionnaire    Feeling  of Stress : Not at all  Social Connections: Moderately Isolated (11/09/2022)   Social Connection and Isolation Panel [NHANES]    Frequency of Communication with Friends and Family: More than three times a week    Frequency of Social Gatherings with Friends and Family: Twice a week    Attends Religious Services: Never    Diplomatic Services operational officer: Patient declined    Attends Engineer, structural: Never    Marital Status: Married   Family History  Problem Relation Age of Onset   Lung cancer Mother    Esophageal cancer Father    Stroke Maternal Grandmother    Heart disease Maternal Grandmother    Colon cancer Neg Hx    Rectal cancer Neg Hx    Stomach cancer Neg Hx    Colon polyps Neg Hx    Breast cancer Neg Hx    - negative except otherwise stated in the family history section Allergies  Allergen Reactions   Chantix [Varenicline Tartrate]    Glimepiride     hypoglycemia   Metformin  And Related    Reclast [Zoledronic Acid]     Aches, joint pain   Neo-Bacit-Poly-Lidocaine Itching, Swelling, Dermatitis and Rash    She can tolerate bacitracin.     Penicillins Rash    TOLERATED KEFLEX WITH NO ISSUES   Tape Dermatitis and Rash    THE ADHESIVE PART NOT THE ACTUAL TAPE   Tetracyclines & Related Rash   Wellbutrin [Bupropion] Rash   Prior to Admission medications   Medication Sig Start Date End Date Taking? Authorizing Provider  acetaminophen (TYLENOL) 325 MG tablet Take 650 mg by mouth every 6 (six) hours as needed.   Yes [provider]  atorvastatin (LIPITOR) 40 MG tablet Take 1 tablet (40 mg total) by mouth daily. 02/13/23  Yes Joaquim Nam, MD  Calcium Carbonate-Vitamin D (CALCIUM-D) 600-400 MG-UNIT TABS Take 2 tablets by mouth daily.   Yes [provider]  cetirizine (ZYRTEC) 10 MG tablet Take 10 mg by mouth daily as needed for allergies.   Yes [provider]  Cholecalciferol (VITAMIN D3) 1000 units CAPS Take 1 capsule by mouth daily.   Yes [provider]  ibuprofen (ADVIL) 200 MG tablet Take 200 mg by mouth every 6 (six) hours as needed.   Yes [provider]  levothyroxine (SYNTHROID) 100 MCG tablet Take 1 tablet (100 mcg total) by mouth every morning. 02/13/23  Yes Joaquim Nam, MD  Multiple Vitamins-Minerals (MULTIVITAMIN ADULTS 50+) TABS Take 1 tablet by mouth daily.   Yes [provider]  pioglitazone (ACTOS) 45 MG tablet Take 45 mg by mouth daily. 03/09/22  Yes [provider]  Secukinumab (COSENTYX SENSOREADY, 300 MG, [place]) Inject 300 mg into the skin. 02/25/23  Yes [provider]  Semaglutide,0.25 or 0.5MG /DOS, (OZEMPIC, 0.25 OR 0.5 MG/DOSE,) 2 MG/1.5ML SOPN Inject into the skin. As directed   Yes [provider]  sertraline (ZOLOFT) 50 MG tablet TAKE 1 TABLET BY MOUTH DAILY 08/03/23  Yes Joaquim Nam, MD  diphenhydrAMINE (BENADRYL) 25 MG tablet Take 25 mg by mouth daily as  needed.    [provider]  glucose blood (ONE TOUCH ULTRA TEST) test strip Use to check blood sugar once a day. 05/07/20   Emi Belfast, FNP  hydrocortisone 2.5 % cream Apply topically 2 (two) times daily. Apply to rash between the breast BID prn x up to 2 weeks. 05/29/21   Moye, [place],  MD  triamcinolone cream (KENALOG) 0.1 % Apply 1 Application topically 2 (two) times daily as needed. 02/15/23   Karie Schwalbe, MD   No results found. - Positive ROS: All other systems have been reviewed and were otherwise negative with the exception of those mentioned in the HPI and as above.  Physical Exam: General: No acute distress, resting comfortably Cardiovascular: BUE warm and well perfused, normal rate Respiratory: Normal WOB on RA Skin: Warm and dry Neurologic: Sensation intact distally Psychiatric: Patient is at baseline mood and affect  Left upper Extremity  Obvious locking and catching of the index and middle fingers with flexion.  There is a palpable nodule over the A1 pulley with associated tenderness palpation.  There is no locking or catching of the adjacent fingers.  Sensations intact light touch throughout the hand.  All fingers are warm and well-perfused with brisk capillary refill.   Assessment: 72 year old female with stenosing tenosynovitis involving the left index and middle fingers that has failed extensive nonsurgical management.  Plan: OR today for left index and middle finger A1 pulley release. We again reviewed the risks of surgery which include bleeding, infection, damage to neurovascular structures, persistent symptoms, finger stiffness, persistent swelling, need for additional surgery.  Informed consent was signed.  All questions were answered.   Marlyne Beards, M.D. [facility] 10:18 AM

## 2023-08-17 ENCOUNTER — Encounter (HOSPITAL_BASED_OUTPATIENT_CLINIC_OR_DEPARTMENT_OTHER): Payer: Self-pay | Admitting: Orthopedic Surgery

## 2023-08-17 ENCOUNTER — Ambulatory Visit (INDEPENDENT_AMBULATORY_CARE_PROVIDER_SITE_OTHER): Payer: Medicare Other | Admitting: Dermatology

## 2023-08-17 DIAGNOSIS — L814 Other melanin hyperpigmentation: Secondary | ICD-10-CM

## 2023-08-17 DIAGNOSIS — L920 Granuloma annulare: Secondary | ICD-10-CM

## 2023-08-17 DIAGNOSIS — L409 Psoriasis, unspecified: Secondary | ICD-10-CM

## 2023-08-17 DIAGNOSIS — L821 Other seborrheic keratosis: Secondary | ICD-10-CM

## 2023-08-17 DIAGNOSIS — D492 Neoplasm of unspecified behavior of bone, soft tissue, and skin: Secondary | ICD-10-CM

## 2023-08-17 DIAGNOSIS — D1801 Hemangioma of skin and subcutaneous tissue: Secondary | ICD-10-CM | POA: Diagnosis not present

## 2023-08-17 DIAGNOSIS — L578 Other skin changes due to chronic exposure to nonionizing radiation: Secondary | ICD-10-CM | POA: Diagnosis not present

## 2023-08-17 DIAGNOSIS — Z5181 Encounter for therapeutic drug level monitoring: Secondary | ICD-10-CM

## 2023-08-17 DIAGNOSIS — L432 Lichenoid drug reaction: Secondary | ICD-10-CM | POA: Diagnosis not present

## 2023-08-17 DIAGNOSIS — Z7189 Other specified counseling: Secondary | ICD-10-CM

## 2023-08-17 DIAGNOSIS — Z79899 Other long term (current) drug therapy: Secondary | ICD-10-CM

## 2023-08-17 DIAGNOSIS — Z1283 Encounter for screening for malignant neoplasm of skin: Secondary | ICD-10-CM

## 2023-08-17 DIAGNOSIS — W908XXA Exposure to other nonionizing radiation, initial encounter: Secondary | ICD-10-CM | POA: Diagnosis not present

## 2023-08-17 DIAGNOSIS — D229 Melanocytic nevi, unspecified: Secondary | ICD-10-CM

## 2023-08-17 NOTE — Patient Instructions (Addendum)

## 2023-08-17 NOTE — Progress Notes (Addendum)
Follow-Up Visit   Subjective  Kimberly Hartman is a 72 y.o. female who presents for the following: Skin Cancer Screening and Full Body Skin Exam no hx of skin cancer, Psoriasis hands, feet, Cosentyx sq injections q month  The patient presents for Total-Body Skin Exam (TBSE) for skin cancer screening and mole check. The patient has spots, moles and lesions to be evaluated, some may be new or changing and the patient may have concern these could be cancer.    The following portions of the chart were reviewed this encounter and updated as appropriate: medications, allergies, medical history  Review of Systems:  No other skin or systemic complaints except as noted in HPI or Assessment and Plan.  Objective  Well appearing patient in no apparent distress; mood and affect are within normal limits.  A full examination was performed including scalp, head, eyes, ears, nose, lips, neck, chest, axillae, abdomen, back, buttocks, bilateral upper extremities, bilateral lower extremities, hands, feet, fingers, toes, fingernails, and toenails. All findings within normal limits unless otherwise noted below.   Exam of nails limited by presence of nail polish.   Relevant physical exam findings are noted in the Assessment and Plan.  central back Large blanchable vascular patch 31.0 x 12.5cm       Assessment & Plan   SKIN CANCER SCREENING PERFORMED TODAY.  ACTINIC DAMAGE - Chronic condition, secondary to cumulative UV/sun exposure - diffuse scaly erythematous macules with underlying dyspigmentation - Recommend daily broad spectrum sunscreen SPF 30+ to sun-exposed areas, reapply every 2 hours as needed.  - Staying in the shade or wearing long sleeves, sun glasses (UVA+UVB protection) and wide brim hats (4-inch brim around the entire circumference of the hat) are also recommended for sun protection.  - Call for new or changing lesions.  LENTIGINES, SEBORRHEIC KERATOSES, HEMANGIOMAS - Benign normal  skin lesions - Benign-appearing - Call for any changes  MELANOCYTIC NEVI - Tan-brown and/or pink-flesh-colored symmetric macules and papules - Benign appearing on exam today - Observation - Call clinic for new or changing moles - Recommend daily use of broad spectrum spf 30+ sunscreen to sun-exposed areas.   PSORIASIS on Systemic Treatment Hands, feet Exam: hands and feet clear today 0% BSA on medication  Chronic and persistent condition with duration or expected duration over one year. Condition is well-controlled.   Labs TB gold due  Psoriasis - severe on systemic treatment.  Psoriasis is a chronic non-curable, but treatable genetic/hereditary disease that may have other systemic features affecting other organ systems such as joints (Psoriatic Arthritis).  It is linked with heart disease, inflammatory bowel disease, non-alcoholic fatty liver disease, and depression. Significant skin psoriasis and/or psoriatic arthritis may have significant symptoms and affects activities of daily activity and often benefits from systemic treatments.  These systemic treatments have some potential side effects including immunosuppression and require pre-treatment laboratory screening and periodic laboratory monitoring and periodic in person evaluation and monitoring by the attending dermatologist physician (long term medication management).   Reviewed risks of biologics including immunosuppression, infections, injection site reaction, and failure to improve condition. Goal is control of skin condition, not cure.  Some older biologics such as Humira and Enbrel may slightly increase risk of malignancy and may worsen congestive heart failure.  Taltz and Cosentyx may cause inflammatory bowel disease to flare. The use of biologics requires long term medication management, including periodic office visits and monitoring of blood work.   Treatment Plan: Cont Cosentyx 300 mg sq injections q 4 week  Reviewed risks  of biologics including immunosuppression, infections, injection site reaction, and failure to improve condition. Goal is control of skin condition, not cure.  Some older biologics such as Humira and Enbrel may slightly increase risk of malignancy and may worsen congestive heart failure.  Taltz and Cosentyx may cause inflammatory bowel disease to flare. The use of biologics requires long term medication management, including periodic office visits and monitoring of blood work.   Long term medication management.  Patient is using long term (months to years) prescription medication  to control their dermatologic condition.  These medications require periodic monitoring to evaluate for efficacy and side effects and may require periodic laboratory monitoring.     GRANULOMA ANNULARE Bil thighs pink annular plaques  Granuloma annulare is a chronic benign skin condition characterized by pink smooth bumps or ring-like plaques most commonly appearing over the joints and the backs of the hands/feet. Its cause is not known, and most episodes of granuloma annulare clear up after a few years, with or without treatment.  Exam:   Treatment Plan: Benign-appearing.  Observation.  Call clinic for new or changing moles.  Recommend daily use of broad spectrum spf 30+ sunscreen to sun-exposed areas.     Neoplasm of skin central back  Skin / nail biopsy Type of biopsy: tangential   Informed consent: discussed and consent obtained   Timeout: patient name, date of birth, surgical site, and procedure verified   Procedure prep:  Patient was prepped and draped in usual sterile fashion Prep type:  Isopropyl alcohol Anesthesia: the lesion was anesthetized in a standard fashion   Anesthetic:  1% lidocaine w/ epinephrine 1-100,000 buffered w/ 8.4% NaHCO3 Instrument used: DermaBlade   Hemostasis achieved with: pressure and aluminum chloride   Outcome: patient tolerated procedure well   Post-procedure details: sterile  dressing applied and wound care instructions given   Dressing type: bandage and petrolatum    Specimen 1 - Surgical pathology Differential Diagnosis: D48.5 Granuloma annulare vs vascular malformation vs angiosarcoma vs Resolving psoriasis vs other  Check Margins: No Large blanchable vascular patch 31.0 x 12.5cm  Offered punch vs shave. Patient prefers shave biopsy  Psoriasis  Related Procedures QuantiFERON-TB Gold Plus  Granuloma annulare  Multiple benign nevi  Lentigines  Actinic elastosis  Seborrheic keratoses  Cherry angioma      Return in about 6 months (around 02/14/2024) for Psoriasis f/u, 1 yr TBSE and Psoriasis f/u.  I, Ardis Rowan, RMA, am acting as scribe for Elie Goody, MD .   Documentation: I have reviewed the above documentation for accuracy and completeness, and I agree with the above.  Elie Goody, MD

## 2023-08-21 DIAGNOSIS — L409 Psoriasis, unspecified: Secondary | ICD-10-CM | POA: Diagnosis not present

## 2023-08-21 LAB — SURGICAL PATHOLOGY

## 2023-08-23 LAB — QUANTIFERON-TB GOLD PLUS
QuantiFERON Mitogen Value: >10 [IU]/mL
QuantiFERON Nil Value: 0 [IU]/mL
QuantiFERON TB1 Ag Value: 0 [IU]/mL
QuantiFERON TB2 Ag Value: 0 [IU]/mL
QuantiFERON-TB Gold Plus: NEGATIVE

## 2023-08-27 ENCOUNTER — Encounter: Payer: Self-pay | Admitting: Dermatology

## 2023-08-29 ENCOUNTER — Other Ambulatory Visit: Payer: Self-pay | Admitting: Dermatology

## 2023-08-29 ENCOUNTER — Telehealth: Payer: Self-pay

## 2023-08-29 DIAGNOSIS — E039 Hypothyroidism, unspecified: Secondary | ICD-10-CM | POA: Diagnosis not present

## 2023-08-29 DIAGNOSIS — E1169 Type 2 diabetes mellitus with other specified complication: Secondary | ICD-10-CM | POA: Diagnosis not present

## 2023-08-29 DIAGNOSIS — L280 Lichen simplex chronicus: Secondary | ICD-10-CM

## 2023-08-29 DIAGNOSIS — E119 Type 2 diabetes mellitus without complications: Secondary | ICD-10-CM | POA: Diagnosis not present

## 2023-08-29 DIAGNOSIS — E785 Hyperlipidemia, unspecified: Secondary | ICD-10-CM | POA: Diagnosis not present

## 2023-08-29 MED ORDER — CLOBETASOL PROPIONATE 0.05 % EX CREA: 1.0000 | TOPICAL_CREAM | Freq: Two times a day (BID) | CUTANEOUS | 0 refills | Status: DC

## 2023-08-29 NOTE — Telephone Encounter (Signed)
Two samples of Cosentyx 300mg  UNO Ready given to patient today.   LOT: [id number] EXP: 11/30/2024

## 2023-09-02 IMAGING — MG MM DIGITAL SCREENING BILAT W/ TOMO AND CAD
8 series · 8 of 24 positions shown · non-contrast
Comparison: Previous exam(s).

CLINICAL DATA: Screening.

EXAM:
DIGITAL SCREENING BILATERAL MAMMOGRAM WITH TOMOSYNTHESIS AND CAD
TECHNIQUE: Bilateral screening digital craniocaudal and mediolateral oblique
mammograms were obtained. Bilateral screening digital breast
tomosynthesis was performed. The images were evaluated with
computer-aided detection.

[R MLO synth-2D]
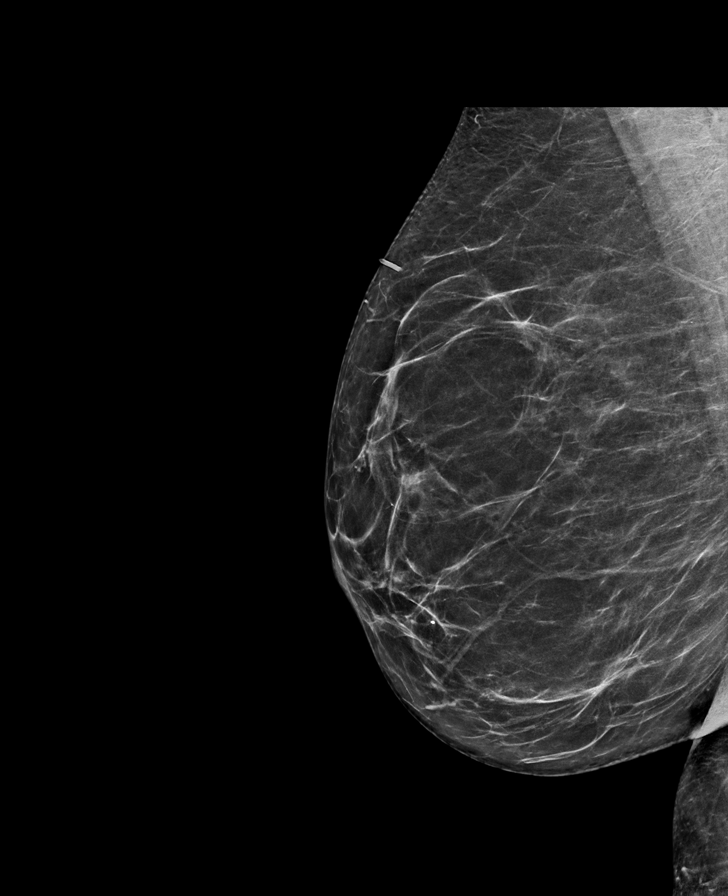

[L MLO synth-2D]
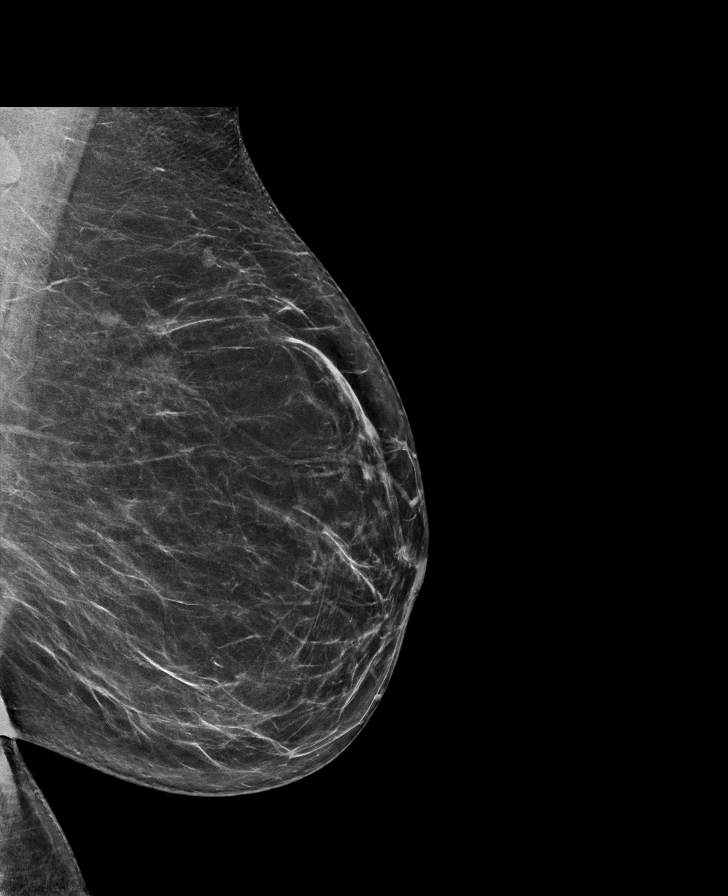

[R CC synth-2D]
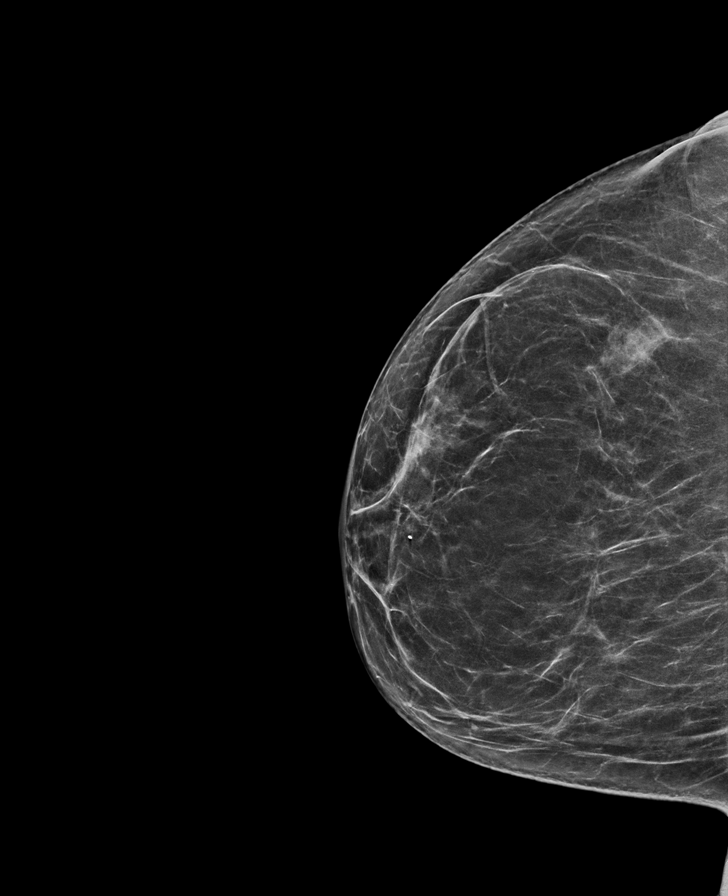

[L CC synth-2D]
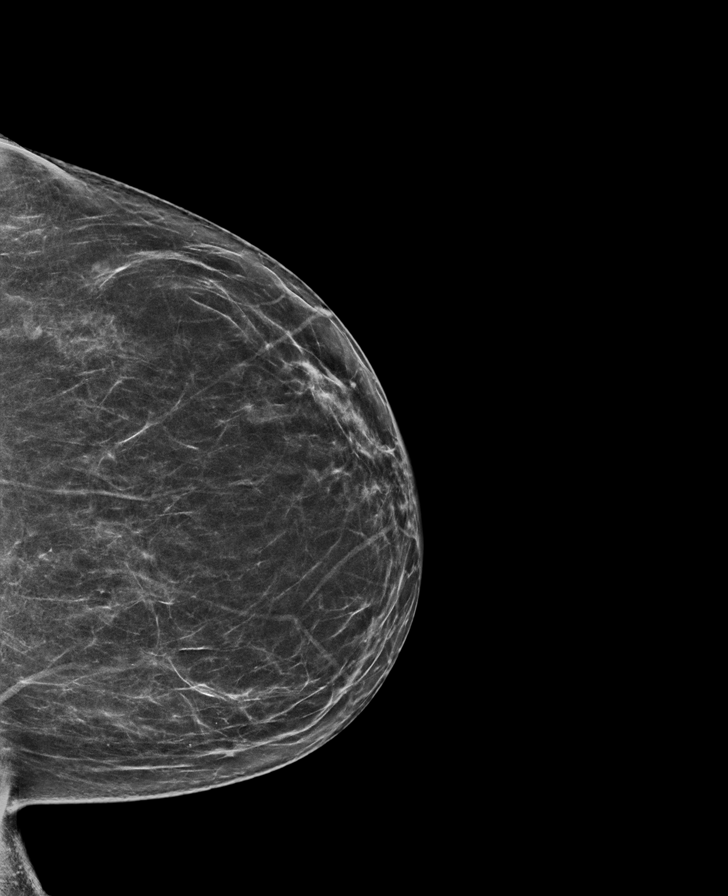

[R CC tomo · tomo slice 34/67.0]
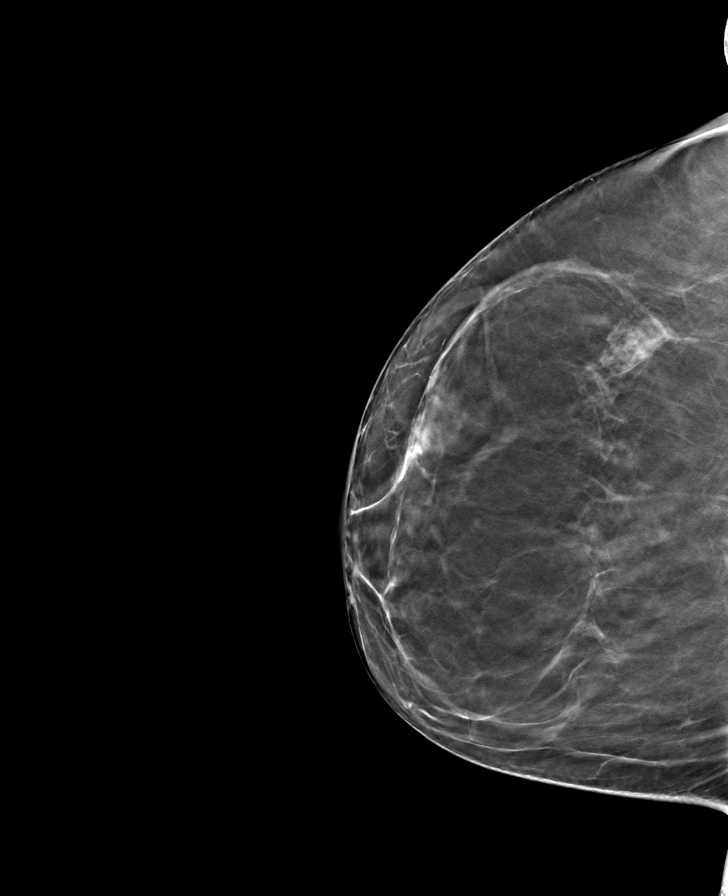

[L MLO tomo · tomo slice 35/69.0]
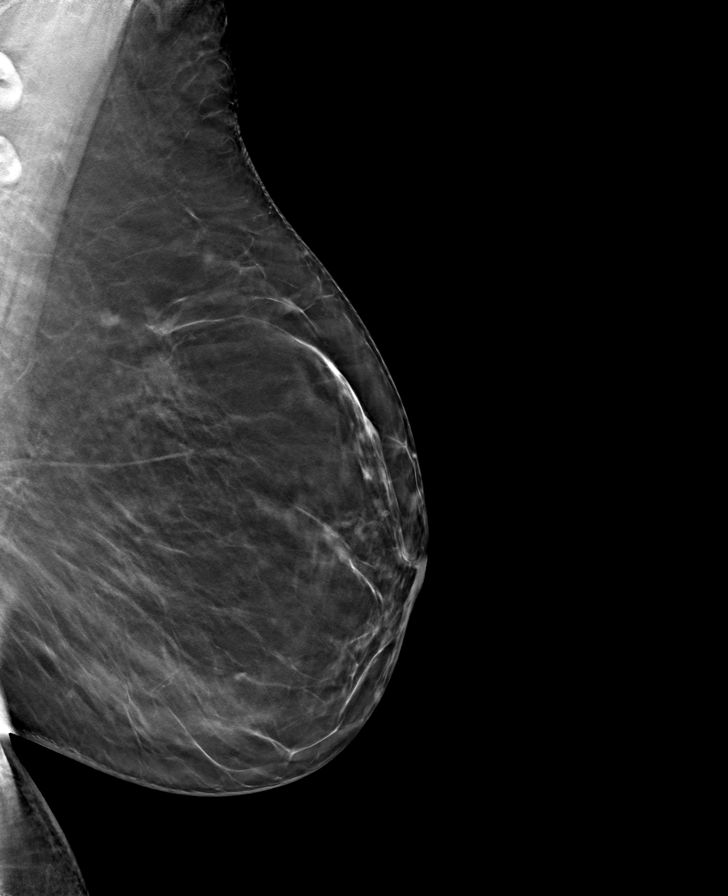

[L CC tomo · tomo slice 33/64.0]
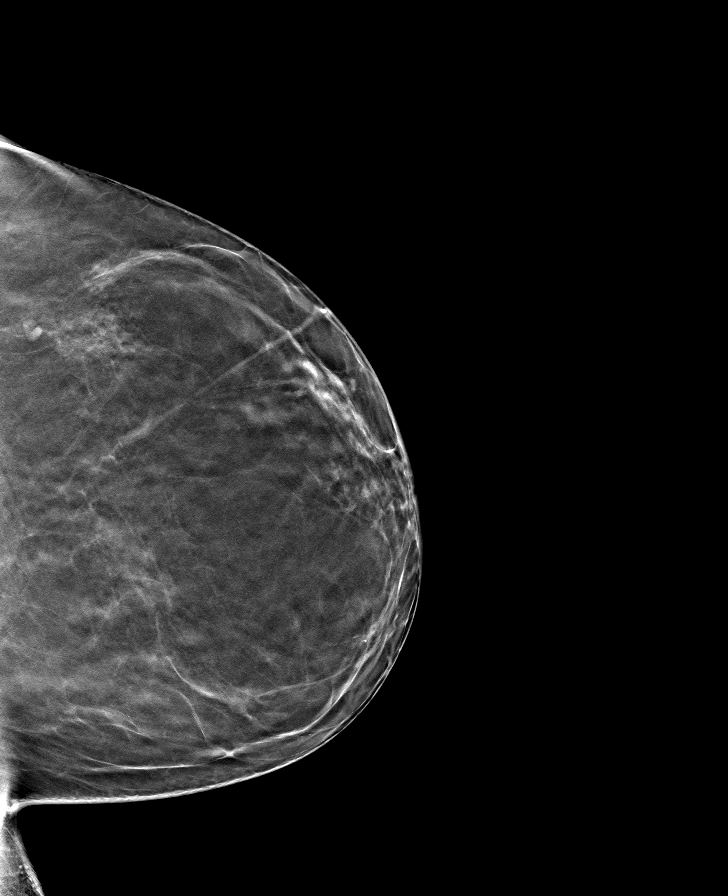

[R MLO tomo · tomo slice 33/64.0]
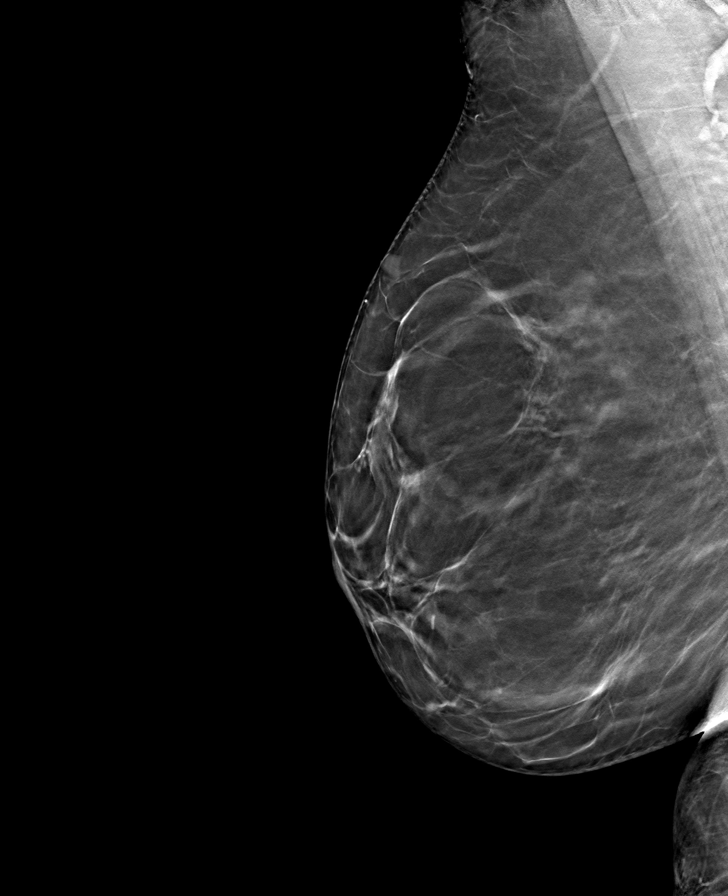

[8 of 24 positions shown; findings below may reference images not displayed]

ACR Breast Density Category b: There are scattered areas of
fibroglandular density.
FINDINGS: There are no findings suspicious for malignancy.
IMPRESSION: No mammographic evidence of malignancy. A result letter of this
screening mammogram will be mailed directly to the patient.

RECOMMENDATION:
Screening mammogram in one year. (Code:51-O-LD2)

BI-RADS CATEGORY  1: Negative.

## 2023-09-13 DIAGNOSIS — M79642 Pain in left hand: Secondary | ICD-10-CM | POA: Diagnosis not present

## 2023-09-13 DIAGNOSIS — Z6828 Body mass index (BMI) 28.0-28.9, adult: Secondary | ICD-10-CM | POA: Diagnosis not present

## 2023-09-13 DIAGNOSIS — L409 Psoriasis, unspecified: Secondary | ICD-10-CM | POA: Diagnosis not present

## 2023-09-13 DIAGNOSIS — E663 Overweight: Secondary | ICD-10-CM | POA: Diagnosis not present

## 2023-09-13 DIAGNOSIS — M1991 Primary osteoarthritis, unspecified site: Secondary | ICD-10-CM | POA: Diagnosis not present

## 2023-09-13 DIAGNOSIS — M545 Low back pain, unspecified: Secondary | ICD-10-CM | POA: Diagnosis not present

## 2023-09-13 DIAGNOSIS — M79641 Pain in right hand: Secondary | ICD-10-CM | POA: Diagnosis not present

## 2023-10-22 DIAGNOSIS — S42291A Other displaced fracture of upper end of right humerus, initial encounter for closed fracture: Secondary | ICD-10-CM | POA: Diagnosis not present

## 2023-10-24 ENCOUNTER — Encounter (HOSPITAL_COMMUNITY): Payer: Self-pay

## 2023-10-24 DIAGNOSIS — M25511 Pain in right shoulder: Secondary | ICD-10-CM | POA: Diagnosis not present

## 2023-10-24 DIAGNOSIS — S42201A Unspecified fracture of upper end of right humerus, initial encounter for closed fracture: Secondary | ICD-10-CM | POA: Diagnosis not present

## 2023-10-24 NOTE — H&P (Signed)
Patient's anticipated LOS is less than 2 midnights, meeting these requirements: - Younger than 80 - Lives within 1 hour of care - Has a competent adult at home to recover with post-op recover - NO history of  - Chronic pain requiring opiods  - Diabetes  - Coronary Artery Disease  - Heart failure  - Heart attack  - Stroke  - DVT/VTE  - Cardiac arrhythmia  - Respiratory Failure/COPD  - Renal failure  - Anemia  - Advanced Liver disease     Kimberly Hartman is an 73 y.o. female.    Chief Complaint: right shoulder pain  HPI: Pt is a 73 y.o. female complaining of right shoulder pain after recent fall and fracture. Pain had continually increased since the beginning. X-rays in the clinic show displaced right proximal humerus fracture. Various options are discussed with the patient. Risks, benefits and expectations were discussed with the patient. Patient understand the risks, benefits and expectations and wishes to proceed with surgery.   PCP:  Joaquim Nam, MD  D/C Plans: Home  PMH: Past Medical History:  Diagnosis Date   Allergy    Anxiety    Arthritis    Cataract    removed both , lens implants   COPD (chronic obstructive pulmonary disease) (HCC)    Diabetes mellitus without complication (HCC)    Psoriasis    Thyroid disease     PSH: Past Surgical History:  Procedure Laterality Date   ABDOMINAL HYSTERECTOMY  2001   ABDOMINAL SURGERY     ADENOIDECTOMY  1956   APPENDECTOMY  1967   BREAST EXCISIONAL BIOPSY Right    benign   COLONOSCOPY     DENTAL SURGERY     EYE SURGERY  2018   Cataract   KNEE ARTHROSCOPY WITH MENISCAL REPAIR Right    x2   POLYPECTOMY     TRIGGER FINGER RELEASE Left 08/16/2023   Procedure: LEFT INDEX AND MIDDLE FINGER RELEASE TRIGGER FINGER/A-1 PULLEY;  Surgeon: Marlyne Beards, MD;  Location: [facility];  Service: Orthopedics;  Laterality: Left;  MAC AND LOCAL   TRIGGER FINGER RELEASE  08/16/2023   L middle finger and Index  finger    Social History:  reports that she has been smoking cigarettes. She has a 50 pack-year smoking history. She has never used smokeless tobacco. She reports current alcohol use. She reports that she does not use drugs. BMI: There is no height or weight on file to calculate BMI.  Lab Results  Component Value Date   ALBUMIN 3.7 11/10/2022   Diabetes:   Patient has a diagnosis of diabetes,  Lab Results  Component Value Date   HGBA1C 7.7 (H) 11/10/2022   Smoking Status: Continues to smoke occasionally         Allergies:  Allergies  Allergen Reactions   Chantix [Varenicline Tartrate]    Glimepiride     hypoglycemia   Metformin And Related    Reclast [Zoledronic Acid]     Aches, joint pain   Neo-Bacit-Poly-Lidocaine Itching, Swelling, Dermatitis and Rash    She can tolerate bacitracin.     Penicillins Rash    TOLERATED KEFLEX WITH NO ISSUES   Tape Dermatitis and Rash    THE ADHESIVE PART NOT THE ACTUAL TAPE   Tetracyclines & Related Rash   Wellbutrin [Bupropion] Rash    Medications: No current facility-administered medications for this encounter.   Current Outpatient Medications  Medication Sig Dispense Refill   acetaminophen (TYLENOL) 325 MG tablet Take  650 mg by mouth every 6 (six) hours as needed.     atorvastatin (LIPITOR) 40 MG tablet Take 1 tablet (40 mg total) by mouth daily. 90 tablet 2   Calcium Carbonate-Vitamin D (CALCIUM-D) 600-400 MG-UNIT TABS Take 2 tablets by mouth daily.     cetirizine (ZYRTEC) 10 MG tablet Take 10 mg by mouth daily as needed for allergies.     Cholecalciferol (VITAMIN D3) 1000 units CAPS Take 1 capsule by mouth daily.     clobetasol cream (TEMOVATE) 0.05 % Apply 1 Application topically 2 (two) times daily. Apply to red area on back until it resolves, then stop. Use for up to two weeks and then take two weeks off before restarting 60 g 0   diphenhydrAMINE (BENADRYL) 25 MG tablet Take 25 mg by mouth daily as needed.     glucose  blood (ONE TOUCH ULTRA TEST) test strip Use to check blood sugar once a day. 50 each 3   hydrocortisone 2.5 % cream Apply topically 2 (two) times daily. Apply to rash between the breast BID prn x up to 2 weeks. 28 g 0   ibuprofen (ADVIL) 200 MG tablet Take 200 mg by mouth every 6 (six) hours as needed.     levothyroxine (SYNTHROID) 100 MCG tablet Take 1 tablet (100 mcg total) by mouth every morning. 90 tablet 3   Multiple Vitamins-Minerals (MULTIVITAMIN ADULTS 50+) TABS Take 1 tablet by mouth daily.     pioglitazone (ACTOS) 45 MG tablet Take 45 mg by mouth daily.     Secukinumab (COSENTYX SENSOREADY, 300 MG, [place]) Inject 300 mg into the skin.     Semaglutide,0.25 or 0.5MG /DOS, (OZEMPIC, 0.25 OR 0.5 MG/DOSE,) 2 MG/1.5ML SOPN Inject into the skin. As directed     sertraline (ZOLOFT) 50 MG tablet TAKE 1 TABLET BY MOUTH DAILY 90 tablet 1   triamcinolone cream (KENALOG) 0.1 % Apply 1 Application topically 2 (two) times daily as needed. 45 g 1    No results found for this or any previous visit (from the past 48 hours). No results found.  ROS: Pain with rom of the right upper extremity  Physical Exam: Alert and oriented 73 y.o. female in no acute distress Cranial nerves 2-12 intact Cervical spine: full rom with no tenderness, nv intact distally Chest: active breath sounds bilaterally, no wheeze rhonchi or rales Heart: regular rate and rhythm, no murmur Abd: non tender non distended with active bowel sounds Hip is stable with rom  Right shoulder with edema and ecchymosis Nv intact distally Limited rom due to fracture  Assessment/Plan Assessment: right proximal humerus fracture with displacement  Plan:  Patient will undergo a right reverse total shoulder by Dr. Ranell Patrick at [place] Risks benefits and expectations were discussed with the patient. Patient understand risks, benefits and expectations and wishes to proceed. Preoperative templating of the joint replacement has been completed,  documented, and submitted to the Operating Room personnel in order to optimize intra-operative equipment management.   Kimberly Overall PA-C, MPAS [facility] is now [employer] [address], [address], [place], [place] [address] Phone: [phone] [email or web address] Facebook  [employer]

## 2023-10-24 NOTE — Patient Instructions (Signed)
SURGICAL WAITING ROOM VISITATION Patients having surgery or a procedure may have no more than 2 support people in the waiting area - these visitors may rotate.    Children under the age of 43 must have an adult with them who is not the patient.  Due to an increase in RSV and influenza rates and associated hospitalizations, children ages 69 and under may not visit patients in [facility] hospitals.   If the patient needs to stay at the hospital during part of their recovery, the visitor guidelines for inpatient rooms apply. Pre-op nurse will coordinate an appropriate time for 1 support person to accompany patient in pre-op.  This support person may not rotate.    Please refer to the [facility] website for the visitor guidelines for Inpatients (after your surgery is over and you are in a regular room).       Your procedure is scheduled on: 11-03-23   Report to [facility] Main Entrance    Report to admitting at 11:30 AM   Call this number if you have problems the morning of surgery [phone]   Do not eat food :After Midnight.   After Midnight you may have the following liquids until 11:00 AM DAY OF SURGERY  Water Non-Citrus Juices (without pulp, NO RED-Apple, White grape, White cranberry) Black Coffee (NO MILK/CREAM OR CREAMERS, sugar ok)  Clear Tea (NO MILK/CREAM OR CREAMERS, sugar ok) regular and decaf                             Plain Jell-O (NO RED)                                           Fruit ices (not with fruit pulp, NO RED)                                     Popsicles (NO RED)                                                               Sports drinks like Gatorade (NO RED)                   The day of surgery:  Drink ONE (1) Pre-Surgery G2 at 11:00 AM the morning of surgery. Drink in one sitting. Do not sip.  This drink was given to you during your hospital  pre-op appointment visit. Nothing else to drink after completing the Pre-Surgery G2.           If you have questions, please contact your surgeon's office.   FOLLOW  ANY ADDITIONAL PRE OP INSTRUCTIONS YOU RECEIVED FROM YOUR SURGEON'S OFFICE!!!     Oral Hygiene is also important to reduce your risk of infection.                                    Remember - BRUSH YOUR TEETH THE MORNING OF SURGERY WITH YOUR REGULAR TOOTHPASTE   Do  NOT smoke after Midnight   Take these medicines the morning of surgery with A SIP OF WATER:   Atorvastatin  Zyrtec  Levothyroxine  Sertraline  Percocet or Tylenol if needed  Stop all vitamins and herbal supplements 7 days before surgery  How to Manage Your Diabetes Before and After Surgery  Why is it important to control my blood sugar before and after surgery? Improving blood sugar levels before and after surgery helps healing and can limit problems. A way of improving blood sugar control is eating a healthy diet by:  Eating less sugar and carbohydrates  Increasing activity/exercise  Talking with your doctor about reaching your blood sugar goals High blood sugars (greater than 180 mg/dL) can raise your risk of infections and slow your recovery, so you will need to focus on controlling your diabetes during the weeks before surgery. Make sure that the doctor who takes care of your diabetes knows about your planned surgery including the date and location.  How do I manage my blood sugar before surgery? Check your blood sugar at least 4 times a day, starting 2 days before surgery, to make sure that the level is not too high or low. Check your blood sugar the morning of your surgery when you wake up and every 2 hours until you get to the Short Stay unit. If your blood sugar is less than 70 mg/dL, you will need to treat for low blood sugar: Do not take insulin. Treat a low blood sugar (less than 70 mg/dL) with  cup of clear juice (cranberry or apple), 4 glucose tablets, OR glucose gel. Recheck blood sugar in 15 minutes after treatment (to make sure it  is greater than 70 mg/dL). If your blood sugar is not greater than 70 mg/dL on recheck, call [phone] for further instructions. Report your blood sugar to the short stay nurse when you get to Short Stay.  If you are admitted to the hospital after surgery: Your blood sugar will be checked by the staff and you will probably be given insulin after surgery (instead of oral diabetes medicines) to make sure you have good blood sugar levels. The goal for blood sugar control after surgery is 80-180 mg/dL.   WHAT DO I DO ABOUT MY DIABETES MEDICATION?  Do not take oral diabetes medicines (pills) the morning of surgery.  Hold Ozempic 7 days before surgery (do not take after 10-26-23)  DO NOT TAKE THE FOLLOWING 7 DAYS PRIOR TO SURGERY: Ozempic, Wegovy, Rybelsus (Semaglutide), Byetta (exenatide), Bydureon (exenatide ER), Victoza, Saxenda (liraglutide), or Trulicity (dulaglutide) Mounjaro (Tirzepatide) Adlyxin (Lixisenatide), Polyethylene Glycol Loxenatide.  Reviewed and Endorsed by [facility] Patient Education Committee, August 2015  Bring CPAP mask and tubing day of surgery.                              You may not have any metal on your body including hair pins, jewelry, and body piercing             Do not wear make-up, lotions, powders, perfumes or deodorant  Do not wear nail polish including gel and S&S, artificial/acrylic nails, or any other type of covering on natural nails including finger and toenails. If you have artificial nails, gel coating, etc. that needs to be removed by a nail salon please have this removed prior to surgery or surgery may need to be canceled/ delayed if the surgeon/ anesthesia feels like they are unable to  be safely monitored.   Do not shave  48 hours prior to surgery.    Do not bring valuables to the hospital. [facility] IS NOT RESPONSIBLE   FOR VALUABLES.   Contacts, dentures or bridgework may not be worn into surgery.   Bring small overnight bag day of  surgery.   DO NOT BRING YOUR HOME MEDICATIONS TO THE HOSPITAL. PHARMACY WILL DISPENSE MEDICATIONS LISTED ON YOUR MEDICATION LIST TO YOU DURING YOUR ADMISSION IN THE HOSPITAL!    Special Instructions: Bring a copy of your healthcare power of attorney and living will documents the day of surgery if you haven't scanned them before.              Please read over the following fact sheets you were given: IF YOU HAVE QUESTIONS ABOUT YOUR PRE-OP INSTRUCTIONS PLEASE CALL [phone] [place]  If you received a COVID test during your pre-op visit  it is requested that you wear a mask when out in public, stay away from anyone that may not be feeling well and notify your surgeon if you develop symptoms. If you test positive for Covid or have been in contact with anyone that has tested positive in the last 10 days please notify you surgeon.  [place]- Preparing for Total Shoulder Arthroplasty    Before surgery, you can play an important role. Because skin is not sterile, your skin needs to be as free of germs as possible. You can reduce the number of germs on your skin by using the following products. Benzoyl Peroxide Gel Reduces the number of germs present on the skin Applied twice a day to shoulder area starting two days before surgery    ==================================================================  Please follow these instructions carefully:  BENZOYL PEROXIDE 5% GEL  Please do not use if you have an allergy to benzoyl peroxide.   If your skin becomes reddened/irritated stop using the benzoyl peroxide.  Starting two days before surgery, apply as follows: Apply benzoyl peroxide in the morning and at night. Apply after taking a shower. If you are not taking a shower clean entire shoulder front, back, and side along with the armpit with a clean wet washcloth.  Place a quarter-sized dollop on your shoulder and rub in thoroughly, making sure to cover the front, back, and side of your shoulder,  along with the armpit.   2 days before ____ AM   ____ PM              1 day before ____ AM   ____ PM                         Do this twice a day for two days.  (Last application is the night before surgery, AFTER using the CHG soap as described below).  Do NOT apply benzoyl peroxide gel on the day of surgery.     Pre-operative 5 CHG Bath Instructions   You can play a key role in reducing the risk of infection after surgery. Your skin needs to be as free of germs as possible. You can reduce the number of germs on your skin by washing with CHG (chlorhexidine gluconate) soap before surgery. CHG is an antiseptic soap that kills germs and continues to kill germs even after washing.   DO NOT use if you have an allergy to chlorhexidine/CHG or antibacterial soaps. If your skin becomes reddened or irritated, stop using the CHG and notify one of our RNs at  [phone].   Please shower with the CHG soap starting 4 days before surgery using the following schedule:     Please keep in mind the following:  DO NOT shave, including legs and underarms, starting the day of your first shower.   You may shave your face at any point before/day of surgery.  Place clean sheets on your bed the day you start using CHG soap. Use a clean washcloth (not used since being washed) for each shower. DO NOT sleep with pets once you start using the CHG.   CHG Shower Instructions:  If you choose to wash your hair and private area, wash first with your normal shampoo/soap.  After you use shampoo/soap, rinse your hair and body thoroughly to remove shampoo/soap residue.  Turn the water OFF and apply about 3 tablespoons (45 ml) of CHG soap to a CLEAN washcloth.  Apply CHG soap ONLY FROM YOUR NECK DOWN TO YOUR TOES (washing for 3-5 minutes)  DO NOT use CHG soap on face, private areas, open wounds, or sores.  Pay special attention to the area where your surgery is being performed.  If you are having back surgery, having  someone wash your back for you may be helpful. Wait 2 minutes after CHG soap is applied, then you may rinse off the CHG soap.  Pat dry with a clean towel  Put on clean clothes/pajamas   If you choose to wear lotion, please use ONLY the CHG-compatible lotions on the back of this paper.     Additional instructions for the day of surgery: DO NOT APPLY any lotions, deodorants, cologne, or perfumes.   Put on clean/comfortable clothes.  Brush your teeth.  Ask your nurse before applying any prescription medications to the skin.      CHG Compatible Lotions   Aveeno Moisturizing lotion  Cetaphil Moisturizing Cream  Cetaphil Moisturizing Lotion  Clairol Herbal Essence Moisturizing Lotion, Dry Skin  Clairol Herbal Essence Moisturizing Lotion, Extra Dry Skin  Clairol Herbal Essence Moisturizing Lotion, Normal Skin  Curel Age Defying Therapeutic Moisturizing Lotion with Alpha Hydroxy  Curel Extreme Care Body Lotion  Curel Soothing Hands Moisturizing Hand Lotion  Curel Therapeutic Moisturizing Cream, Fragrance-Free  Curel Therapeutic Moisturizing Lotion, Fragrance-Free  Curel Therapeutic Moisturizing Lotion, Original Formula  Eucerin Daily Replenishing Lotion  Eucerin Dry Skin Therapy Plus Alpha Hydroxy Crme  Eucerin Dry Skin Therapy Plus Alpha Hydroxy Lotion  Eucerin Original Crme  Eucerin Original Lotion  Eucerin Plus Crme Eucerin Plus Lotion  Eucerin TriLipid Replenishing Lotion  Keri Anti-Bacterial Hand Lotion  Keri Deep Conditioning Original Lotion Dry Skin Formula Softly Scented  Keri Deep Conditioning Original Lotion, Fragrance Free Sensitive Skin Formula  Keri Lotion Fast Absorbing Fragrance Free Sensitive Skin Formula  Keri Lotion Fast Absorbing Softly Scented Dry Skin Formula  Keri Original Lotion  Keri Skin Renewal Lotion Keri Silky Smooth Lotion  Keri Silky Smooth Sensitive Skin Lotion  Nivea Body Creamy Conditioning Oil  Nivea Body Extra Enriched Lotion  Nivea Body  Original Lotion  Nivea Body Sheer Moisturizing Lotion Nivea Crme  Nivea Skin Firming Lotion  NutraDerm 30 Skin Lotion  NutraDerm Skin Lotion  NutraDerm Therapeutic Skin Cream  NutraDerm Therapeutic Skin Lotion  ProShield Protective Hand Cream  Provon moisturizing lotion   PATIENT SIGNATURE_________________________________  NURSE SIGNATURE__________________________________  ________________________________________________________________________    Kimberly Hartman  An incentive spirometer is a tool that can help keep your lungs clear and active. This tool measures how well you are filling your lungs with each breath.  Taking long deep breaths may help reverse or decrease the chance of developing breathing (pulmonary) problems (especially infection) following: A long period of time when you are unable to move or be active. BEFORE THE PROCEDURE  If the spirometer includes an indicator to show your best effort, your nurse or respiratory therapist will set it to a desired goal. If possible, sit up straight or lean slightly forward. Try not to slouch. Hold the incentive spirometer in an upright position. INSTRUCTIONS FOR USE  Sit on the edge of your bed if possible, or sit up as far as you can in bed or on a chair. Hold the incentive spirometer in an upright position. Breathe out normally. Place the mouthpiece in your mouth and seal your lips tightly around it. Breathe in slowly and as deeply as possible, raising the piston or the ball toward the top of the column. Hold your breath for 3-5 seconds or for as long as possible. Allow the piston or ball to fall to the bottom of the column. Remove the mouthpiece from your mouth and breathe out normally. Rest for a few seconds and repeat Steps 1 through 7 at least 10 times every 1-2 hours when you are awake. Take your time and take a few normal breaths between deep breaths. The spirometer may include an indicator to show your best effort.  Use the indicator as a goal to work toward during each repetition. After each set of 10 deep breaths, practice coughing to be sure your lungs are clear. If you have an incision (the cut made at the time of surgery), support your incision when coughing by placing a pillow or rolled up towels firmly against it. Once you are able to get out of bed, walk around indoors and cough well. You may stop using the incentive spirometer when instructed by your caregiver.  RISKS AND COMPLICATIONS Take your time so you do not get dizzy or light-headed. If you are in pain, you may need to take or ask for pain medication before doing incentive spirometry. It is harder to take a deep breath if you are having pain. AFTER USE Rest and breathe slowly and easily. It can be helpful to keep track of a log of your progress. Your caregiver can provide you with a simple table to help with this. If you are using the spirometer at home, follow these instructions: SEEK MEDICAL CARE IF:  You are having difficultly using the spirometer. You have trouble using the spirometer as often as instructed. Your pain medication is not giving enough relief while using the spirometer. You develop fever of 100.5 F (38.1 C) or higher. SEEK IMMEDIATE MEDICAL CARE IF:  You cough up bloody sputum that had not been present before. You develop fever of 102 F (38.9 C) or greater. You develop worsening pain at or near the incision site. MAKE SURE YOU:  Understand these instructions. Will watch your condition. Will get help right away if you are not doing well or get worse. Document Released: 01/30/2007 Document Revised: 12/12/2011 Document Reviewed: 04/02/2007 [facility] Patient Information 2014 [place], [place].   ________________________________________________________________________

## 2023-10-24 NOTE — Progress Notes (Signed)
COVID Vaccine Completed:  Yes  Date of COVID positive in last 90 days:  PCP - Crawford Givens, MD Cardiologist -  Endocrinologist - Verdis Frederickson, MD  Chest x-ray - 11-04-22 Epic EKG -  Stress Test -  ECHO -  Cardiac Cath -  Pacemaker/ICD device last checked: Spinal Cord Stimulator:  Bowel Prep -   Sleep Study - Yes, +sleep apnea CPAP -   Fasting Blood Sugar -  Checks Blood Sugar _____ times a day  Ozempic Last dose of GLP1 agonist-  10-26-23 GLP1 instructions:  Hold 7 days before surgery    Last dose of SGLT-2 inhibitors-  N/A SGLT-2 instructions:  Hold 3 days before surgery    Blood Thinner Instructions:  Time Aspirin Instructions: Last Dose:  Activity level:  Can go up a flight of stairs and perform activities of daily living without stopping and without symptoms of chest pain or shortness of breath.  Able to exercise without symptoms  Unable to go up a flight of stairs without symptoms of     Anesthesia review:  COPD  Patient denies shortness of breath, fever, cough and chest pain at PAT appointment  Patient verbalized understanding of instructions that were given to them at the PAT appointment. Patient was also instructed that they will need to review over the PAT instructions again at home before surgery.

## 2023-10-26 ENCOUNTER — Encounter (HOSPITAL_COMMUNITY)
Admission: RE | Admit: 2023-10-26 | Discharge: 2023-10-26 | Disposition: A | Discharge status: Home / Self Care | Payer: Medicare Other | Source: Ambulatory Visit | Attending: Orthopedic Surgery | Admitting: Orthopedic Surgery

## 2023-10-26 ENCOUNTER — Encounter (HOSPITAL_COMMUNITY): Payer: Self-pay

## 2023-10-26 ENCOUNTER — Other Ambulatory Visit: Payer: Self-pay

## 2023-10-26 VITALS — BP 131/64 | HR 72 | Temp 98.6°F | Resp 16

## 2023-10-26 DIAGNOSIS — Z01818 Encounter for other preprocedural examination: Secondary | ICD-10-CM

## 2023-10-26 DIAGNOSIS — E119 Type 2 diabetes mellitus without complications: Secondary | ICD-10-CM | POA: Insufficient documentation

## 2023-10-26 DIAGNOSIS — Z01812 Encounter for preprocedural laboratory examination: Secondary | ICD-10-CM | POA: Diagnosis not present

## 2023-10-26 HISTORY — DX: Hypothyroidism, unspecified: E03.9

## 2023-10-26 HISTORY — DX: Depression, unspecified: F32.A

## 2023-10-26 LAB — BASIC METABOLIC PANEL
Anion gap: 10 (ref 5–15)
BUN: 17 mg/dL (ref 8–23)
CO2: 27 mmol/L (ref 22–32)
Calcium: 8.9 mg/dL (ref 8.9–10.3)
Chloride: 101 mmol/L (ref 98–111)
Creatinine, Ser: 0.55 mg/dL (ref 0.44–1.00)
GFR, Estimated: >60 mL/min (ref >60)
Glucose, Bld: 106 mg/dL — ABNORMAL HIGH (ref 70–99)
Potassium: 3.8 mmol/L (ref 3.5–5.1)
Sodium: 138 mmol/L (ref 135–145)

## 2023-10-26 LAB — CBC
HCT: 35.9 % — ABNORMAL LOW (ref 36.0–46.0)
Hemoglobin: 11.2 g/dL — ABNORMAL LOW (ref 12.0–15.0)
MCH: 28.6 pg (ref 26.0–34.0)
MCHC: 31.2 g/dL (ref 30.0–36.0)
MCV: 91.6 fL (ref 80.0–100.0)
Platelets: 331 10*3/uL (ref 150–400)
RBC: 3.92 MIL/uL (ref 3.87–5.11)
RDW: 14.3 % (ref 11.5–15.5)
WBC: 10.8 10*3/uL — ABNORMAL HIGH (ref 4.0–10.5)
nRBC: 0 % (ref 0.0–0.2)

## 2023-10-26 LAB — GLUCOSE, CAPILLARY: Glucose-Capillary: 94 mg/dL (ref 70–99)

## 2023-10-26 LAB — SURGICAL PCR SCREEN
MRSA, PCR: NEGATIVE
Staphylococcus aureus: NEGATIVE

## 2023-10-26 LAB — HEMOGLOBIN A1C
Hgb A1c MFr Bld: 5.9 % — ABNORMAL HIGH (ref 4.8–5.6)
Mean Plasma Glucose: 122.63 mg/dL

## 2023-11-03 ENCOUNTER — Other Ambulatory Visit: Payer: Self-pay

## 2023-11-03 ENCOUNTER — Encounter (HOSPITAL_COMMUNITY)
Admission: RE | Disposition: A | Discharge status: Home / Self Care | Payer: Self-pay | Source: Home / Self Care | Attending: Orthopedic Surgery

## 2023-11-03 ENCOUNTER — Ambulatory Visit (HOSPITAL_COMMUNITY): Payer: Medicare Other | Admitting: Physician Assistant

## 2023-11-03 ENCOUNTER — Ambulatory Visit (HOSPITAL_COMMUNITY): Payer: Medicare Other | Admitting: Certified Registered Nurse Anesthetist

## 2023-11-03 ENCOUNTER — Observation Stay (HOSPITAL_COMMUNITY)
Admission: RE | Admit: 2023-11-03 | Discharge: 2023-11-04 | Disposition: A | Discharge status: Home / Self Care | Payer: Medicare Other | Attending: Orthopedic Surgery | Admitting: Orthopedic Surgery

## 2023-11-03 ENCOUNTER — Encounter (HOSPITAL_COMMUNITY): Payer: Self-pay | Admitting: Orthopedic Surgery

## 2023-11-03 ENCOUNTER — Observation Stay (HOSPITAL_COMMUNITY): Payer: Medicare Other

## 2023-11-03 DIAGNOSIS — E039 Hypothyroidism, unspecified: Secondary | ICD-10-CM

## 2023-11-03 DIAGNOSIS — J449 Chronic obstructive pulmonary disease, unspecified: Secondary | ICD-10-CM | POA: Insufficient documentation

## 2023-11-03 DIAGNOSIS — W19XXXA Unspecified fall, initial encounter: Secondary | ICD-10-CM | POA: Diagnosis not present

## 2023-11-03 DIAGNOSIS — Z96611 Presence of right artificial shoulder joint: Secondary | ICD-10-CM | POA: Diagnosis not present

## 2023-11-03 DIAGNOSIS — S42201A Unspecified fracture of upper end of right humerus, initial encounter for closed fracture: Secondary | ICD-10-CM

## 2023-11-03 DIAGNOSIS — Z79899 Other long term (current) drug therapy: Secondary | ICD-10-CM | POA: Diagnosis not present

## 2023-11-03 DIAGNOSIS — G8918 Other acute postprocedural pain: Secondary | ICD-10-CM | POA: Diagnosis not present

## 2023-11-03 DIAGNOSIS — S42291A Other displaced fracture of upper end of right humerus, initial encounter for closed fracture: Secondary | ICD-10-CM | POA: Diagnosis not present

## 2023-11-03 DIAGNOSIS — E119 Type 2 diabetes mellitus without complications: Secondary | ICD-10-CM | POA: Diagnosis not present

## 2023-11-03 DIAGNOSIS — F17210 Nicotine dependence, cigarettes, uncomplicated: Secondary | ICD-10-CM | POA: Diagnosis not present

## 2023-11-03 DIAGNOSIS — S42251A Displaced fracture of greater tuberosity of right humerus, initial encounter for closed fracture: Secondary | ICD-10-CM | POA: Diagnosis not present

## 2023-11-03 DIAGNOSIS — E785 Hyperlipidemia, unspecified: Secondary | ICD-10-CM | POA: Diagnosis not present

## 2023-11-03 DIAGNOSIS — S42301A Unspecified fracture of shaft of humerus, right arm, initial encounter for closed fracture: Secondary | ICD-10-CM | POA: Diagnosis not present

## 2023-11-03 DIAGNOSIS — Z471 Aftercare following joint replacement surgery: Secondary | ICD-10-CM | POA: Diagnosis not present

## 2023-11-03 HISTORY — PX: REVERSE SHOULDER ARTHROPLASTY: SHX5054

## 2023-11-03 LAB — GLUCOSE, CAPILLARY
Glucose-Capillary: 98 mg/dL (ref 70–99)
Glucose-Capillary: 123 mg/dL — ABNORMAL HIGH (ref 70–99)
Glucose-Capillary: 269 mg/dL — ABNORMAL HIGH (ref 70–99)

## 2023-11-03 SURGERY — ARTHROPLASTY, SHOULDER, TOTAL, REVERSE
Anesthesia: General | Site: Shoulder | Laterality: Right

## 2023-11-03 MED ORDER — EPHEDRINE SULFATE-NACL 50-0.9 MG/10ML-% IV SOSY
PREFILLED_SYRINGE | INTRAVENOUS | Status: DC | PRN
  Administered 2023-11-03: 10 mg via INTRAVENOUS
  Administered 2023-11-03: 5 mg via INTRAVENOUS

## 2023-11-03 MED ORDER — SECUKINUMAB (300 MG DOSE) 150 MG/ML ~~LOC~~ SOAJ
300.0000 mg | SUBCUTANEOUS | Status: DC
Start: 2023-11-11 — End: 2023-11-04

## 2023-11-03 MED ORDER — MULTIVITAMIN ADULTS 50+ PO TABS: 1.0000 | ORAL_TABLET | Freq: Every day | ORAL | Status: DC

## 2023-11-03 MED ORDER — ONDANSETRON HCL 4 MG/2ML IJ SOLN
INTRAMUSCULAR | Status: DC | PRN
  Administered 2023-11-03: 4 mg via INTRAVENOUS

## 2023-11-03 MED ORDER — DOCUSATE SODIUM 100 MG PO CAPS
100.0000 mg | ORAL_CAPSULE | Freq: Two times a day (BID) | ORAL | Status: DC
  Administered 2023-11-03 – 2023-11-04 (×2): 100 mg via ORAL
  Filled 2023-11-03 (×2): qty 1

## 2023-11-03 MED ORDER — METOCLOPRAMIDE HCL 5 MG PO TABS: 5.0000 mg | ORAL_TABLET | Freq: Three times a day (TID) | ORAL | Status: DC | PRN

## 2023-11-03 MED ORDER — CALCIUM-D 600-400 MG-UNIT PO TABS: 1.0000 | ORAL_TABLET | Freq: Two times a day (BID) | ORAL | Status: DC

## 2023-11-03 MED ORDER — BUPIVACAINE HCL (PF) 0.5 % IJ SOLN
INTRAMUSCULAR | Status: DC | PRN
  Administered 2023-11-03: 15 mL via PERINEURAL

## 2023-11-03 MED ORDER — METHOCARBAMOL 500 MG PO TABS
500.0000 mg | ORAL_TABLET | Freq: Four times a day (QID) | ORAL | Status: DC
  Administered 2023-11-03 – 2023-11-04 (×2): 500 mg via ORAL
  Filled 2023-11-03 (×2): qty 1

## 2023-11-03 MED ORDER — ORAL CARE MOUTH RINSE: 15.0000 mL | Freq: Once | OROMUCOSAL | Status: AC

## 2023-11-03 MED ORDER — FENTANYL CITRATE (PF) 100 MCG/2ML IJ SOLN
INTRAMUSCULAR | Status: AC
  Filled 2023-11-03: qty 2

## 2023-11-03 MED ORDER — IBUPROFEN 400 MG PO TABS: 400.0000 mg | ORAL_TABLET | Freq: Four times a day (QID) | ORAL | Status: DC | PRN

## 2023-11-03 MED ORDER — BUPIVACAINE-EPINEPHRINE 0.25% -1:200000 IJ SOLN
INTRAMUSCULAR | Status: AC
  Filled 2023-11-03: qty 1

## 2023-11-03 MED ORDER — LIDOCAINE 2% (20 MG/ML) 5 ML SYRINGE
INTRAMUSCULAR | Status: DC | PRN
  Administered 2023-11-03: 40 mg via INTRAVENOUS

## 2023-11-03 MED ORDER — ONDANSETRON HCL 4 MG/2ML IJ SOLN: 4.0000 mg | Freq: Four times a day (QID) | INTRAMUSCULAR | Status: DC | PRN

## 2023-11-03 MED ORDER — MENTHOL 3 MG MT LOZG: 1.0000 | LOZENGE | OROMUCOSAL | Status: DC | PRN

## 2023-11-03 MED ORDER — TRANEXAMIC ACID-NACL 1000-0.7 MG/100ML-% IV SOLN
1000.0000 mg | INTRAVENOUS | Status: AC
  Administered 2023-11-03: 1000 mg via INTRAVENOUS
  Filled 2023-11-03: qty 100

## 2023-11-03 MED ORDER — LEVOTHYROXINE SODIUM 100 MCG PO TABS
100.0000 ug | ORAL_TABLET | Freq: Every morning | ORAL | Status: DC
Start: 2023-11-04 — End: 2023-11-04
  Administered 2023-11-04: 100 ug via ORAL
  Filled 2023-11-03: qty 1

## 2023-11-03 MED ORDER — INSULIN ASPART 100 UNIT/ML IJ SOLN
0.0000 [IU] | Freq: Every day | INTRAMUSCULAR | Status: DC
  Administered 2023-11-03: 3 [IU] via SUBCUTANEOUS

## 2023-11-03 MED ORDER — PROPOFOL 10 MG/ML IV BOLUS
INTRAVENOUS | Status: AC
  Filled 2023-11-03: qty 20

## 2023-11-03 MED ORDER — IBUPROFEN 200 MG PO TABS: 400.0000 mg | ORAL_TABLET | ORAL | Status: DC

## 2023-11-03 MED ORDER — BUPIVACAINE LIPOSOME 1.3 % IJ SUSP
INTRAMUSCULAR | Status: DC | PRN
  Administered 2023-11-03: 10 mL via PERINEURAL

## 2023-11-03 MED ORDER — ACETAMINOPHEN 500 MG PO TABS
1000.0000 mg | ORAL_TABLET | Freq: Once | ORAL | Status: AC
  Administered 2023-11-03: 1000 mg via ORAL
  Filled 2023-11-03: qty 2

## 2023-11-03 MED ORDER — LACTATED RINGERS IV SOLN: INTRAVENOUS | Status: DC | PRN

## 2023-11-03 MED ORDER — METOCLOPRAMIDE HCL 5 MG/ML IJ SOLN: 5.0000 mg | Freq: Three times a day (TID) | INTRAMUSCULAR | Status: DC | PRN

## 2023-11-03 MED ORDER — LORATADINE 10 MG PO TABS
10.0000 mg | ORAL_TABLET | Freq: Every day | ORAL | Status: DC
Start: 2023-11-04 — End: 2023-11-04
  Administered 2023-11-04: 10 mg via ORAL
  Filled 2023-11-03: qty 1

## 2023-11-03 MED ORDER — ONDANSETRON HCL 4 MG PO TABS
4.0000 mg | ORAL_TABLET | Freq: Four times a day (QID) | ORAL | Status: DC | PRN
Start: 2023-11-03 — End: 2023-11-04

## 2023-11-03 MED ORDER — STERILE WATER FOR IRRIGATION IR SOLN
Status: DC | PRN
  Administered 2023-11-03: 1000 mL

## 2023-11-03 MED ORDER — PHENYLEPHRINE 80 MCG/ML (10ML) SYRINGE FOR IV PUSH (FOR BLOOD PRESSURE SUPPORT)
PREFILLED_SYRINGE | INTRAVENOUS | Status: AC
  Filled 2023-11-03: qty 10

## 2023-11-03 MED ORDER — ONDANSETRON HCL 4 MG PO TABS: 4.0000 mg | ORAL_TABLET | Freq: Three times a day (TID) | ORAL | 1 refills | Status: DC | PRN

## 2023-11-03 MED ORDER — SEMAGLUTIDE(0.25 OR 0.5MG/DOS) 2 MG/1.5ML ~~LOC~~ SOPN: 0.5000 mg | PEN_INJECTOR | SUBCUTANEOUS | Status: DC

## 2023-11-03 MED ORDER — PROPOFOL 10 MG/ML IV BOLUS
INTRAVENOUS | Status: DC | PRN
  Administered 2023-11-03: 30 mg via INTRAVENOUS
  Administered 2023-11-03: 120 mg via INTRAVENOUS

## 2023-11-03 MED ORDER — MIDAZOLAM HCL 2 MG/2ML IJ SOLN: 1.0000 mg | Freq: Once | INTRAMUSCULAR | Status: AC

## 2023-11-03 MED ORDER — PHENYLEPHRINE HCL-NACL 20-0.9 MG/250ML-% IV SOLN
INTRAVENOUS | Status: DC | PRN
  Administered 2023-11-03: 25 ug/min via INTRAVENOUS

## 2023-11-03 MED ORDER — HYDROCORTISONE 2.5 % EX CREA: TOPICAL_CREAM | Freq: Two times a day (BID) | CUTANEOUS | Status: DC

## 2023-11-03 MED ORDER — ROCURONIUM BROMIDE 10 MG/ML (PF) SYRINGE
PREFILLED_SYRINGE | INTRAVENOUS | Status: DC | PRN
  Administered 2023-11-03: 50 mg via INTRAVENOUS

## 2023-11-03 MED ORDER — HYDROMORPHONE HCL 1 MG/ML IJ SOLN: 0.5000 mg | INTRAMUSCULAR | Status: DC | PRN

## 2023-11-03 MED ORDER — EPHEDRINE 5 MG/ML INJ
INTRAVENOUS | Status: AC
  Filled 2023-11-03: qty 5

## 2023-11-03 MED ORDER — ADULT MULTIVITAMIN W/MINERALS CH
1.0000 | ORAL_TABLET | Freq: Every day | ORAL | Status: DC
  Administered 2023-11-04: 1 via ORAL
  Filled 2023-11-03: qty 1

## 2023-11-03 MED ORDER — SUGAMMADEX SODIUM 200 MG/2ML IV SOLN
INTRAVENOUS | Status: DC | PRN
  Administered 2023-11-03: 200 mg via INTRAVENOUS

## 2023-11-03 MED ORDER — VITAMIN D3 50 MCG (2000 UT) PO TABS: 2000.0000 [IU] | ORAL_TABLET | Freq: Every day | ORAL | Status: DC

## 2023-11-03 MED ORDER — POLYETHYLENE GLYCOL 3350 17 G PO PACK: 17.0000 g | PACK | Freq: Every day | ORAL | Status: DC | PRN

## 2023-11-03 MED ORDER — ACETAMINOPHEN 325 MG PO TABS: 650.0000 mg | ORAL_TABLET | Freq: Four times a day (QID) | ORAL | Status: DC | PRN

## 2023-11-03 MED ORDER — 0.9 % SODIUM CHLORIDE (POUR BTL) OPTIME
TOPICAL | Status: DC | PRN
  Administered 2023-11-03: 1000 mL

## 2023-11-03 MED ORDER — SERTRALINE HCL 50 MG PO TABS
50.0000 mg | ORAL_TABLET | Freq: Every day | ORAL | Status: DC
Start: 2023-11-03 — End: 2023-11-04
  Administered 2023-11-03 – 2023-11-04 (×2): 50 mg via ORAL
  Filled 2023-11-03 (×2): qty 1

## 2023-11-03 MED ORDER — FENTANYL CITRATE PF 50 MCG/ML IJ SOSY: 25.0000 ug | PREFILLED_SYRINGE | INTRAMUSCULAR | Status: DC | PRN

## 2023-11-03 MED ORDER — INSULIN ASPART 100 UNIT/ML IJ SOLN
0.0000 [IU] | Freq: Three times a day (TID) | INTRAMUSCULAR | Status: DC
  Administered 2023-11-04: 2 [IU] via SUBCUTANEOUS

## 2023-11-03 MED ORDER — MIDAZOLAM HCL 2 MG/2ML IJ SOLN
INTRAMUSCULAR | Status: AC
  Administered 2023-11-03: 0.5 mg via INTRAVENOUS
  Filled 2023-11-03: qty 2

## 2023-11-03 MED ORDER — FENTANYL CITRATE PF 50 MCG/ML IJ SOSY
50.0000 ug | PREFILLED_SYRINGE | Freq: Once | INTRAMUSCULAR | Status: AC
  Administered 2023-11-03: 50 ug via INTRAVENOUS
  Filled 2023-11-03: qty 2

## 2023-11-03 MED ORDER — SODIUM CHLORIDE 0.9% FLUSH: 3.0000 mL | INTRAVENOUS | Status: DC | PRN

## 2023-11-03 MED ORDER — VITAMIN D 25 MCG (1000 UNIT) PO TABS
2000.0000 [IU] | ORAL_TABLET | Freq: Every day | ORAL | Status: DC
  Administered 2023-11-04: 2000 [IU] via ORAL
  Filled 2023-11-03: qty 2

## 2023-11-03 MED ORDER — PIOGLITAZONE HCL 15 MG PO TABS
45.0000 mg | ORAL_TABLET | Freq: Every day | ORAL | Status: DC
  Administered 2023-11-04: 45 mg via ORAL
  Filled 2023-11-03: qty 3

## 2023-11-03 MED ORDER — PHENOL 1.4 % MT LIQD: 1.0000 | OROMUCOSAL | Status: DC | PRN

## 2023-11-03 MED ORDER — SODIUM CHLORIDE 0.9% FLUSH: 3.0000 mL | Freq: Two times a day (BID) | INTRAVENOUS | Status: DC

## 2023-11-03 MED ORDER — PERCOCET 5-325 MG PO TABS: 1.0000 | ORAL_TABLET | Freq: Four times a day (QID) | ORAL | 0 refills | Status: DC | PRN

## 2023-11-03 MED ORDER — CEFAZOLIN SODIUM-DEXTROSE 2-3 GM-%(50ML) IV SOLR
INTRAVENOUS | Status: DC | PRN
  Administered 2023-11-03: 2 g via INTRAVENOUS

## 2023-11-03 MED ORDER — ACETAMINOPHEN 325 MG PO TABS: 325.0000 mg | ORAL_TABLET | Freq: Four times a day (QID) | ORAL | Status: DC | PRN

## 2023-11-03 MED ORDER — ALBUTEROL SULFATE HFA 108 (90 BASE) MCG/ACT IN AERS
INHALATION_SPRAY | RESPIRATORY_TRACT | Status: AC
  Filled 2023-11-03: qty 6.7

## 2023-11-03 MED ORDER — DEXAMETHASONE SODIUM PHOSPHATE 10 MG/ML IJ SOLN
INTRAMUSCULAR | Status: DC | PRN
  Administered 2023-11-03: 5 mg via INTRAVENOUS

## 2023-11-03 MED ORDER — CEFAZOLIN SODIUM-DEXTROSE 2-4 GM/100ML-% IV SOLN
2.0000 g | INTRAVENOUS | Status: AC
  Administered 2023-11-03: 2 g via INTRAVENOUS
  Filled 2023-11-03: qty 100

## 2023-11-03 MED ORDER — AMISULPRIDE (ANTIEMETIC) 5 MG/2ML IV SOLN: 10.0000 mg | Freq: Once | INTRAVENOUS | Status: DC | PRN

## 2023-11-03 MED ORDER — OYSTER SHELL CALCIUM/D3 500-5 MG-MCG PO TABS
1.0000 | ORAL_TABLET | Freq: Two times a day (BID) | ORAL | Status: DC
  Administered 2023-11-04: 1 via ORAL
  Filled 2023-11-03: qty 1

## 2023-11-03 MED ORDER — ATORVASTATIN CALCIUM 40 MG PO TABS
40.0000 mg | ORAL_TABLET | Freq: Every day | ORAL | Status: DC
  Administered 2023-11-03 – 2023-11-04 (×2): 40 mg via ORAL
  Filled 2023-11-03 (×2): qty 1

## 2023-11-03 MED ORDER — BUPIVACAINE-EPINEPHRINE (PF) 0.25% -1:200000 IJ SOLN
INTRAMUSCULAR | Status: DC | PRN
  Administered 2023-11-03: 19 mL

## 2023-11-03 MED ORDER — TRANEXAMIC ACID-NACL 1000-0.7 MG/100ML-% IV SOLN
1000.0000 mg | Freq: Once | INTRAVENOUS | Status: AC
  Administered 2023-11-03: 1000 mg via INTRAVENOUS
  Filled 2023-11-03: qty 100

## 2023-11-03 MED ORDER — FENTANYL CITRATE (PF) 100 MCG/2ML IJ SOLN
INTRAMUSCULAR | Status: DC | PRN
  Administered 2023-11-03: 50 ug via INTRAVENOUS

## 2023-11-03 MED ORDER — OXYCODONE HCL 5 MG PO TABS
5.0000 mg | ORAL_TABLET | ORAL | Status: DC | PRN
Start: 2023-11-03 — End: 2023-11-04

## 2023-11-03 MED ORDER — CHLORHEXIDINE GLUCONATE 0.12 % MT SOLN
15.0000 mL | Freq: Once | OROMUCOSAL | Status: AC
Start: 2023-11-03 — End: 2023-11-03
  Administered 2023-11-03: 15 mL via OROMUCOSAL

## 2023-11-03 MED ORDER — CEFAZOLIN SODIUM-DEXTROSE 2-4 GM/100ML-% IV SOLN
2.0000 g | Freq: Four times a day (QID) | INTRAVENOUS | Status: AC
  Administered 2023-11-03 – 2023-11-04 (×2): 2 g via INTRAVENOUS
  Filled 2023-11-03 (×2): qty 100

## 2023-11-03 MED ORDER — PHENYLEPHRINE 80 MCG/ML (10ML) SYRINGE FOR IV PUSH (FOR BLOOD PRESSURE SUPPORT)
PREFILLED_SYRINGE | INTRAVENOUS | Status: DC | PRN
  Administered 2023-11-03: 80 ug via INTRAVENOUS

## 2023-11-03 MED ORDER — CLOBETASOL PROPIONATE 0.05 % EX CREA: 1.0000 | TOPICAL_CREAM | Freq: Two times a day (BID) | CUTANEOUS | Status: DC

## 2023-11-03 SURGICAL SUPPLY — 60 items
BAG COUNTER SPONGE SURGICOUNT (BAG) IMPLANT
BAG ZIPLOCK 12X15 (MISCELLANEOUS) IMPLANT
BASEPLATE GLENOSPHERE 25 (Plate) IMPLANT
BEARING HUMERAL SHLDER 36M STD (Shoulder) IMPLANT
BIT DRILL 1.6MX128 (BIT) IMPLANT
BIT DRILL TWIST 2.7 (BIT) IMPLANT
BLADE SAG 18X100X1.27 (BLADE) ×1 IMPLANT
COVER BACK TABLE 60X90IN (DRAPES) ×1 IMPLANT
COVER SURGICAL LIGHT HANDLE (MISCELLANEOUS) ×1 IMPLANT
DRAPE INCISE IOBAN 66X45 STRL (DRAPES) ×1 IMPLANT
DRAPE POUCH INSTRU U-SHP 10X18 (DRAPES) IMPLANT
DRAPE SHEET LG 3/4 BI-LAMINATE (DRAPES) ×1 IMPLANT
DRAPE SURG ORHT 6 SPLT 77X108 (DRAPES) ×2 IMPLANT
DRAPE TOP 10253 STERILE (DRAPES) ×1 IMPLANT
DRAPE U-SHAPE 47X51 STRL (DRAPES) ×1 IMPLANT
DRSG ADAPTIC 3X8 NADH LF (GAUZE/BANDAGES/DRESSINGS) ×1 IMPLANT
DURAPREP 26ML APPLICATOR (WOUND CARE) ×1 IMPLANT
ELECT BLADE TIP CTD 4 INCH (ELECTRODE) ×1 IMPLANT
ELECT NDL TIP 2.8 STRL (NEEDLE) ×1 IMPLANT
ELECT NEEDLE TIP 2.8 STRL (NEEDLE) ×1
ELECT REM PT RETURN 15FT ADLT (MISCELLANEOUS) ×1 IMPLANT
FACESHIELD WRAPAROUND (MASK) ×1
FACESHIELD WRAPAROUND OR TEAM (MASK) ×1 IMPLANT
GAUZE PAD ABD 8X10 STRL (GAUZE/BANDAGES/DRESSINGS) ×1 IMPLANT
GAUZE SPONGE 4X4 12PLY STRL (GAUZE/BANDAGES/DRESSINGS) ×1 IMPLANT
GLENOID SPHERE STD STRL 36MM (Orthopedic Implant) IMPLANT
GLOVE BIOGEL PI IND STRL 7.5 (GLOVE) ×1 IMPLANT
GLOVE BIOGEL PI IND STRL 8.5 (GLOVE) ×1 IMPLANT
GLOVE ORTHO TXT STRL SZ7.5 (GLOVE) ×1 IMPLANT
GLOVE SURG ORTHO 8.5 STRL (GLOVE) ×1 IMPLANT
GOWN STRL REUS W/ TWL XL LVL3 (GOWN DISPOSABLE) ×2 IMPLANT
KIT BASIN OR (CUSTOM PROCEDURE TRAY) ×1 IMPLANT
KIT TURNOVER KIT A (KITS) IMPLANT
MANIFOLD NEPTUNE II (INSTRUMENTS) ×1 IMPLANT
NDL MAYO CATGUT SZ4 TPR NDL (NEEDLE) IMPLANT
NEEDLE MAYO CATGUT SZ4 (NEEDLE) ×1
NS IRRIG 1000ML POUR BTL (IV SOLUTION) ×1 IMPLANT
PACK SHOULDER (CUSTOM PROCEDURE TRAY) ×1 IMPLANT
PIN THREADED REVERSE (PIN) IMPLANT
RESTRAINT HEAD UNIVERSAL NS (MISCELLANEOUS) ×1 IMPLANT
SCREW CENTRAL 6.5X20MM (Screw) IMPLANT
SCREW LOCKING 4.75MMX15MM (Screw) IMPLANT
SCREW LOCKING NS 4.75MMX20MM (Screw) IMPLANT
SCREW LOCKING STRL 4.75X25X3.5 (Screw) IMPLANT
SHOULDER HUMERAL BEAR 36M STD (Shoulder) ×1 IMPLANT
SLING ARM FOAM STRAP LRG (SOFTGOODS) IMPLANT
SPIKE FLUID TRANSFER (MISCELLANEOUS) ×1 IMPLANT
SPONGE T-LAP 4X18 ~~LOC~~+RFID (SPONGE) IMPLANT
STEM HUMERAL STRL 11MMX83MM (Stem) IMPLANT
STRIP CLOSURE SKIN 1/2X4 (GAUZE/BANDAGES/DRESSINGS) ×1 IMPLANT
SUT FIBERWIRE #2 38 T-5 BLUE (SUTURE) ×6
SUT MAXBRAID #2 CVD NDL (SUTURE) IMPLANT
SUT MNCRL AB 4-0 PS2 18 (SUTURE) ×1 IMPLANT
SUT VIC AB 0 CT1 36 (SUTURE) ×1 IMPLANT
SUT VIC AB 0 CT2 27 (SUTURE) ×1 IMPLANT
SUT VIC AB 2-0 CT1 TAPERPNT 27 (SUTURE) ×1 IMPLANT
SUTURE FIBERWR #2 38 T-5 BLUE (SUTURE) ×1 IMPLANT
TOWEL GREEN STERILE FF (TOWEL DISPOSABLE) ×1 IMPLANT
TOWEL OR 17X26 10 PK STRL BLUE (TOWEL DISPOSABLE) ×1 IMPLANT
TRAY HUM REV SHOULDER STD +6 (Shoulder) IMPLANT

## 2023-11-03 NOTE — Anesthesia Procedure Notes (Signed)
Procedure Name: Intubation Date/Time: 11/03/2023 3:02 PM  Performed by: Basilio Cairo, CRNAPre-anesthesia Checklist: Patient identified, Patient being monitored, Timeout performed, Emergency Drugs available and Suction available Patient Re-evaluated:Patient Re-evaluated prior to induction Oxygen Delivery Method: Circle system utilized Preoxygenation: Pre-oxygenation with 100% oxygen Induction Type: IV induction Ventilation: Mask ventilation without difficulty Laryngoscope Size: Mac and 3 Grade View: Grade I Tube type: Oral Tube size: 7.5 mm Number of attempts: 1 Airway Equipment and Method: Stylet Placement Confirmation: ETT inserted through vocal cords under direct vision, positive ETCO2 and breath sounds checked- equal and bilateral Secured at: 22 cm Tube secured with: Tape Dental Injury: Teeth and Oropharynx as per pre-operative assessment

## 2023-11-03 NOTE — Anesthesia Postprocedure Evaluation (Signed)
Anesthesia Post Note  Patient: Kimberly Hartman  Procedure(s) Performed: REVERSE SHOULDER ARTHROPLASTY (Right: Shoulder)     Patient location during evaluation: PACU Anesthesia Type: General Level of consciousness: awake and alert Pain management: pain level controlled Vital Signs Assessment: post-procedure vital signs reviewed and stable Respiratory status: spontaneous breathing, nonlabored ventilation, respiratory function stable and patient connected to nasal cannula oxygen Cardiovascular status: blood pressure returned to baseline and stable Postop Assessment: no apparent nausea or vomiting Anesthetic complications: no   No notable events documented.  Last Vitals:  Vitals:   11/03/23 1730 11/03/23 1733  BP: (!) 136/50   Pulse: 83   Resp: 18   Temp:    SpO2: 100% 97%    Last Pain:  Vitals:   11/03/23 1729  TempSrc:   PainSc: 0-No pain                 Beryle Lathe

## 2023-11-03 NOTE — Discharge Instructions (Signed)
Ice to the shoulder constantly.  Keep the incision covered and clean and dry for one week, then ok to get it wet in the shower. Keep the incision covered for one week, then remove the Aquacel bandage and leave the incision open to air  Do exercise as instructed several times per day. Go slow and be gentle with the exercises/ keep the arm in front of you. Sometimes you have to prop a pillow behind your elbow.   DO NOT reach behind your back or push up out of a chair with the operative arm.  Use a sling while you are up and around for comfort, may remove while seated.  Keep pillow propped behind the operative elbow.  Follow up with Dr Ranell Patrick in two weeks in the office, call [phone] for appt  Please call Dr Ranell Patrick (cell) [phone] with any questions or concerns

## 2023-11-03 NOTE — Care Plan (Signed)
Ortho Bundle Case Management Note  Patient Details  Name: Kimberly Hartman MRN: [id number] Date of Birth: April 06, 1951                  R Rev TSA on 11/03/23.  DCP: Home with husband Maisie Fus.  DME: No needs.  PT: HEP   DME Arranged:  N/A DME Agency:       Additional Comments: Please contact me with any questions of if this plan should need to change.    Despina Pole, CCM Case Manager, [facility]  [phone] 11/03/2023, 1:45 PM

## 2023-11-03 NOTE — Brief Op Note (Signed)
11/03/2023  5:11 PM  PATIENT:  Kimberly Hartman  73 y.o. female  PRE-OPERATIVE DIAGNOSIS:  Right proximal humerus fracture, displaced and comminuted  POST-OPERATIVE DIAGNOSIS:  Right proximal humerus fracture, displaced and comminuted  PROCEDURE:  Procedure(s) with comments: REVERSE SHOULDER ARTHROPLASTY (Right) - interscalene block Biomet Comprehensive reverse with tuberosity repair  SURGEON:  Surgeons and Role:    Beverely Low, MD - Primary  PHYSICIAN ASSISTANT:   ASSISTANTS: Thea Gist, PA-C   ANESTHESIA:   regional and general  EBL:  200 mL   BLOOD ADMINISTERED:none  DRAINS: none   LOCAL MEDICATIONS USED:  MARCAINE     SPECIMEN:  No Specimen  DISPOSITION OF SPECIMEN:  N/A  COUNTS:  YES  TOURNIQUET:  * No tourniquets in log *  DICTATION: .Other Dictation: Dictation Number [id number]  PLAN OF CARE: Admit for overnight observation  PATIENT DISPOSITION:  PACU - hemodynamically stable.   Delay start of Pharmacological VTE agent (>24hrs) due to surgical blood loss or risk of bleeding: not applicable

## 2023-11-03 NOTE — Transfer of Care (Signed)
Immediate Anesthesia Transfer of Care Note  Patient: Kimberly Hartman  Procedure(s) Performed: REVERSE SHOULDER ARTHROPLASTY (Right: Shoulder)  Patient Location: PACU  Anesthesia Type:General  Level of Consciousness: awake and alert   Airway & Oxygen Therapy: Patient Spontanous Breathing and Patient connected to face mask oxygen  Post-op Assessment: Report given to RN and Post -op Vital signs reviewed and stable  Post vital signs: Reviewed and stable  Last Vitals:  Vitals Value Taken Time  BP    Temp    Pulse 80 11/03/23 1729  Resp 17 11/03/23 1729  SpO2 98 % 11/03/23 1729  Vitals shown include unfiled device data.  Last Pain:  Vitals:   11/03/23 1400  TempSrc:   PainSc: 0-No pain         Complications: No notable events documented.

## 2023-11-03 NOTE — Op Note (Unsigned)
Kimberly Hartman, LUNG MEDICAL RECORD NO: [id number] ACCOUNT NO: [id number] DATE OF BIRTH: [birth year] FACILITY: [facility] LOCATION: [facility] PHYSICIAN: Almedia Balls. Ranell Patrick, MD  Operative Report   DATE OF PROCEDURE: 11/03/2023  PREOPERATIVE DIAGNOSIS:  Right shoulder proximal humerus fracture, comminuted and displaced.  POSTOPERATIVE DIAGNOSIS: Right shoulder proximal humerus fracture, comminuted and displaced.  PROCEDURE PERFORMED:  Right reverse total shoulder arthroplasty using Biomet Comprehensive Reverse Total Shoulder System with tuberosity repair.  ATTENDING SURGEON:  Almedia Balls. Ranell Patrick, MD  ASSISTANT:  Konrad Felix Dixon, [place], who was scrubbed during the entire procedure, and necessary for satisfactory completion of surgery.  ANESTHESIA:  General anesthesia was used, placed interscalene block.  ESTIMATED BLOOD LOSS:  200 mL.  FLUID REPLACEMENT:  1500 mL crystalloid.  COUNTS:  Instrument count was correct.  COMPLICATIONS:  There were no complications.  ANTIBIOTICS:  Perioperative antibiotics were given.  INDICATIONS:  The patient is a 73 year old female who suffered a fall injuring her right shoulder.  The patient presented with a grossly displaced and comminuted proximal humerus fracture with a head split.  We discussed options with the patient  recommending reverse total shoulder arthroplasty to eliminate pain and restore function.  The patient agreed.  Informed consent was obtained.  DESCRIPTION OF PROCEDURE:  After an adequate level of anesthesia was achieved, the patient was positioned in the modified beach chair position. The right shoulder was correctly identified and sterile prep and drape were performed.  Timeout called  verifying the correct patient and correct site. We entered the patient's shoulder using a standard deltopectoral incision starting at the coracoid process extending down to the anterior humerus.  We identified the cephalic vein and took that laterally  with the  deltoid. Pectoralis was retracted medially.  The conjoined tendon was identified and retracted medially.  Deep retractor was placed.  We tenodesed the biceps in situ with 0 Vicryl figure-of-eight suture.  We then went ahead and osteotomized the  lesser tuberosity free from the surrounding bone with the subscapularis attached to it.  We then debulked the lesser tuberosity for repair and then placed two #2 FiberWire sutures in a mattress fashion medial to the lesser tuberosity with a good purchase  on the subscapularis.  We then went and osteotomized the greater tuberosity off of the humeral head.  Once we had that done, we debulked that and got it down to a nice thin crater and then placed a total of three #2 FiberWire sutures in a mattress  fashion lateral to the greater tuberosity getting good tendon purchase. We removed the head to the back table and got a bunch of cancellous bone out of that for the tuberosity repair at the end.  Once we had the head out, the tuberosities prepped, then  we went to retract the humerus posteriorly and gained good exposure of the glenoid.  We removed the biceps and the labrum and the cartilage.  We got down to good bone and then placed our deep retractors.  Next, we placed our guide pin for the metaglene  reaming.  We then reamed with the star reamer just to subchondral bone where the bottom half was into the subchondral bone and the top half was just barely scraped.  We impacted the mini baseplate into position. Once we had that secured, then we went  ahead and drilled out our central hole and measured the depth to 20 mm thickness.  We went ahead and placed a 20 mm central screw, 6.5, and that had good purchase.  We then drilled our superior and inferior screws and placed 25-mm thickness screws both  superiorly and a 20 screw inferiorly, and then we were able to get a 15 screw anteriorly.  With three good screws purchased, we went ahead and selected the 36 standard  glenosphere and then did 0 offset and dialed the eccentricity to the B setting and  placed that eccentricity inferiorly.  With the glenosphere and the [facility] taper prepared on the back table and impacted, we went ahead and placed that eccentricity inferiorly and impacted the head onto the baseplate.  We did our secondary impactor, made  sure that it was secure, and then I did a finger sweep to make sure we had no soft tissue caught up between the baseplate and the head.  With the head in place and in good position, we went back to the humeral side. We went ahead and started reaming up  to a size 11. We then broached first with a 9, then a 10, then 11 and found that that Comprehensive Mini-Stem worked very well.  So, with the trial stem in place, we went ahead and selected the standard offset tray and then the basic poly.  We placed the  trial poly in place, reduced the shoulder, made sure the tuberosities were going to fit, and selected this as our implants.  We went ahead and took the 11 Biomet Comprehensive Mini-Stem for the reverse and then went ahead and impacted that into the  shoulder with about 25 degrees of retroversion.  It had excellent purchase in the shaft and stayed about the same height as we had done our trial, so we selected the same standard baseplate with no significant offset and then impacted and selected the  same poly, which was the 0 offset poly.  So, after the poly was placed onto the tray, we impacted the tray onto the stem and reduced the shoulder.  Nice little pop as it reduced.  Appropriate tension. No gapping with inferior pole or external rotation.   Then, we irrigated thoroughly.  We bone grafted the proximal portion of the implant that was exposed just under the tray and then also prior to placing our stem, we drilled holes in the lesser tuberosity and the greater tuberosity, basically in the shaft  on either side of the biceps groove and placed two #2 FiberWire sutures for  repair of the tuberosities.  Then we also did an around-the-world stitch around the backside of the stem and then drove the stem down . So we had the around-the-world stitch  plus the two shaft sutures. So, once we had reduced the shoulder and bone grafted the proximal portion of the metaphysis of the implant, then we placed our tuberosities anatomically back into position.  We tied tuberosity to tuberosity with our mattress  sutures with the FiberWire.  We also placed around-the-world stitch lateral to the greater tuberosity and medial to the lesser tuberosity.  We did our shaft sutures up medial to the lesser tuberosity and lateral to the greater tuberosity and tied those  down, so we had good shaft of tuberosity fixation and then we tied her around the world stitch for good compression of the tuberosities to the implant and to the bone graft.  We also oversewed the rotator interval with multiple #2 FiberWire sutures side  to side.  We had a very nice repair of everything moving together as a unit with basic range of motion.  No instability. No impingement.  We irrigated again and then closed the deltopectoral interval with #0 Vicryl suture followed by 2-0 Vicryl for  subcutaneous closure and staples for skin.  A sterile dressing was applied followed by a shoulder sling.  The patient was transported to the recovery room in stable condition.   MUK D: 11/03/2023 5:20:36 pm T: 11/03/2023 8:37:00 pm  JOB: [id number]/ [id number]

## 2023-11-03 NOTE — Anesthesia Procedure Notes (Signed)
Anesthesia Regional Block: Interscalene brachial plexus block   Pre-Anesthetic Checklist: , timeout performed,  Correct Patient, Correct Site, Correct Laterality,  Correct Procedure, Correct Position, site marked,  Risks and benefits discussed,  Surgical consent,  Pre-op evaluation,  At surgeon's request and post-op pain management  Laterality: Right  Prep: chloraprep       Needles:  Injection technique: Single-shot  Needle Type: Echogenic Needle     Needle Length: 9cm  Needle Gauge: 21     Additional Needles:   Procedures:,,,, ultrasound used (permanent image in chart),,    Narrative:  Start time: 11/03/2023 1:50 PM End time: 11/03/2023 1:57 PM Injection made incrementally with aspirations every 5 mL.  Performed by: Personally  Anesthesiologist: Marcene Duos, MD

## 2023-11-03 NOTE — Anesthesia Preprocedure Evaluation (Signed)
Anesthesia Evaluation  Patient identified by MRN, date of birth, ID band Patient awake    Reviewed: Allergy & Precautions, NPO status , Patient's Chart, lab work & pertinent test results  Airway Mallampati: II  TM Distance: >3 FB Neck ROM: Full    Dental  (+) Dental Advisory Given   Pulmonary sleep apnea and Continuous Positive Airway Pressure Ventilation , COPD, Current Smoker   breath sounds clear to auscultation       Cardiovascular negative cardio ROS  Rhythm:Regular Rate:Normal     Neuro/Psych negative neurological ROS     GI/Hepatic negative GI ROS, Neg liver ROS,,,  Endo/Other  diabetesHypothyroidism    Renal/GU negative Renal ROS     Musculoskeletal  (+) Arthritis ,    Abdominal   Peds  Hematology negative hematology ROS (+)   Anesthesia Other Findings   Reproductive/Obstetrics                             Anesthesia Physical Anesthesia Plan  ASA: 3  Anesthesia Plan: General   Post-op Pain Management: Regional block* and Tylenol PO (pre-op)*   Induction: Intravenous  PONV Risk Score and Plan: 2 and Dexamethasone, Ondansetron and Treatment may vary due to age or medical condition  Airway Management Planned: Oral ETT  Additional Equipment:   Intra-op Plan:   Post-operative Plan: Extubation in OR  Informed Consent: I have reviewed the patients History and Physical, chart, labs and discussed the procedure including the risks, benefits and alternatives for the proposed anesthesia with the patient or authorized representative who has indicated his/her understanding and acceptance.     Dental advisory given  Plan Discussed with: CRNA  Anesthesia Plan Comments:        Anesthesia Quick Evaluation

## 2023-11-03 NOTE — Interval H&P Note (Signed)
History and Physical Interval Note:  11/03/2023 12:57 PM  Kimberly Hartman  has presented today for surgery, with the diagnosis of Right proximal humerus fracture.  The various methods of treatment have been discussed with the patient and family. After consideration of risks, benefits and other options for treatment, the patient has consented to  Procedure(s) with comments: REVERSE SHOULDER ARTHROPLASTY (Right) - interscalene block as a surgical intervention.  The patient's history has been reviewed, patient examined, no change in status, stable for surgery.  I have reviewed the patient's chart and labs.  Questions were answered to the patient's satisfaction.     [name]

## 2023-11-04 DIAGNOSIS — F17210 Nicotine dependence, cigarettes, uncomplicated: Secondary | ICD-10-CM | POA: Diagnosis not present

## 2023-11-04 DIAGNOSIS — J449 Chronic obstructive pulmonary disease, unspecified: Secondary | ICD-10-CM | POA: Diagnosis not present

## 2023-11-04 DIAGNOSIS — E119 Type 2 diabetes mellitus without complications: Secondary | ICD-10-CM | POA: Diagnosis not present

## 2023-11-04 DIAGNOSIS — Z79899 Other long term (current) drug therapy: Secondary | ICD-10-CM | POA: Diagnosis not present

## 2023-11-04 DIAGNOSIS — E039 Hypothyroidism, unspecified: Secondary | ICD-10-CM | POA: Diagnosis not present

## 2023-11-04 DIAGNOSIS — S42291A Other displaced fracture of upper end of right humerus, initial encounter for closed fracture: Secondary | ICD-10-CM | POA: Diagnosis not present

## 2023-11-04 LAB — BASIC METABOLIC PANEL
Anion gap: 8 (ref 5–15)
BUN: 17 mg/dL (ref 8–23)
CO2: 26 mmol/L (ref 22–32)
Calcium: 8.8 mg/dL — ABNORMAL LOW (ref 8.9–10.3)
Chloride: 104 mmol/L (ref 98–111)
Creatinine, Ser: 0.68 mg/dL (ref 0.44–1.00)
GFR, Estimated: >60 mL/min (ref >60)
Glucose, Bld: 167 mg/dL — ABNORMAL HIGH (ref 70–99)
Potassium: 4.9 mmol/L (ref 3.5–5.1)
Sodium: 138 mmol/L (ref 135–145)

## 2023-11-04 LAB — HEMOGLOBIN AND HEMATOCRIT, BLOOD
HCT: 32.9 % — ABNORMAL LOW (ref 36.0–46.0)
Hemoglobin: 10.3 g/dL — ABNORMAL LOW (ref 12.0–15.0)

## 2023-11-04 NOTE — Discharge Summary (Signed)
In most cases prophylactic antibiotics for Dental procdeures after total joint surgery are not necessary.  Exceptions are as follows:  1. History of prior total joint infection  2. Severely immunocompromised (Organ Transplant, cancer chemotherapy, Rheumatoid biologic meds such as Humera)  3. Poorly controlled diabetes (A1C &gt; 8.0, blood glucose over 200)  If you have one of these conditions, contact your surgeon for an antibiotic prescription, prior to your dental procedure. Orthopedic Discharge Summary        Physician Discharge Summary  Patient ID: Kimberly Hartman MRN: [id number] DOB/AGE: 24-Jun-1951 73 y.o.  Admit date: 11/03/2023 Discharge date: 11/04/2023   Procedures:  Procedure(s) (LRB): REVERSE SHOULDER ARTHROPLASTY (Right)  Attending Physician:  Dr. Malon Kindle  Admission Diagnoses:   right proximal humerus fracture  Discharge Diagnoses:  right proximal humerus fracture   Past Medical History:  Diagnosis Date   Allergy    Anxiety    Arthritis    Cataract    removed both , lens implants   COPD (chronic obstructive pulmonary disease) (HCC)    Depression    Diabetes mellitus without complication (HCC)    Hypothyroidism    Psoriasis    Thyroid disease     PCP: Joaquim Nam, MD   Discharged Condition: good  Hospital Course:  Patient underwent the above stated procedure on 11/03/2023. Patient tolerated the procedure well and brought to the recovery room in good condition and subsequently to the floor. Patient had an uncomplicated hospital course and was stable for discharge.   Disposition: Discharge disposition: 01-Home or Self Care      with follow up in 2 weeks    Follow-up Information     Beverely Low, MD. Schedule an appointment as soon as possible for a visit in 2 week(s).   Specialty: Orthopedic Surgery Why: Call [phone] as soon as possible to schedule a follow up in 2 weeks. Contact information: [address] [place] [address] [phone]                 Dental Antibiotics:  In most cases prophylactic antibiotics for Dental procdeures after total joint surgery are not necessary.  Exceptions are as follows:  1. History of prior total joint infection  2. Severely immunocompromised (Organ Transplant, cancer chemotherapy, Rheumatoid biologic meds such as Humera)  3. Poorly controlled diabetes (A1C &gt; 8.0, blood glucose over 200)  If you have one of these conditions, contact your surgeon for an antibiotic prescription, prior to your dental procedure.  Discharge Instructions     Call MD / Call 911   Complete by: As directed    If you experience chest pain or shortness of breath, CALL 911 and be transported to the hospital emergency room.  If you develope a fever above 101 F, pus (white drainage) or increased drainage or redness at the wound, or calf pain, call your surgeon's office.   Constipation Prevention   Complete by: As directed    Drink plenty of fluids.  Prune juice may be helpful.  You may use a stool softener, such as Colace (over the counter) 100 mg twice a day.  Use MiraLax (over the counter) for constipation as needed.   Diet - low sodium heart healthy   Complete by: As directed    Increase activity slowly as tolerated   Complete by: As directed    Post-operative opioid taper instructions:   Complete by: As directed    POST-OPERATIVE OPIOID TAPER INSTRUCTIONS: It is important to  wean off of your opioid medication as soon as possible. If you do not need pain medication after your surgery it is ok to stop day one. Opioids include: Codeine, Hydrocodone(Norco, Vicodin), Oxycodone(Percocet, oxycontin) and hydromorphone amongst others.  Long term and even short term use of opiods can cause: Increased pain response Dependence Constipation Depression Respiratory depression And more.  Withdrawal symptoms can include Flu like symptoms Nausea, vomiting And  more Techniques to manage these symptoms Hydrate well Eat regular healthy meals Stay active Use relaxation techniques(deep breathing, meditating, yoga) Do Not substitute Alcohol to help with tapering If you have been on opioids for less than two weeks and do not have pain than it is ok to stop all together.  Plan to wean off of opioids This plan should start within one week post op of your joint replacement. Maintain the same interval or time between taking each dose and first decrease the dose.  Cut the total daily intake of opioids by one tablet each day Next start to increase the time between doses. The last dose that should be eliminated is the evening dose.          Allergies as of 11/04/2023       Reactions   Chantix [varenicline Tartrate]    Glimepiride    hypoglycemia   Metformin And Related    Reclast [zoledronic Acid]    Aches, joint pain   Bacitra-neomycin-polymyxin-hc Itching, Rash, Swelling   Neo-bacit-poly-lidocaine Itching, Swelling, Dermatitis, Rash   She can tolerate bacitracin.     Penicillins Rash   TOLERATED KEFLEX WITH NO ISSUES   Tape Dermatitis, Rash   THE ADHESIVE PART NOT THE ACTUAL TAPE   Tetracyclines & Related Rash   Wellbutrin [bupropion] Rash        Medication List     STOP taking these medications    triamcinolone cream 0.1 % Commonly known as: KENALOG       TAKE these medications    acetaminophen 325 MG tablet Commonly known as: TYLENOL Take 650 mg by mouth every 6 (six) hours as needed for mild pain (pain score 1-3) or moderate pain (pain score 4-6).   atorvastatin 40 MG tablet Commonly known as: LIPITOR Take 1 tablet (40 mg total) by mouth daily.   Calcium-D 600-400 MG-UNIT Tabs Take 1 tablet by mouth 2 (two) times daily.   cetirizine 10 MG tablet Commonly known as: ZYRTEC Take 10 mg by mouth daily.   clobetasol cream 0.05 % Commonly known as: TEMOVATE Apply 1 Application topically 2 (two) times daily. Apply to  red area on back until it resolves, then stop. Use for up to two weeks and then take two weeks off before restarting What changed:  when to take this reasons to take this   COSENTYX SENSOREADY (300 MG) Wilder Inject 300 mg into the skin every 30 (thirty) days.   glucose blood test strip Commonly known as: ONE TOUCH ULTRA TEST Use to check blood sugar once a day.   hydrocortisone 2.5 % cream Apply topically 2 (two) times daily. Apply to rash between the breast BID prn x up to 2 weeks.   ibuprofen 200 MG tablet Commonly known as: ADVIL Take 400 mg by mouth every 4 (four) hours.   levothyroxine 100 MCG tablet Commonly known as: SYNTHROID Take 1 tablet (100 mcg total) by mouth every morning.   methocarbamol 500 MG tablet Commonly known as: ROBAXIN Take 500 mg by mouth 4 (four) times daily.   Multivitamin Adults 50+  Tabs Take 1 tablet by mouth daily.   ondansetron 4 MG tablet Commonly known as: Zofran Take 1 tablet (4 mg total) by mouth every 8 (eight) hours as needed for refractory nausea / vomiting, vomiting or nausea.   Ozempic (0.25 or 0.5 MG/DOSE) 2 MG/1.5ML Sopn Generic drug: Semaglutide(0.25 or 0.5MG /DOS) Inject 0.5 mg into the skin once a week. As directed   Percocet 5-325 MG tablet Generic drug: oxyCODONE-acetaminophen Take 1 tablet by mouth every 6 (six) hours as needed for severe pain (pain score 7-10). What changed:  when to take this reasons to take this   pioglitazone 45 MG tablet Commonly known as: ACTOS Take 45 mg by mouth daily.   sertraline 50 MG tablet Commonly known as: ZOLOFT TAKE 1 TABLET BY MOUTH DAILY   Vitamin D3 50 MCG (2000 UT) Tabs Take 2,000 Units by mouth daily.          Signed: Thea Gist 11/04/2023, 10:26 AM  [facility] is now [facility] [address], [address], [place], [place] [address] Phone: [phone] Facebook  Instagram  [employer]

## 2023-11-04 NOTE — Progress Notes (Signed)
   Subjective: 1 Day Post-Op Procedure(s) (LRB): REVERSE SHOULDER ARTHROPLASTY (Right)  Pt doing well this morning Therapy progressed well this morning Ready to d/c home Patient reports pain as none.  Objective:   VITALS:   Vitals:   11/04/23 0650 11/04/23 0933  BP:  (!) 127/55  Pulse:  82  Resp:  16  Temp:  98.1 F (36.7 C)  SpO2: 97% 96%    Right shoulder; dressing changed Sling in good position Nv intact distally No signs of drainage or erythema  LABS Recent Labs    11/04/23 0336  HGB 10.3*  HCT 32.9*    Recent Labs    11/04/23 0336  NA 138  K 4.9  BUN 17  CREATININE 0.68  GLUCOSE 167*     Assessment/Plan: 1 Day Post-Op Procedure(s) (LRB): REVERSE SHOULDER ARTHROPLASTY (Right) D/c home today F/u in 2 weeks in the office Gentle activity with no heavy lifting with the right upper extremity     Brad Antonieta Iba, MPAS [facility] is now [facility]  [place] [address], [address], [place], [place] [address] Phone: [phone] [email or web address] Facebook  [employer]

## 2023-11-04 NOTE — Progress Notes (Signed)
Discharge instructions provided and explained to patient; patient verbalizes understanding; patient escorted to lobby in wheel chair via tech to be discharged home with family.

## 2023-11-04 NOTE — Evaluation (Signed)
Occupational Therapy Evaluation Patient Details Name: Wave Calzada MRN: [id number] DOB: [birth year] Today's Date: 11/04/2023   History of Present Illness Patient is a 73 year old female who presented with Right shoulder proximal humerus fracture, comminuted and displaced after fall and fracture. Patient underwent right reverse shoulder replacement on 11/03/23. PMH: anxiety, cataracts, COPD, DM, psoriasis, R knee arthroscopy, trigger finger release L middle and index.   Clinical Impression   PTA pt lives at home with husband and small dog.  Education completed regarding compensatory strategies for ADL tasks and functional mobility, management of sling, no shoulder ROM per specified parameters in the order set as indicated below, positioning of operative arm in sitting and supine and edema control, including use of "Iceman" Cold Therapy machine. Written handouts provided and reviewed using Teach Back and pt verbalized/demonstrated understanding. Patient reported that she was following most of these restrictions for last week since fall with husband able to assist her with all tasks as noted below. Due to the below listed deficits, pt requires mod A  assistance with ADL tasks and supervision assist with functional mobility. Caregiver will be able to provide necessary level of assistance at discharge. Pt to follow up with MD to progress rehab of the operative shoulder.        If plan is discharge home, recommend the following: A little help with walking and/or transfers;A little help with bathing/dressing/bathroom;Direct supervision/assist for medications management;Assist for transportation;Help with stairs or ramp for entrance;Assistance with cooking/housework    Functional Status Assessment  Patient has had a recent decline in their functional status and demonstrates the ability to make significant improvements in function in a reasonable and predictable amount of time.  Equipment Recommendations   None recommended by OT       Precautions / Restrictions Precautions Precautions: Fall Precaution Comments: no shoulder ROM, OK for hand wrist and elbow, ok for hand to face for gentle ADLs. Restrictions Weight Bearing Restrictions Per Provider Order: Yes RUE Weight Bearing Per Provider Order: Non weight bearing      Mobility Bed Mobility Overal bed mobility: Needs Assistance Bed Mobility: Supine to Sit     Supine to sit: Supervision, HOB elevated     General bed mobility comments: to the L side               Balance Overall balance assessment: Mild deficits observed, not formally tested           ADL either performed or assessed with clinical judgement   ADL Overall ADL's : Needs assistance/impaired Eating/Feeding: Set up Eating/Feeding Details (indicate cue type and reason): in recliner Grooming: Standing;Wash/dry hands Grooming Details (indicate cue type and reason): min A         Upper Body Dressing : Moderate assistance;Sitting Upper Body Dressing Details (indicate cue type and reason): cues for decreased sensation in UE Lower Body Dressing: Moderate assistance;Sitting/lateral leans Lower Body Dressing Details (indicate cue type and reason): with increased time Toilet Transfer: Supervision/safety Toilet Transfer Details (indicate cue type and reason): walking to bathroom with patient attempting to reach out to hold onto walls for "comfort". eduated on using cane or stopping this. patient verbalized understanding. Toileting- Clothing Manipulation and Hygiene: Minimal assistance;Set up                Pertinent Vitals/Pain Pain Assessment Pain Assessment: No/denies pain     Extremity/Trunk Assessment Upper Extremity Assessment Upper Extremity Assessment: Right hand dominant;RUE deficits/detail RUE Deficits / Details: R shoulder surgery. no ROM allowed  to shoulder, pins and needles in digits with minimal movement              Cognition  Arousal: Alert Behavior During Therapy: WFL for tasks assessed/performed Overall Cognitive Status: Within Functional Limits for tasks assessed               General Comments  VSS O2 90s during session       Shoulder Instructions Shoulder Instructions Donning/doffing shirt without moving shoulder: Patient able to independently direct caregiver Method for sponge bathing under operated UE: Patient able to independently direct caregiver Donning/doffing sling/immobilizer: Patient able to independently direct caregiver Correct positioning of sling/immobilizer: Patient able to independently direct caregiver ROM for elbow, wrist and digits of operated UE: Patient able to independently direct caregiver Sling wearing schedule (on at all times/off for ADL's): Minimal assistance Proper positioning of operated UE when showering: Patient able to independently direct caregiver Positioning of UE while sleeping: Patient able to independently direct caregiver    Home Living Family/patient expects to be discharged to:: Private residence Living Arrangements: Spouse/significant other Available Help at Discharge: Family;Available 24 hours/day Type of Home: House             Bathroom Shower/Tub: Walk-in shower         Home Equipment: Shower seat          Prior Functioning/Environment Prior Level of Function : Independent/Modified Independent                        OT Problem List: Impaired UE functional use;Decreased safety awareness;Decreased knowledge of use of DME or AE;Decreased knowledge of precautions      OT Treatment/Interventions: Self-care/ADL training;DME and/or AE instruction;Balance training;Patient/family education    OT Goals(Current goals can be found in the care plan section) Acute Rehab OT Goals OT Goal Formulation: All assessment and education complete, DC therapy  OT Frequency: Min 1X/week       AM-PAC OT "6 Clicks" Daily Activity     Outcome  Measure Help from another person eating meals?: A Little Help from another person taking care of personal grooming?: A Little Help from another person toileting, which includes using toliet, bedpan, or urinal?: A Little Help from another person bathing (including washing, rinsing, drying)?: A Little Help from another person to put on and taking off regular upper body clothing?: A Little Help from another person to put on and taking off regular lower body clothing?: A Little 6 Click Score: 18   End of Session Equipment Utilized During Treatment: Other (comment) (sling) Nurse Communication: Mobility status  Activity Tolerance: Patient tolerated treatment well Patient left: in chair;with call bell/phone within reach  OT Visit Diagnosis: Unsteadiness on feet (R26.81)                Time: 0981-1914 OT Time Calculation (min): 37 min Charges:  OT General Charges $OT Visit: 1 Visit OT Evaluation $OT Eval Low Complexity: 1 Low OT Treatments $Self Care/Home Management : 8-22 mins  Rosalio Loud, MS Acute Rehabilitation Department Office# [phone]   Selinda Flavin 11/04/2023, 8:42 AM

## 2023-11-06 ENCOUNTER — Encounter (HOSPITAL_COMMUNITY): Payer: Self-pay | Admitting: Orthopedic Surgery

## 2023-11-06 LAB — GLUCOSE, CAPILLARY: Glucose-Capillary: 144 mg/dL — ABNORMAL HIGH (ref 70–99)

## 2023-11-12 ENCOUNTER — Other Ambulatory Visit: Payer: Self-pay | Admitting: Family Medicine

## 2023-11-12 DIAGNOSIS — E039 Hypothyroidism, unspecified: Secondary | ICD-10-CM

## 2023-11-12 DIAGNOSIS — E119 Type 2 diabetes mellitus without complications: Secondary | ICD-10-CM

## 2023-11-12 DIAGNOSIS — M8588 Other specified disorders of bone density and structure, other site: Secondary | ICD-10-CM

## 2023-11-12 DIAGNOSIS — M85859 Other specified disorders of bone density and structure, unspecified thigh: Secondary | ICD-10-CM

## 2023-11-15 ENCOUNTER — Ambulatory Visit: Payer: Medicare Other

## 2023-11-15 VITALS — Ht 63.0 in | Wt 162.0 lb

## 2023-11-15 DIAGNOSIS — Z0000 Encounter for general adult medical examination without abnormal findings: Secondary | ICD-10-CM | POA: Diagnosis not present

## 2023-11-15 NOTE — Patient Instructions (Signed)
Ms. Besecker , Thank you for taking time to come for your Medicare Wellness Visit. I appreciate your ongoing commitment to your health goals. Please review the following plan we discussed and let me know if I can assist you in the future.   Referrals/Orders/Follow-Ups/Clinician Recommendations:   You have an order for:  []   2D Mammogram  [x]   3D Mammogram -schedule after 03/07/24 []   Bone Density     Please call for appointment:  [facility]  [address] [place] [address] [phone]  [facility] [address] [place], [place] [address] [phone]  [facility] [address] [place], [place] [address] [phone]   [facility] [address] [place], [place] [address] [phone]  [facility] [address] [place], [place] [address] [phone]  [facility] [address] [place], [place] [address] [phone]  [facility] [address] [place], [place] [address] [phone]  [facility] [address] [place], [place] [address] [phone]    Make sure to wear two-piece clothing.  No lotions, powders, or deodorants the day of the appointment. Make sure to bring picture ID and insurance card.  Bring list of medications you are currently taking including any supplements.   Schedule your [place] screening mammogram through MyChart!   Log into your MyChart account.  Go to 'Visit' (or 'Appointments' if on mobile App) --> Schedule an Appointment  Under 'Select a Reason for Visit' choose the Mammogram Screening option.  Complete the pre-visit questions and select the time and place that best fits your schedule.    This is a list of the screening recommended for you and due  dates:  Health Maintenance  Topic Date Due   DTaP/Tdap/Td vaccine (2 - Td or Tdap) 09/12/2022   COVID-19 Vaccine (7 - 2024-25 season) 06/04/2023   Yearly kidney health urinalysis for diabetes  11/11/2023   Complete foot exam   11/19/2023   Screening for Lung Cancer  01/25/2024   Eye exam for diabetics  04/19/2024   Hemoglobin A1C  04/24/2024   Yearly kidney function blood test for diabetes  11/03/2024   Medicare Annual Wellness Visit  11/14/2024   Mammogram  03/07/2025   Colon Cancer Screening  04/19/2027   Pneumonia Vaccine  Completed   Flu Shot  Completed   DEXA scan (bone density measurement)  Completed   Hepatitis C Screening  Completed   Zoster (Shingles) Vaccine  Completed   HPV Vaccine  Aged Out    Advanced directives: (In Chart) A copy of your advanced directives are scanned into your chart should your provider ever need it.  Next Medicare Annual Wellness Visit scheduled for next year: Yes 11/15/2024 @ 8:50am televisit

## 2023-11-15 NOTE — Progress Notes (Signed)
Subjective:   Kimberly Hartman is a 73 y.o. female who presents for Medicare Annual (Subsequent) preventive examination.  Visit Complete: Virtual I connected with  Kimberly Hartman on 11/15/23 by a audio enabled telemedicine application and verified that I am speaking with the correct person using two identifiers.  Patient Location: Home  Provider Location: Home Office  I discussed the limitations of evaluation and management by telemedicine. The patient expressed understanding and agreed to proceed.  Vital Signs: Because this visit was a virtual/telehealth visit, some criteria may be missing or patient reported. Any vitals not documented were not able to be obtained and vitals that have been documented are patient reported.  Patient Medicare AWV questionnaire was completed by the patient on 11/12/2023; I have confirmed that all information answered by patient is correct and no changes since this date.  Cardiac Risk Factors include: advanced age (>67men, >60 women);diabetes mellitus;dyslipidemia;smoking/ tobacco exposure     Objective:    Today's Vitals   11/12/23 1208 11/15/23 0857  Weight:  162 lb (73.5 kg)  Height:  5\' 3"  (1.6 m)  PainSc: 2     Body mass index is 28.7 kg/m.     11/03/2023    6:17 PM 10/26/2023    1:12 PM 08/16/2023    9:00 AM 08/08/2023    1:09 PM 11/09/2022    8:46 AM 11/04/2022   12:43 PM 11/04/2021    8:58 AM  Advanced Directives  Does Patient Have a Medical Advance Directive? Yes Yes Yes Yes Yes Yes Yes  Type of Estate agent of [place];Living will Healthcare Power of [place];Living will Healthcare Power of [place];Living will Healthcare Power of [place];Living will Healthcare Power of [place];Living will  Healthcare Power of [place];Living will  Does patient want to make changes to medical advance directive? No - Patient declined  No - Patient declined No - Patient declined   Yes (MAU/Ambulatory/Procedural Areas - Information  given)  Copy of Healthcare Power of Attorney in Chart? No - copy requested No - copy requested No - copy requested Yes - validated most recent copy scanned in chart (See row information) Yes - validated most recent copy scanned in chart (See row information)  Yes - validated most recent copy scanned in chart (See row information)  Would patient like information on creating a medical advance directive?   No - Patient declined No - Patient declined       Current Medications (verified) Outpatient Encounter Medications as of 11/15/2023  Medication Sig   acetaminophen (TYLENOL) 325 MG tablet Take 650 mg by mouth every 6 (six) hours as needed for mild pain (pain score 1-3) or moderate pain (pain score 4-6).   atorvastatin (LIPITOR) 40 MG tablet Take 1 tablet (40 mg total) by mouth daily.   Calcium Carbonate-Vitamin D (CALCIUM-D) 600-400 MG-UNIT TABS Take 1 tablet by mouth 2 (two) times daily.   cetirizine (ZYRTEC) 10 MG tablet Take 10 mg by mouth daily.   Cholecalciferol (VITAMIN D3) 50 MCG (2000 UT) TABS Take 2,000 Units by mouth daily.   clobetasol cream (TEMOVATE) 0.05 % Apply 1 Application topically 2 (two) times daily. Apply to red area on back until it resolves, then stop. Use for up to two weeks and then take two weeks off before restarting (Patient taking differently: Apply 1 Application topically daily as needed (Red area). Apply to red area on back until it resolves, then stop. Use for up to two weeks and then take two weeks off before  restarting)   glucose blood (ONE TOUCH ULTRA TEST) test strip Use to check blood sugar once a day.   hydrocortisone 2.5 % cream Apply topically 2 (two) times daily. Apply to rash between the breast BID prn x up to 2 weeks.   ibuprofen (ADVIL) 200 MG tablet Take 400 mg by mouth every 4 (four) hours.   levothyroxine (SYNTHROID) 100 MCG tablet Take 1 tablet (100 mcg total) by mouth every morning.   methocarbamol (ROBAXIN) 500 MG tablet Take 500 mg by mouth 4 (four)  times daily.   Multiple Vitamins-Minerals (MULTIVITAMIN ADULTS 50+) TABS Take 1 tablet by mouth daily.   ondansetron (ZOFRAN) 4 MG tablet Take 1 tablet (4 mg total) by mouth every 8 (eight) hours as needed for refractory nausea / vomiting, vomiting or nausea.   PERCOCET 5-325 MG tablet Take 1 tablet by mouth every 6 (six) hours as needed for severe pain (pain score 7-10).   pioglitazone (ACTOS) 45 MG tablet Take 45 mg by mouth daily.   Secukinumab (COSENTYX SENSOREADY, 300 MG, [place]) Inject 300 mg into the skin every 30 (thirty) days.   Semaglutide,0.25 or 0.5MG /DOS, (OZEMPIC, 0.25 OR 0.5 MG/DOSE,) 2 MG/1.5ML SOPN Inject 0.5 mg into the skin once a week. As directed   sertraline (ZOLOFT) 50 MG tablet TAKE 1 TABLET BY MOUTH DAILY   No facility-administered encounter medications on file as of 11/15/2023.    Allergies (verified) Chantix [varenicline tartrate], Glimepiride, Metformin and related, Reclast [zoledronic acid], Bacitra-neomycin-polymyxin-hc, Neo-bacit-poly-lidocaine, Penicillins, Tape, Tetracyclines & related, and Wellbutrin [bupropion]   History: Past Medical History:  Diagnosis Date   Allergy    Anxiety    Arthritis    Cataract    removed both , lens implants   COPD (chronic obstructive pulmonary disease) (HCC)    Depression    Diabetes mellitus without complication (HCC)    Hypothyroidism    Psoriasis    Thyroid disease    Past Surgical History:  Procedure Laterality Date   ABDOMINAL HYSTERECTOMY  2001   ABDOMINAL SURGERY     ADENOIDECTOMY  1956   APPENDECTOMY  1967   BREAST EXCISIONAL BIOPSY Right    benign   COLONOSCOPY     DENTAL SURGERY     EYE SURGERY  2018   Cataract   KNEE ARTHROSCOPY WITH MENISCAL REPAIR Right    x2   POLYPECTOMY     REVERSE SHOULDER ARTHROPLASTY Right 11/03/2023   Procedure: REVERSE SHOULDER ARTHROPLASTY;  Surgeon: Beverely Low, MD;  Location: [facility];  Service: Orthopedics;  Laterality: Right;  interscalene block   TRIGGER FINGER RELEASE  Left 08/16/2023   Procedure: LEFT INDEX AND MIDDLE FINGER RELEASE TRIGGER FINGER/A-1 PULLEY;  Surgeon: Marlyne Beards, MD;  Location: [facility];  Service: Orthopedics;  Laterality: Left;  MAC AND LOCAL   TRIGGER FINGER RELEASE  08/16/2023   L middle finger and Index finger   Family History  Problem Relation Age of Onset   Lung cancer Mother    Esophageal cancer Father    Stroke Maternal Grandmother    Heart disease Maternal Grandmother    Colon cancer Neg Hx    Rectal cancer Neg Hx    Stomach cancer Neg Hx    Colon polyps Neg Hx    Breast cancer Neg Hx    Social History   Socioeconomic History   Marital status: Married    Spouse name: [name]   Number of children: 0   Years of education: bachelors   Highest education  level: Not on file  Occupational History   Not on file  Tobacco Use   Smoking status: Every Day    Current packs/day: 1.00    Average packs/day: 1 pack/day for 50.0 years (50.0 ttl pk-yrs)    Types: Cigarettes   Smokeless tobacco: Never   Tobacco comments:    Smoking about 1/2 ppd.  Not trying to quit.  11/24/2022 hfb  Vaping Use   Vaping status: Never Used  Substance and Sexual Activity   Alcohol use: Yes    Comment: wine a few times a week   Drug use: Never   Sexual activity: Not Currently    Birth control/protection: Surgical  Other Topics Concern   Not on file  Social History Narrative   11/12/20   From: [place] area originally, moved to [facility] 2013 to be near brother   Living: with husband, Elijah Birk (2018)   Dog [name].        College at [employer]      Family: 2 step children - ok relationship and step grandchildren, brother in [place]      Exercise: walking the dog   Diet: diabetic diet - tries to avoid carbs and sugar      Safety   Seat belts: Yes    Guns: No   Safe in relationships: Yes    Social Drivers of Corporate investment banker Strain: Low Risk  (11/15/2023)   Overall Financial Resource Strain (CARDIA)     Difficulty of Paying Living Expenses: Not hard at all  Food Insecurity: No Food Insecurity (11/15/2023)   Hunger Vital Sign    Worried About Running Out of Food in the Last Year: Never true    Ran Out of Food in the Last Year: Never true  Transportation Needs: No Transportation Needs (11/15/2023)   PRAPARE - Administrator, Civil Service (Medical): No    Lack of Transportation (Non-Medical): No  Physical Activity: Insufficiently Active (11/15/2023)   Exercise Vital Sign    Days of Exercise per Week: 4 days    Minutes of Exercise per Session: 20 min  Stress: No Stress Concern Present (11/15/2023)   [name] of Occupational Health - Occupational Stress Questionnaire    Feeling of Stress : Not at all  Social Connections: Moderately Isolated (11/15/2023)   Social Connection and Isolation Panel [NHANES]    Frequency of Communication with Friends and Family: More than three times a week    Frequency of Social Gatherings with Friends and Family: Twice a week    Attends Religious Services: Never    Database administrator or Organizations: No    Attends Engineer, structural: Never    Marital Status: Married    Tobacco Counseling Ready to quit: Not Answered Counseling given: Not Answered Tobacco comments: Smoking about 1/2 ppd.  Not trying to quit.  11/24/2022 hfb  Clinical Intake:  Pre-visit preparation completed: Yes  Pain : No/denies pain Pain Score: 2  Pain Type: Other (Comment) (post surgical pain) Pain Location: Shoulder Pain Orientation: Right Pain Descriptors / Indicators: Aching    BMI - recorded: 28.7 Nutritional Status: BMI 25 -29 Overweight Nutritional Risks: None Diabetes: Yes CBG done?: Yes (BS 120 thisam at home) CBG resulted in Enter/ Edit results?: No Did pt. bring in CBG monitor from home?: No  How often do you need to have someone help you when you read instructions, pamphlets, or other written materials from your doctor or pharmacy?:  1 -  Never  Interpreter Needed?: No  Comments: lives with my husband Information entered by :: B.Mlissa Tamayo,LPN   Activities of Daily Living    11/15/2023    9:12 AM 11/12/2023   12:08 PM  In your present state of health, do you have any difficulty performing the following activities:  Hearing? 0 0  Vision? 0 0  Difficulty concentrating or making decisions? 0 0  Walking or climbing stairs? 0 0  Dressing or bathing? 0 1  Doing errands, shopping? 0 0  Preparing Food and eating ? N Y  Using the Toilet? N N  In the past six months, have you accidently leaked urine? Y Y  Do you have problems with loss of bowel control? N N  Managing your Medications? N N  Managing your Finances? N N  Housekeeping or managing your Housekeeping? Malvin Johns    Patient Care Team: Joaquim Nam, MD as PCP - General (Family Medicine) Sherlon Handing, MD as Consulting Physician (Internal Medicine) Antony Contras, MD as Consulting Physician (Ophthalmology)  Indicate any recent Medical Services you may have received from other than [facility] providers in the past year (date may be approximate).     Assessment:   This is a routine wellness examination for [place].  Hearing/Vision screen Hearing Screening - Comments:: Pt says her hearing is good Vision Screening - Comments:: Pt says her vision is good with glasses Dr Antony Contras   Goals Addressed             This Visit's Progress    COMPLETED: Patient Stated   On track    Starting 10/22/18, I will continue to walk at least 7000 steps 2-3 days per week.      Patient Stated   On track    10/25/2019, I will continue to walk my dog everyday for 30 minutes- 1 hour.     Patient Stated   On track    11/15/23-lost 20lb and wants to lose 10lbs more       Depression Screen    11/15/2023    9:07 AM 02/15/2023    8:17 AM 11/18/2022    8:31 AM 11/10/2022    3:32 PM 11/09/2022    8:42 AM 11/17/2021    9:25 AM 11/04/2021    9:01 AM  PHQ 2/9 Scores  PHQ - 2 Score 0  0 0 0 0 0 0  PHQ- 9 Score  1 3 3  3      Fall Risk    11/12/2023   12:08 PM 02/15/2023    8:17 AM 11/18/2022    8:31 AM 11/10/2022    3:32 PM 11/09/2022    8:37 AM  Fall Risk   Falls in the past year? 1 0 0 0 0  Number falls in past yr: 0 0 0 0 0  Injury with Fall? 1 0 0 0 0  Risk for fall due to : Orthopedic patient;Other (Comment)  No Fall Risks No Fall Risks No Fall Risks  Risk for fall due to: Comment post shoulder surgery      Follow up Education provided;Falls prevention discussed  Falls evaluation completed Falls evaluation completed Education provided;Falls prevention discussed    MEDICARE RISK AT HOME: Medicare Risk at Home Any stairs in or around the home?: (Patient-Rptd) Yes If so, are there any without handrails?: (Patient-Rptd) No Home free of loose throw rugs in walkways, pet beds, electrical cords, etc?: (Patient-Rptd) No Adequate lighting in your home to reduce risk of falls?: (Patient-Rptd)  Yes Life alert?: (Patient-Rptd) No Use of a cane, walker or w/c?: (Patient-Rptd) No Grab bars in the bathroom?: (Patient-Rptd) No Shower chair or bench in shower?: (Patient-Rptd) Yes Elevated toilet seat or a handicapped toilet?: (Patient-Rptd) Yes  TIMED UP AND GO:  Was the test performed?  No    Cognitive Function:    10/25/2019   12:14 PM 10/22/2018    9:37 AM  MMSE - Mini Mental State Exam  Orientation to time 5 5  Orientation to Place 5 5  Registration 3 3  Attention/ Calculation 5 0  Recall 3 3  Language- name 2 objects  0  Language- repeat 1 1  Language- follow 3 step command  3  Language- read & follow direction  0  Write a sentence  0  Copy design  0  Total score  20        11/15/2023    9:20 AM 11/09/2022    8:51 AM  6CIT Screen  What Year? 0 points 0 points  What month? 0 points 0 points  What time? 0 points 0 points  Count back from 20 0 points 0 points  Months in reverse 0 points 0 points  Repeat phrase 0 points 0 points  Total Score 0 points 0  points    Immunizations Immunization History  Administered Date(s) Administered   Fluad Quad(high Dose 65+) 08/22/2020   Influenza, High Dose Seasonal PF 07/12/2019, 08/03/2022   Influenza, Mdck, Trivalent,PF 6+ MOS(egg free) 07/22/2023   Influenza,inj,Quad PF,6+ Mos 09/13/2018   Influenza-Unspecified 09/13/2018, 07/07/2021   PFIZER(Purple Top)SARS-COV-2 Vaccination 11/09/2019, 11/30/2019, 08/22/2020, 01/30/2021   PPD Test 06/17/2018   Pfizer Covid-19 Vaccine Bivalent Booster 49yrs & up 09/11/2021   Pfizer(Comirnaty)Fall Seasonal Vaccine 12 years and older 08/17/2022   Pneumococcal Conjugate-13 10/19/2015   Pneumococcal Polysaccharide-23 03/27/2013, 11/17/2021   Tdap 09/12/2012   Zoster Recombinant(Shingrix) 10/18/2017, 06/17/2018    TDAP status: Up to date  Flu Vaccine status: Up to date  Pneumococcal vaccine status: Up to date  Covid-19 vaccine status: Completed vaccines  Qualifies for Shingles Vaccine? Yes   Zostavax completed Yes   Shingrix Completed?: Yes  Screening Tests Health Maintenance  Topic Date Due   DTaP/Tdap/Td (2 - Td or Tdap) 09/12/2022   COVID-19 Vaccine (7 - 2024-25 season) 06/04/2023   Diabetic kidney evaluation - Urine ACR  11/11/2023   FOOT EXAM  11/19/2023   Lung Cancer Screening  01/25/2024   OPHTHALMOLOGY EXAM  04/19/2024   HEMOGLOBIN A1C  04/24/2024   Diabetic kidney evaluation - eGFR measurement  11/03/2024   Medicare Annual Wellness (AWV)  11/14/2024   MAMMOGRAM  03/07/2025   Colonoscopy  04/19/2027   Pneumonia Vaccine 76+ Years old  Completed   INFLUENZA VACCINE  Completed   DEXA SCAN  Completed   Hepatitis C Screening  Completed   Zoster Vaccines- Shingrix  Completed   HPV VACCINES  Aged Out    Health Maintenance  Health Maintenance Due  Topic Date Due   DTaP/Tdap/Td (2 - Td or Tdap) 09/12/2022   COVID-19 Vaccine (7 - 2024-25 season) 06/04/2023   Diabetic kidney evaluation - Urine ACR  11/11/2023    Colorectal cancer  screening: Type of screening: Colonoscopy. Completed 04/18/2022. Repeat every 5 years  Mammogram status: Completed 6/5/204. Repeat every year  Bone Density status: Completed 07/06/2022. Results reflect: Bone density results: OSTEOPENIA. Repeat every 2-5 years.  Lung Cancer Screening: (Low Dose CT Chest recommended if Age 17-80 years, 20 pack-year currently smoking OR have quit  w/in 15years.) does qualify.   Lung Cancer Screening Referral: already in progress for late March per pt  Additional Screening:  Hepatitis C Screening: does not qualify; Completed 09/26/2018  Vision Screening: Recommended annual ophthalmology exams for early detection of glaucoma and other disorders of the eye. Is the patient up to date with their annual eye exam?  Yes  Who is the provider or what is the name of the office in which the patient attends annual eye exams? Dr Randon Goldsmith If pt is not established with a provider, would they like to be referred to a provider to establish care? No .   Dental Screening: Recommended annual dental exams for proper oral hygiene  Diabetic Foot Exam: Diabetic Foot Exam: Completed 05/2023  Community Resource Referral / Chronic Care Management: CRR required this visit?  No   CCM required this visit?  No     Plan:     I have personally reviewed and noted the following in the patient's chart:   Medical and social history Use of alcohol, tobacco or illicit drugs  Current medications and supplements including opioid prescriptions. Patient is currently taking opioid prescriptions. Information provided to patient regarding non-opioid alternatives. Patient advised to discuss non-opioid treatment plan with their provider. Functional ability and status Nutritional status Physical activity Advanced directives List of other physicians Hospitalizations, surgeries, and ER visits in previous 12 months Vitals Screenings to include cognitive, depression, and falls Referrals and  appointments  In addition, I have reviewed and discussed with patient certain preventive protocols, quality metrics, and best practice recommendations. A written personalized care plan for preventive services as well as general preventive health recommendations were provided to patient.    Sue Lush, LPN   1/61/0960   After Visit Summary: (MyChart) Due to this being a telephonic visit, the after visit summary with patients personalized plan was offered to patient via MyChart   Nurse Notes: Pt says she is doing good recovering from shoulder surgery on 11/03/23. She relays she takes one Percocet at night only and 3 Advil in the day to manage pain. She mentions she had pre-op labs prior to her surgery and if she will need more for her physical.  *Msg sent to MD AV:WUJW.

## 2023-11-17 ENCOUNTER — Other Ambulatory Visit: Payer: Medicare Other

## 2023-11-17 DIAGNOSIS — M8588 Other specified disorders of bone density and structure, other site: Secondary | ICD-10-CM

## 2023-11-17 DIAGNOSIS — M85859 Other specified disorders of bone density and structure, unspecified thigh: Secondary | ICD-10-CM | POA: Diagnosis not present

## 2023-11-17 DIAGNOSIS — E039 Hypothyroidism, unspecified: Secondary | ICD-10-CM

## 2023-11-17 DIAGNOSIS — E119 Type 2 diabetes mellitus without complications: Secondary | ICD-10-CM

## 2023-11-17 LAB — COMPREHENSIVE METABOLIC PANEL
ALT: 10 U/L (ref 0–35)
AST: 14 U/L (ref 0–37)
Albumin: 3.4 g/dL — ABNORMAL LOW (ref 3.5–5.2)
Alkaline Phosphatase: 68 U/L (ref 39–117)
BUN: 18 mg/dL (ref 6–23)
CO2: 30 meq/L (ref 19–32)
Calcium: 9.3 mg/dL (ref 8.4–10.5)
Chloride: 102 meq/L (ref 96–112)
Creatinine, Ser: 0.69 mg/dL (ref 0.40–1.20)
GFR: 86.09 mL/min (ref >60.00)
Glucose, Bld: 113 mg/dL — ABNORMAL HIGH (ref 70–99)
Potassium: 5.1 meq/L (ref 3.5–5.1)
Sodium: 140 meq/L (ref 135–145)
Total Bilirubin: 0.3 mg/dL (ref 0.2–1.2)
Total Protein: 6.4 g/dL (ref 6.0–8.3)

## 2023-11-17 LAB — MICROALBUMIN / CREATININE URINE RATIO
Creatinine,U: 119.5 mg/dL
Microalb Creat Ratio: 6.4 mg/g (ref 0.0–30.0)
Microalb, Ur: 0.8 mg/dL (ref 0.0–1.9)

## 2023-11-17 LAB — CBC WITH DIFFERENTIAL/PLATELET
Basophils Absolute: 0.1 10*3/uL (ref 0.0–0.1)
Basophils Relative: 0.7 % (ref 0.0–3.0)
Eosinophils Absolute: 0.2 10*3/uL (ref 0.0–0.7)
Eosinophils Relative: 2.5 % (ref 0.0–5.0)
HCT: 35.2 % — ABNORMAL LOW (ref 36.0–46.0)
Hemoglobin: 11.6 g/dL — ABNORMAL LOW (ref 12.0–15.0)
Lymphocytes Relative: 38.6 % (ref 12.0–46.0)
Lymphs Abs: 3 10*3/uL (ref 0.7–4.0)
MCHC: 33 g/dL (ref 30.0–36.0)
MCV: 89 fL (ref 78.0–100.0)
Monocytes Absolute: 0.5 10*3/uL (ref 0.1–1.0)
Monocytes Relative: 6.7 % (ref 3.0–12.0)
Neutro Abs: 4 10*3/uL (ref 1.4–7.7)
Neutrophils Relative %: 51.5 % (ref 43.0–77.0)
Platelets: 500 10*3/uL — ABNORMAL HIGH (ref 150.0–400.0)
RBC: 3.96 Mil/uL (ref 3.87–5.11)
RDW: 13.9 % (ref 11.5–15.5)
WBC: 7.9 10*3/uL (ref 4.0–10.5)

## 2023-11-17 LAB — LIPID PANEL
Cholesterol: 133 mg/dL (ref 0–200)
HDL: 32.3 mg/dL — ABNORMAL LOW (ref >39.00)
LDL Cholesterol: 72 mg/dL (ref 0–99)
NonHDL: 100.65
Total CHOL/HDL Ratio: 4
Triglycerides: 142 mg/dL (ref 0.0–149.0)
VLDL: 28.4 mg/dL (ref 0.0–40.0)

## 2023-11-17 LAB — TSH: TSH: 1.78 u[IU]/mL (ref 0.35–5.50)

## 2023-11-17 LAB — VITAMIN D 25 HYDROXY (VIT D DEFICIENCY, FRACTURES): VITD: 54.39 ng/mL (ref 30.00–100.00)

## 2023-11-21 DIAGNOSIS — Z4789 Encounter for other orthopedic aftercare: Secondary | ICD-10-CM | POA: Diagnosis not present

## 2023-11-23 ENCOUNTER — Encounter: Payer: Medicare Other | Admitting: Family Medicine

## 2023-11-28 ENCOUNTER — Encounter: Payer: Self-pay | Admitting: Family Medicine

## 2023-11-28 ENCOUNTER — Ambulatory Visit (INDEPENDENT_AMBULATORY_CARE_PROVIDER_SITE_OTHER): Payer: Medicare Other | Admitting: Family Medicine

## 2023-11-28 VITALS — BP 122/60 | HR 81 | Temp 98.1°F | Ht 62.89 in | Wt 154.8 lb

## 2023-11-28 DIAGNOSIS — F321 Major depressive disorder, single episode, moderate: Secondary | ICD-10-CM | POA: Diagnosis not present

## 2023-11-28 DIAGNOSIS — G4733 Obstructive sleep apnea (adult) (pediatric): Secondary | ICD-10-CM

## 2023-11-28 DIAGNOSIS — D649 Anemia, unspecified: Secondary | ICD-10-CM | POA: Diagnosis not present

## 2023-11-28 DIAGNOSIS — E785 Hyperlipidemia, unspecified: Secondary | ICD-10-CM

## 2023-11-28 DIAGNOSIS — Z0000 Encounter for general adult medical examination without abnormal findings: Secondary | ICD-10-CM

## 2023-11-28 DIAGNOSIS — F325 Major depressive disorder, single episode, in full remission: Secondary | ICD-10-CM

## 2023-11-28 DIAGNOSIS — E119 Type 2 diabetes mellitus without complications: Secondary | ICD-10-CM

## 2023-11-28 DIAGNOSIS — L280 Lichen simplex chronicus: Secondary | ICD-10-CM

## 2023-11-28 DIAGNOSIS — L400 Psoriasis vulgaris: Secondary | ICD-10-CM

## 2023-11-28 DIAGNOSIS — Z96611 Presence of right artificial shoulder joint: Secondary | ICD-10-CM

## 2023-11-28 DIAGNOSIS — Z7189 Other specified counseling: Secondary | ICD-10-CM

## 2023-11-28 DIAGNOSIS — E039 Hypothyroidism, unspecified: Secondary | ICD-10-CM | POA: Diagnosis not present

## 2023-11-28 DIAGNOSIS — E118 Type 2 diabetes mellitus with unspecified complications: Secondary | ICD-10-CM

## 2023-11-28 MED ORDER — CLOBETASOL PROPIONATE 0.05 % EX CREA: 1.0000 | TOPICAL_CREAM | Freq: Two times a day (BID) | CUTANEOUS | Status: DC | PRN

## 2023-11-28 MED ORDER — ATORVASTATIN CALCIUM 40 MG PO TABS: 40.0000 mg | ORAL_TABLET | Freq: Every day | ORAL | 3 refills | Status: AC

## 2023-11-28 MED ORDER — SERTRALINE HCL 50 MG PO TABS: 50.0000 mg | ORAL_TABLET | Freq: Every day | ORAL | 3 refills | Status: AC

## 2023-11-28 MED ORDER — LEVOTHYROXINE SODIUM 100 MCG PO TABS: 100.0000 ug | ORAL_TABLET | Freq: Every morning | ORAL | 3 refills | Status: AC

## 2023-11-28 NOTE — Patient Instructions (Addendum)
 Recheck nonfasting labs in about 3 months.  Take care.  Glad to see you. Check with pulmonary if you have more air leaks with CPAP.

## 2023-11-28 NOTE — Progress Notes (Unsigned)
 Tdap 2013 Shingles prev done Covid prev done PNA up todate Flu prev done.  RSV vaccine d/w pt  Colonoscopy 2023 Mammogram 2024 DXA 2023 Husband [name] designated if patient were incapacitated.    Smoking cessation d/w pt.  Enrolled in lung cancer screening program.    OSA per pulmonary.  Weight loss noted.  D/w pt about pulmonary f/u re: mask fit in the setting of weight loss.    Hypothyroidism.   Compliant with levothyroxine and TSH wnl.  No dysphagia.    H/o DM2.  Most recent A1c 5.9.  Weight lower on ozempic.  She has endocrine f/u pending. She is going to f/u with endo.  Sugar 121 this AM.  No low sugars.  Usually 110-120.    Elevated Cholesterol: Using medications without problems: yes Muscle aches: some aches, could be attributed to OA.   Diet compliance: d/w pt.  Exercise: d/w pt.   S/p shoulder surgery. Post op anemia d/w pt.  Taking less percocet, not in the last few days.  Taking advil prn. She is doing HEP per ortho.   Psoriasis.  No recent clobetasol use.    Mood d/w pt.  On sertraline. Compliant.  No ADE on med.  Mood was lower around the time of surgery, she is going to return to work next month.    PMH and SH reviewed  Meds, vitals, and allergies reviewed.   ROS: Per HPI unless specifically indicated in ROS section   GEN: nad, alert and oriented HEENT: ncat NECK: supple w/o LA CV: rrr. PULM: ctab, no inc wob ABD: soft, +bs EXT: no edema SKIN: healing scar R shoulder.   Diabetic foot exam: Normal inspection No skin breakdown No calluses  Normal DP pulses Normal sensation to light touch and monofilament Nails normal

## 2023-11-29 DIAGNOSIS — D649 Anemia, unspecified: Secondary | ICD-10-CM | POA: Insufficient documentation

## 2023-11-29 NOTE — Assessment & Plan Note (Signed)
 Tdap 2013 Shingles prev done Covid prev done PNA up todate Flu prev done.  RSV vaccine d/w pt  Colonoscopy 2023 Mammogram 2024 DXA 2023 Husband [name] designated if patient were incapacitated.    Smoking cessation d/w pt.  Enrolled in lung cancer screening program.

## 2023-11-29 NOTE — Assessment & Plan Note (Signed)
 Continue atorvastatin

## 2023-11-29 NOTE — Assessment & Plan Note (Signed)
 Fortunately, no recent clobetasol use.

## 2023-11-29 NOTE — Assessment & Plan Note (Signed)
 H/o post op anemia.  Recheck nonfasting labs in about 3 months.

## 2023-11-29 NOTE — Assessment & Plan Note (Signed)
 Controlled, no change in meds yet.  Has endocrine f/u pending and may be able to taper meds.

## 2023-11-29 NOTE — Assessment & Plan Note (Signed)
Husband [name] designated if patient were incapacitated.    

## 2023-11-29 NOTE — Assessment & Plan Note (Signed)
 Continue HEP

## 2023-11-29 NOTE — Assessment & Plan Note (Signed)
 OSA per pulmonary.  Weight loss noted.  D/w pt about pulmonary f/u re: mask fit in the setting of weight loss.

## 2023-11-29 NOTE — Assessment & Plan Note (Signed)
 Compliant with levothyroxine and TSH wnl.  No dysphagia.  Continue as is.

## 2023-11-29 NOTE — Assessment & Plan Note (Signed)
 On sertraline. Compliant.  No ADE on med.  Mood was lower around the time of surgery, she is going to return to work next month.   Continue sertraline.

## 2023-12-20 DIAGNOSIS — G4733 Obstructive sleep apnea (adult) (pediatric): Secondary | ICD-10-CM

## 2023-12-21 DIAGNOSIS — Z4789 Encounter for other orthopedic aftercare: Secondary | ICD-10-CM | POA: Diagnosis not present

## 2023-12-21 NOTE — Telephone Encounter (Signed)
 CPAP download shows 61% usage.  Daily average usage at 6 hours.  On AutoSet 5 to 15 cm H2O.  AHI 2.7/hour.  Daily average pressure at 11.5 cm H2O.  Can try to lower CPAP pressure to see if more comfortable.  Change to Auto CPAP 5-10cmH2o  See if this more comfortable.  CPAP download in 4 weeks

## 2024-01-30 ENCOUNTER — Ambulatory Visit
Admission: RE | Admit: 2024-01-30 | Discharge: 2024-01-30 | Disposition: A | Discharge status: Home / Self Care | Source: Ambulatory Visit | Attending: Acute Care | Admitting: Acute Care

## 2024-01-30 DIAGNOSIS — Z122 Encounter for screening for malignant neoplasm of respiratory organs: Secondary | ICD-10-CM

## 2024-01-30 DIAGNOSIS — F17210 Nicotine dependence, cigarettes, uncomplicated: Secondary | ICD-10-CM | POA: Diagnosis not present

## 2024-01-30 DIAGNOSIS — Z87891 Personal history of nicotine dependence: Secondary | ICD-10-CM

## 2024-02-06 DIAGNOSIS — M7711 Lateral epicondylitis, right elbow: Secondary | ICD-10-CM | POA: Insufficient documentation

## 2024-02-06 DIAGNOSIS — Z4889 Encounter for other specified surgical aftercare: Secondary | ICD-10-CM | POA: Diagnosis not present

## 2024-02-14 ENCOUNTER — Other Ambulatory Visit: Payer: Self-pay | Admitting: Family Medicine

## 2024-02-14 DIAGNOSIS — Z1231 Encounter for screening mammogram for malignant neoplasm of breast: Secondary | ICD-10-CM

## 2024-02-16 ENCOUNTER — Other Ambulatory Visit: Payer: Self-pay

## 2024-02-16 DIAGNOSIS — Z122 Encounter for screening for malignant neoplasm of respiratory organs: Secondary | ICD-10-CM

## 2024-02-16 DIAGNOSIS — Z87891 Personal history of nicotine dependence: Secondary | ICD-10-CM

## 2024-02-16 DIAGNOSIS — F17210 Nicotine dependence, cigarettes, uncomplicated: Secondary | ICD-10-CM

## 2024-02-22 ENCOUNTER — Encounter: Payer: Self-pay | Admitting: Dermatology

## 2024-02-22 ENCOUNTER — Ambulatory Visit (INDEPENDENT_AMBULATORY_CARE_PROVIDER_SITE_OTHER): Payer: Medicare Other | Admitting: Dermatology

## 2024-02-22 DIAGNOSIS — M810 Age-related osteoporosis without current pathological fracture: Secondary | ICD-10-CM | POA: Insufficient documentation

## 2024-02-22 DIAGNOSIS — L920 Granuloma annulare: Secondary | ICD-10-CM

## 2024-02-22 DIAGNOSIS — Z7189 Other specified counseling: Secondary | ICD-10-CM

## 2024-02-22 DIAGNOSIS — W57XXXA Bitten or stung by nonvenomous insect and other nonvenomous arthropods, initial encounter: Secondary | ICD-10-CM

## 2024-02-22 DIAGNOSIS — L821 Other seborrheic keratosis: Secondary | ICD-10-CM | POA: Diagnosis not present

## 2024-02-22 DIAGNOSIS — M150 Primary generalized (osteo)arthritis: Secondary | ICD-10-CM | POA: Insufficient documentation

## 2024-02-22 DIAGNOSIS — S40862A Insect bite (nonvenomous) of left upper arm, initial encounter: Secondary | ICD-10-CM | POA: Diagnosis not present

## 2024-02-22 DIAGNOSIS — L280 Lichen simplex chronicus: Secondary | ICD-10-CM

## 2024-02-22 DIAGNOSIS — M545 Low back pain, unspecified: Secondary | ICD-10-CM | POA: Insufficient documentation

## 2024-02-22 DIAGNOSIS — L409 Psoriasis, unspecified: Secondary | ICD-10-CM

## 2024-02-22 DIAGNOSIS — L814 Other melanin hyperpigmentation: Secondary | ICD-10-CM | POA: Diagnosis not present

## 2024-02-22 MED ORDER — CALCIPOTRIENE 0.005 % EX SOLN: 1.0000 | Freq: Two times a day (BID) | CUTANEOUS | 11 refills | Status: AC

## 2024-02-22 NOTE — Patient Instructions (Signed)

## 2024-02-22 NOTE — Progress Notes (Signed)
 Follow-Up Visit   Subjective  Kimberly Hartman is a 73 y.o. female who presents for the following: Psoriasis 50m f/u, feet, d/c Cosentyx  last injection end of December 2024, pt was having shoulder surgery, feet started getting some psoriasis and itchy ~2wks ago, Calcipotriene cream bid, check spots chest scaly, R sideburn new area, hx of tick bite 3 days ago and think she removed tick but would like you to check, L axilla The patient has spots, moles and lesions to be evaluated, some may be new or changing and the patient may have concern these could be cancer.   The following portions of the chart were reviewed this encounter and updated as appropriate: medications, allergies, medical history  Review of Systems:  No other skin or systemic complaints except as noted in HPI or Assessment and Plan.  Objective  Well appearing patient in no apparent distress; mood and affect are within normal limits.   A focused examination was performed of the following areas: Feet, legs, chest, face, left axilla  Relevant exam findings are noted in the Assessment and Plan.    Assessment & Plan   PSORIASIS  Previously well-controlled on Cosentyx , flaring after stopping cosentyx  D/C Cosentyx  10/2023 due to surgery feet Exam: erythematous scaly macules and pustules bilateral medial plantar feet  1% BSA.  08/21/23 quant gold negative  Chronic and persistent condition with duration or expected duration over one year. Condition is symptomatic/ bothersome to patient. Not currently at goal.   patient denies joint pain  Counseling on psoriasis and coordination of care  psoriasis is a chronic non-curable, but treatable genetic/hereditary disease that may have other systemic features affecting other organ systems such as joints (Psoriatic Arthritis). It is associated with an increased risk of inflammatory bowel disease, heart disease, non-alcoholic fatty liver disease, and depression.  Treatments  include light and laser treatments; topical medications; and systemic medications including oral and injectables.  Treatment Plan: Discussed restarting Cosentyx , pt declines at this time, was getting free samples Discussed adding in topical steroid, pt declines Cont Calcipotriene cr bid to aa feet prn flares  Long term medication management.  Patient is using long term (months to years) prescription medication  to control their dermatologic condition.  These medications require periodic monitoring to evaluate for efficacy and side effects and may require periodic laboratory monitoring.  BITE REACTION Hx of tick bite (was on less than 1 day) L axilla Exam: ulceration L axillary  Treatment Plan: Low risk of tick borne illness given short duration of attachment. Tick was not engorged. No visibly retained mouthparts Start vaseline qd until healed Discussed Doxycyline if pt gets any symptoms of rash, headache, arthralgia, myalgia, fever. Especially during the next month   LICHENOID REACTION  Bx proven Central back Exam: back clear  Treatment Plan: Observe for changes  LENTIGINES R temple Exam: 2 tan macules Due to sun exposure Treatment Plan: Benign-appearing, observe. Recommend daily broad spectrum sunscreen SPF 30+ to sun-exposed areas, reapply every 2 hours as needed.  Call for any changes  SEBORRHEIC KERATOSIS Chest  - Stuck-on, waxy, tan-brown papules and/or plaques  - Benign-appearing - Discussed benign etiology and prognosis. - Observe - Call for any changes  GRANULOMA ANNULARE, present for 12 years per patient Bil thighs Exam:  pink annular plaques bil thighs   Granuloma annulare is a chronic benign skin condition characterized by pink smooth bumps or ring-like plaques most commonly appearing over the joints and the backs of the hands/feet. Its cause is not  known, and most episodes of granuloma annulare clear up after a few years, with or without treatment.  Exam:     Treatment Plan: Benign-appearing.  Observation.  Call clinic for new or changing moles.  Recommend daily use of broad spectrum spf 30+ sunscreen to sun-exposed areas.    Offered blood work-up to r/o hemologic malignancy, pt declined  PSORIASIS   LICHENOID DERMATITIS   Related Medications clobetasol  cream (TEMOVATE ) 0.05 % Apply 1 Application topically 2 (two) times daily as needed. TICK BITE, UNSPECIFIED SITE, INITIAL ENCOUNTER   SEBORRHEIC KERATOSES   LENTIGINES   GRANULOMA ANNULARE   COUNSELING AND COORDINATION OF CARE    Return for as scheduled, TBSE.  I, Rollie Clipper, RMA, am acting as scribe for Harris Liming, MD .   Documentation: I have reviewed the above documentation for accuracy and completeness, and I agree with the above.  Harris Liming, MD

## 2024-02-27 ENCOUNTER — Other Ambulatory Visit (INDEPENDENT_AMBULATORY_CARE_PROVIDER_SITE_OTHER): Payer: Medicare Other

## 2024-02-27 DIAGNOSIS — D649 Anemia, unspecified: Secondary | ICD-10-CM

## 2024-02-27 LAB — CBC WITH DIFFERENTIAL/PLATELET
Basophils Absolute: 0.1 10*3/uL (ref 0.0–0.1)
Basophils Relative: 1.1 % (ref 0.0–3.0)
Eosinophils Absolute: 0.1 10*3/uL (ref 0.0–0.7)
Eosinophils Relative: 1.1 % (ref 0.0–5.0)
HCT: 40 % (ref 36.0–46.0)
Hemoglobin: 13.1 g/dL (ref 12.0–15.0)
Lymphocytes Relative: 27 % (ref 12.0–46.0)
Lymphs Abs: 2.4 10*3/uL (ref 0.7–4.0)
MCHC: 32.8 g/dL (ref 30.0–36.0)
MCV: 86.4 fl (ref 78.0–100.0)
Monocytes Absolute: 0.6 10*3/uL (ref 0.1–1.0)
Monocytes Relative: 7.3 % (ref 3.0–12.0)
Neutro Abs: 5.6 10*3/uL (ref 1.4–7.7)
Neutrophils Relative %: 63.5 % (ref 43.0–77.0)
Platelets: 354 10*3/uL (ref 150.0–400.0)
RBC: 4.63 Mil/uL (ref 3.87–5.11)
RDW: 14.4 % (ref 11.5–15.5)
WBC: 8.8 10*3/uL (ref 4.0–10.5)

## 2024-02-27 LAB — VITAMIN B12: Vitamin B-12: 369 pg/mL (ref 211–911)

## 2024-02-27 LAB — FERRITIN: Ferritin: 80 ng/mL (ref 10.0–291.0)

## 2024-02-28 ENCOUNTER — Ambulatory Visit: Payer: Self-pay | Admitting: Family Medicine

## 2024-02-29 DIAGNOSIS — E119 Type 2 diabetes mellitus without complications: Secondary | ICD-10-CM | POA: Diagnosis not present

## 2024-02-29 DIAGNOSIS — E1169 Type 2 diabetes mellitus with other specified complication: Secondary | ICD-10-CM | POA: Diagnosis not present

## 2024-02-29 DIAGNOSIS — E039 Hypothyroidism, unspecified: Secondary | ICD-10-CM | POA: Diagnosis not present

## 2024-02-29 DIAGNOSIS — E785 Hyperlipidemia, unspecified: Secondary | ICD-10-CM | POA: Diagnosis not present

## 2024-03-14 ENCOUNTER — Ambulatory Visit
Admission: RE | Admit: 2024-03-14 | Discharge: 2024-03-14 | Disposition: A | Discharge status: Home / Self Care | Source: Ambulatory Visit | Attending: Family Medicine | Admitting: Family Medicine

## 2024-03-14 DIAGNOSIS — Z1231 Encounter for screening mammogram for malignant neoplasm of breast: Secondary | ICD-10-CM | POA: Diagnosis not present

## 2024-03-21 ENCOUNTER — Ambulatory Visit: Payer: Self-pay | Admitting: Family Medicine

## 2024-04-22 ENCOUNTER — Encounter: Payer: Self-pay | Admitting: Family Medicine

## 2024-04-22 DIAGNOSIS — H52203 Unspecified astigmatism, bilateral: Secondary | ICD-10-CM | POA: Diagnosis not present

## 2024-04-22 DIAGNOSIS — Z961 Presence of intraocular lens: Secondary | ICD-10-CM | POA: Diagnosis not present

## 2024-04-22 DIAGNOSIS — H04123 Dry eye syndrome of bilateral lacrimal glands: Secondary | ICD-10-CM | POA: Diagnosis not present

## 2024-04-22 DIAGNOSIS — H40013 Open angle with borderline findings, low risk, bilateral: Secondary | ICD-10-CM | POA: Diagnosis not present

## 2024-04-22 DIAGNOSIS — E119 Type 2 diabetes mellitus without complications: Secondary | ICD-10-CM | POA: Diagnosis not present

## 2024-04-22 LAB — HM DIABETES EYE EXAM

## 2024-05-29 ENCOUNTER — Ambulatory Visit (INDEPENDENT_AMBULATORY_CARE_PROVIDER_SITE_OTHER): Payer: Medicare Other | Admitting: Adult Health

## 2024-05-29 ENCOUNTER — Encounter: Payer: Self-pay | Admitting: Adult Health

## 2024-05-29 VITALS — BP 130/68 | HR 57 | Ht 63.0 in | Wt 155.2 lb

## 2024-05-29 DIAGNOSIS — G4733 Obstructive sleep apnea (adult) (pediatric): Secondary | ICD-10-CM

## 2024-05-29 DIAGNOSIS — J432 Centrilobular emphysema: Secondary | ICD-10-CM

## 2024-05-29 DIAGNOSIS — Z6827 Body mass index (BMI) 27.0-27.9, adult: Secondary | ICD-10-CM

## 2024-05-29 NOTE — Patient Instructions (Addendum)
 Continue on CPAP At bedtime  , wear all night long for at least 6hr or more.  Work on healthy weight loss Do not drive if sleepy Healthy sleep regimen Work on quitting smoking.  Yearly CT Chest as planned  Follow-up in 1 year and As needed

## 2024-05-29 NOTE — Progress Notes (Signed)
 @Patient  ID: Kimberly Hartman, female    DOB: 12-01-1950, 73 y.o.   MRN: [id number]  Chief Complaint  Patient presents with   Sleep Apnea    Referring provider: Cleatus Arlyss RAMAN, MD  HPI: 73 yo female smoker seen for sleep consult October 2023 found to have mild OSA Participates in the Lung Cancer CT chest screening program.    TEST/EVENTS :  home sleep study that was completed on September 06, 2022 that showed mild sleep apnea with AHI at 12.9/hour and SpO2 low at 81%.   05/29/2024 Follow up ; OSA Discussed the use of AI scribe software for clinical note transcription with the patient, who gave verbal consent to proceed.  History of Present Illness Kimberly Hartman is a 73 year old female with sleep apnea who presents for a 1 year CPAP check-up.  Patient says she tries to wear her CPAP every single night but does have difficulty send she has lost almost 30 pounds with the headgear fitting properly.  Also had a recent shoulder surgery that impacted her ability to wear the machine all night long  She describes an incident where the CPAP mask disconnected during sleep, causing discomfort and leading her to turn off the machine. This issue has been exacerbated by travel, as she encountered difficulties with hotel room electrical outlets not being conveniently located for CPAP use. Despite these challenges, she has been able to use the CPAP effectively at times, achieving six to eight hours of use on some nights.  CPAP download shows 73% compliance.  Daily average usage of 5.5 hours.  Patient is on auto CPAP 5 to 10 cm H2O.  AHI 3.3/hour.  Daily average pressure at 9.5 cm H2O.  She has lost nearly 30 pounds since starting her weight loss journey, which she attributes to the use of Ozempic . Her sister has noted a reduction in her snoring. She aims to reach a target weight of below 150 pounds, with a long-term goal of 140-145 pounds.  She has a history of mild emphysema, identified on a CT  scan. No significant symptoms such as cough, wheezing, or shortness of breath, even at higher altitudes during a recent trip to [place]. She has a history of smoking and is currently working on quitting. We discussed smoking cessation.  Lung cancer screening CT chest January 30, 2024 showed mild emphysema with no suspicious nodules.     Allergies  Allergen Reactions   Silicone Dermatitis and Itching    silicones   Chantix [Varenicline Tartrate]    Glimepiride     hypoglycemia   Metformin And Related    Reclast  [Zoledronic  Acid]     Aches, joint pain   Bacitra-Neomycin -Polymyxin-Hc Itching, Rash and Swelling   Neo-Bacit-Poly-Lidocaine  Itching, Swelling, Dermatitis and Rash    She can tolerate bacitracin .     Penicillins Rash    TOLERATED KEFLEX  WITH NO ISSUES   Tape Dermatitis and Rash    THE ADHESIVE PART NOT THE ACTUAL TAPE   Tetracyclines & Related Rash   Wellbutrin [Bupropion] Rash    Immunization History  Administered Date(s) Administered   Fluad Quad(high Dose 65+) 08/22/2020, 08/03/2022   INFLUENZA, HIGH DOSE SEASONAL PF 07/12/2019, 08/03/2022   Influenza, Mdck, Trivalent,PF 6+ MOS(egg free) 07/22/2023   Influenza,inj,Quad PF,6+ Mos 09/13/2018   Influenza-Unspecified 09/13/2018, 07/07/2021   PFIZER(Purple Top)SARS-COV-2 Vaccination 11/09/2019, 11/30/2019, 08/22/2020, 01/30/2021   PPD Test 06/17/2018   Pfizer Covid-19 Vaccine Bivalent Booster 27yrs & up 09/11/2021   Pfizer(Comirnaty)Fall Seasonal Vaccine  12 years and older 08/17/2022, 07/22/2023   Pneumococcal Conjugate-13 10/19/2015   Pneumococcal Polysaccharide-23 03/27/2013, 11/17/2021   Pneumococcal-Unspecified 10/18/2017   Tdap 09/12/2012   Zoster Recombinant(Shingrix) 10/18/2017, 06/17/2018    Past Medical History:  Diagnosis Date   Allergy    Anxiety    Arthritis    Cataract    removed both , lens implants   COPD (chronic obstructive pulmonary disease) (HCC)    Depression    Diabetes mellitus without  complication (HCC)    Hypothyroidism    Psoriasis    Thyroid  disease     Tobacco History: Social History   Tobacco Use  Smoking Status Every Day   Current packs/day: 1.00   Average packs/day: 1 pack/day for 50.0 years (50.0 ttl pk-yrs)   Types: Cigarettes  Smokeless Tobacco Never  Tobacco Comments   Smoking about 1/2 ppd.  Not trying to quit.  11/24/2022 hfb   Ready to quit: Not Answered Counseling given: Not Answered Tobacco comments: Smoking about 1/2 ppd.  Not trying to quit.  11/24/2022 hfb   Outpatient Medications Prior to Visit  Medication Sig Dispense Refill   acetaminophen  (TYLENOL ) 325 MG tablet Take 650 mg by mouth every 6 (six) hours as needed for mild pain (pain score 1-3) or moderate pain (pain score 4-6).     atorvastatin  (LIPITOR) 40 MG tablet Take 1 tablet (40 mg total) by mouth daily. 90 tablet 3   Calcium  Carbonate-Vitamin D  (CALCIUM -D) 600-400 MG-UNIT TABS Take 1 tablet by mouth 2 (two) times daily.     cetirizine (ZYRTEC) 10 MG tablet Take 10 mg by mouth daily.     Cholecalciferol  (VITAMIN D3) 50 MCG (2000 UT) TABS Take 2,000 Units by mouth daily.     glucose blood (ONE TOUCH ULTRA TEST) test strip Use to check blood sugar once a day. 50 each 3   hydrocortisone  2.5 % cream Apply topically 2 (two) times daily. Apply to rash between the breast BID prn x up to 2 weeks. 28 g 0   ibuprofen  (ADVIL ) 200 MG tablet Take 400 mg by mouth every 4 (four) hours.     levothyroxine  (SYNTHROID ) 100 MCG tablet Take 1 tablet (100 mcg total) by mouth every morning. 90 tablet 3   Multiple Vitamins-Minerals (MULTIVITAMIN ADULTS 50+) TABS Take 1 tablet by mouth daily.     pioglitazone  (ACTOS ) 45 MG tablet Take 45 mg by mouth daily.     Semaglutide ,0.25 or 0.5MG /DOS, (OZEMPIC , 0.25 OR 0.5 MG/DOSE,) 2 MG/1.5ML SOPN Inject 0.5 mg into the skin once a week. As directed     sertraline  (ZOLOFT ) 50 MG tablet Take 1 tablet (50 mg total) by mouth daily. 90 tablet 3   Calcipotriene  0.005 %  solution Apply 1 Application topically 2 (two) times daily. Bid to feet for psoriasis prn flares (Patient not taking: Reported on 05/29/2024) 60 mL 11   No facility-administered medications prior to visit.     Review of Systems:   Constitutional:   No  weight loss, night sweats,  Fevers, chills, fatigue, or  lassitude.  HEENT:   No headaches,  Difficulty swallowing,  Tooth/dental problems, or  Sore throat,                No sneezing, itching, ear ache, nasal congestion, post nasal drip,   CV:  No chest pain,  Orthopnea, PND, swelling in lower extremities, anasarca, dizziness, palpitations, syncope.   GI  No heartburn, indigestion, abdominal pain, nausea, vomiting, diarrhea, change in bowel habits, loss  of appetite, bloody stools.   Resp: No shortness of breath with exertion or at rest.  No excess mucus, no productive cough,  No non-productive cough,  No coughing up of blood.  No change in color of mucus.  No wheezing.  No chest wall deformity  Skin: no rash or lesions.  GU: no dysuria, change in color of urine, no urgency or frequency.  No flank pain, no hematuria   MS:  No joint pain or swelling.  No decreased range of motion.  No back pain.    Physical Exam  BP 130/68   Pulse (!) 57   Ht 5' 3 (1.6 m)   Wt 155 lb 3.2 oz (70.4 kg)   SpO2 100% Comment: RA  BMI 27.49 kg/m   GEN: A/Ox3; pleasant , NAD, well nourished    HEENT:  [place]/AT,  EACs-clear, TMs-wnl, NOSE-clear, THROAT-clear, no lesions, no postnasal drip or exudate noted.   NECK:  Supple w/ fair ROM; no JVD; normal carotid impulses w/o bruits; no thyromegaly or nodules palpated; no lymphadenopathy.    RESP  Clear  P & A; w/o, wheezes/ rales/ or rhonchi. no accessory muscle use, no dullness to percussion  CARD:  RRR, no m/r/g, no peripheral edema, pulses intact, no cyanosis or clubbing.  GI:   Soft & nt; nml bowel sounds; no organomegaly or masses detected.   Musco: Warm bil, no deformities or joint swelling noted.    Neuro: alert, no focal deficits noted.    Skin: Warm, no lesions or rashes    Lab Results:  CBC    Component Value Date/Time   WBC 8.8 02/27/2024 1018   RBC 4.63 02/27/2024 1018   HGB 13.1 02/27/2024 1018   HGB 13.5 11/18/2020 1005   HCT 40.0 02/27/2024 1018   HCT 41.0 11/18/2020 1005   PLT 354.0 02/27/2024 1018   PLT 314 11/18/2020 1005   MCV 86.4 02/27/2024 1018   MCV 86 11/18/2020 1005   MCH 28.6 10/26/2023 1315   MCHC 32.8 02/27/2024 1018   RDW 14.4 02/27/2024 1018   RDW 13.1 11/18/2020 1005   LYMPHSABS 2.4 02/27/2024 1018   LYMPHSABS 2.3 11/18/2020 1005   MONOABS 0.6 02/27/2024 1018   EOSABS 0.1 02/27/2024 1018   EOSABS 0.1 11/18/2020 1005   BASOSABS 0.1 02/27/2024 1018   BASOSABS 0.1 11/18/2020 1005    BMET    Component Value Date/Time   NA 140 11/17/2023 0723   NA 140 11/18/2020 1005   K 5.1 11/17/2023 0723   CL 102 11/17/2023 0723   CO2 30 11/17/2023 0723   GLUCOSE 113 (H) 11/17/2023 0723   BUN 18 11/17/2023 0723   BUN 17 11/18/2020 1005   CREATININE 0.69 11/17/2023 0723   CALCIUM  9.3 11/17/2023 0723   GFRNONAA >60 11/04/2023 0336   GFRAA 83 11/18/2020 1005    BNP No results found for: BNP  ProBNP No results found for: PROBNP  Imaging: No results found.  Administration History     None           No data to display          No results found for: NITRICOXIDE      Assessment & Plan:   No problem-specific Assessment & Plan notes found for this encounter.  Assessment and Plan Assessment & Plan Obstructive sleep apnea, mild   Mild obstructive sleep apnea some improvement in snoring with weight loss.  We discussed continuing on CPAP for now try to use it each night  for at least 6 hours or more.. Her current BMI is 27, with a goal to reduce it below 25. dental devices were previously unsuccessful due to discomfort. Encourage continued weight loss to potentially improve or resolve sleep apnea. Advise CPAP usage as much as  possible, especially with improved shoulder condition and reduced travel. Consider repeating a sleep study after weight loss goal is achieved and symptoms improve. Discuss positional therapy, such as elevating the head of the bed and avoiding sleeping on the back. Suggest using a wedge pillow as a cost-effective alternative to an adjustable bed. Contact the home care company to address the CPAP disconnection issue.  Obesity   Obesity with a BMI of 27 has improved with significant weight loss achieved using Ozempic , approximately 30 pounds.   Continue Ozempic  for weight loss and encourage lifestyle modifications including diet and exercise.  Nicotine dependence, current smoker   She is a current smoker with mild emphysema   No significant symptoms necessitate immediate intervention. Emphasize the importance of smoking cessation to prevent the progression of emphysema. Encourage smoking cessation and advise staying active and maintaining a healthy lifestyle.  Emphysema, mild   Mild emphysema on CT scan is well-managed since the initial diagnosis. She is asymptomatic with no significant symptoms such as cough, wheezing, or shortness of breath, even at high altitude. CT scans do not indicate progression. Pulmonary function tests would be necessary if symptoms develop. No immediate need for pulmonary function testing unless symptoms develop. Continue annual CT scans for monitoring.   Plan  Patient Instructions  Continue on CPAP At bedtime  , wear all night long for at least 6hr or more.  Work on healthy weight loss Do not drive if sleepy Healthy sleep regimen Work on quitting smoking.  Yearly CT Chest as planned  Follow-up in 1 year and As needed      Madelin Stank, NP 05/29/2024

## 2024-08-05 ENCOUNTER — Encounter: Payer: Self-pay | Admitting: Family Medicine

## 2024-08-07 ENCOUNTER — Other Ambulatory Visit: Payer: Self-pay | Admitting: Family Medicine

## 2024-08-07 DIAGNOSIS — M8580 Other specified disorders of bone density and structure, unspecified site: Secondary | ICD-10-CM

## 2024-08-12 ENCOUNTER — Ambulatory Visit
Admission: RE | Admit: 2024-08-12 | Discharge: 2024-08-12 | Disposition: A | Discharge status: Home / Self Care | Source: Ambulatory Visit | Attending: Family Medicine | Admitting: Family Medicine

## 2024-08-12 DIAGNOSIS — Z780 Asymptomatic menopausal state: Secondary | ICD-10-CM | POA: Diagnosis not present

## 2024-08-12 DIAGNOSIS — M8580 Other specified disorders of bone density and structure, unspecified site: Secondary | ICD-10-CM | POA: Diagnosis not present

## 2024-08-14 ENCOUNTER — Ambulatory Visit: Admitting: Primary Care

## 2024-08-14 ENCOUNTER — Encounter: Payer: Self-pay | Admitting: Primary Care

## 2024-08-14 ENCOUNTER — Ambulatory Visit: Payer: Self-pay | Admitting: Family Medicine

## 2024-08-14 VITALS — BP 120/68 | HR 75 | Temp 98.2°F | Ht 62.89 in | Wt 153.1 lb

## 2024-08-14 DIAGNOSIS — R051 Acute cough: Secondary | ICD-10-CM | POA: Diagnosis not present

## 2024-08-14 MED ORDER — HYDROCOD POLI-CHLORPHE POLI ER 10-8 MG/5ML PO SUER: 5.0000 mL | Freq: Two times a day (BID) | ORAL | 0 refills | Status: DC | PRN

## 2024-08-14 MED ORDER — AZITHROMYCIN 250 MG PO TABS: ORAL_TABLET | ORAL | 0 refills | Status: AC

## 2024-08-14 NOTE — Assessment & Plan Note (Signed)
 Exam today overall stable.  Given duration of symptoms, coupled with medical history and lack of improvement, will treat for presumed bacterial involvement.  Start Azithromycin antibiotics for infection. Take 2 tablets by mouth today, then 1 tablet daily for 4 additional days. She has multiple drug allergies Start Tussionex cough suppressant. Take 5 ml every 12 hours as needed for cough and rest.  Drowsiness precautions provided.  Return precautions provided.

## 2024-08-14 NOTE — Patient Instructions (Signed)
 Start Azithromycin antibiotics for infection. Take 2 tablets by mouth today, then 1 tablet daily for 4 additional days.  Start Tussionex cough suppressant. Take 5 ml every 12 hours as needed for cough and rest. Caution this medication contains codeine which may cause drowsiness.   It was a pleasure meeting you!

## 2024-08-14 NOTE — Progress Notes (Signed)
 Subjective:    Patient ID: Kimberly Hartman, female    DOB: [birth year], 73 y.o.   MRN: [id number]  Kimberly Hartman is a very pleasant 73 y.o. female patient of Dr. Cleatus with a history of type 2 diabetes, OSA, seasonal allergies, tobacco use who presents today to discuss cough.  Symptom onset approximately 2-1/2 weeks ago with rhinorrhea and a dry cough. She then developed post nasal drip, productive cough with clear to pale yellow mucous, fatigue, not feeling like herself. She's tested negative for Covid-19 twice over the last 1 week.   She denies fevers, chills, chest tightness,shortness of breath. She's been taking Nyquil, Mucinex, and Delsym.    Review of Systems  Constitutional:  Positive for fatigue. Negative for chills and fever.  HENT:  Positive for congestion, postnasal drip and rhinorrhea.   Respiratory:  Positive for cough.   Neurological:  Negative for headaches.         Past Medical History:  Diagnosis Date   Allergy    Anxiety    Arthritis    Cataract    removed both , lens implants   COPD (chronic obstructive pulmonary disease) (HCC)    Depression    Diabetes mellitus without complication (HCC)    Hypothyroidism    Psoriasis    Thyroid  disease     Social History   Socioeconomic History   Marital status: Married    Spouse name: [name]   Number of children: 0   Years of education: bachelors   Highest education level: Bachelor's degree (e.g., BA, AB, BS)  Occupational History   Not on file  Tobacco Use   Smoking status: Every Day    Current packs/day: 1.00    Average packs/day: 1 pack/day for 50.0 years (50.0 ttl pk-yrs)    Types: Cigarettes   Smokeless tobacco: Never   Tobacco comments:    Smoking about 1/2 ppd.  Not trying to quit.  11/24/2022 hfb  Vaping Use   Vaping status: Never Used  Substance and Sexual Activity   Alcohol use: Yes    Comment: wine a few times a week   Drug use: Never   Sexual activity: Not Currently    Birth  control/protection: Surgical  Other Topics Concern   Not on file  Social History Narrative   11/12/20   From: [place] area originally, moved to [facility] 2013 to be near brother   Living: with husband, [name] (2018)   Dog [name].        College at [employer]      Family: 2 step children - ok relationship and step grandchildren, brother in [place]      Exercise: walking the dog   Diet: diabetic diet - tries to avoid carbs and sugar      Safety   Seat belts: Yes    Guns: No   Safe in relationships: Yes    Social Drivers of Corporate Investment Banker Strain: Low Risk  (08/13/2024)   Overall Financial Resource Strain (CARDIA)    Difficulty of Paying Living Expenses: Not hard at all  Food Insecurity: No Food Insecurity (08/13/2024)   Hunger Vital Sign    Worried About Running Out of Food in the Last Year: Never true    Ran Out of Food in the Last Year: Never true  Transportation Needs: No Transportation Needs (08/13/2024)   PRAPARE - Administrator, Civil Service (Medical): No    Lack of Transportation (Non-Medical): No  Physical  Activity: Insufficiently Active (08/13/2024)   Exercise Vital Sign    Days of Exercise per Week: 3 days    Minutes of Exercise per Session: 20 min  Stress: No Stress Concern Present (08/13/2024)   [name] of Occupational Health - Occupational Stress Questionnaire    Feeling of Stress: Only a little  Social Connections: Moderately Isolated (08/13/2024)   Social Connection and Isolation Panel    Frequency of Communication with Friends and Family: Three times a week    Frequency of Social Gatherings with Friends and Family: Twice a week    Attends Religious Services: Never    Database Administrator or Organizations: No    Attends Engineer, Structural: Not on file    Marital Status: Married  Catering Manager Violence: Not At Risk (11/15/2023)   Humiliation, Afraid, Rape, and Kick questionnaire    Fear of Current or  Ex-Partner: No    Emotionally Abused: No    Physically Abused: No    Sexually Abused: No    Past Surgical History:  Procedure Laterality Date   ABDOMINAL HYSTERECTOMY  2001   ABDOMINAL SURGERY     ADENOIDECTOMY  1956   APPENDECTOMY  1967   BREAST EXCISIONAL BIOPSY Right    benign   COLONOSCOPY     DENTAL SURGERY     EYE SURGERY  2018   Cataract   KNEE ARTHROSCOPY WITH MENISCAL REPAIR Right    x2   POLYPECTOMY     REVERSE SHOULDER ARTHROPLASTY Right 11/03/2023   Procedure: REVERSE SHOULDER ARTHROPLASTY;  Surgeon: Kay Kemps, MD;  Location: [facility];  Service: Orthopedics;  Laterality: Right;  interscalene block   TRIGGER FINGER RELEASE Left 08/16/2023   Procedure: LEFT INDEX AND MIDDLE FINGER RELEASE TRIGGER FINGER/A-1 PULLEY;  Surgeon: Romona Harari, MD;  Location: [facility];  Service: Orthopedics;  Laterality: Left;  MAC AND LOCAL   TRIGGER FINGER RELEASE  08/16/2023   L middle finger and Index finger    Family History  Problem Relation Age of Onset   Lung cancer Mother    Esophageal cancer Father    Stroke Maternal Grandmother    Heart disease Maternal Grandmother    Colon cancer Neg Hx    Rectal cancer Neg Hx    Stomach cancer Neg Hx    Colon polyps Neg Hx    Breast cancer Neg Hx     Allergies  Allergen Reactions   Silicone Dermatitis and Itching    silicones   Chantix [Varenicline Tartrate]    Glimepiride     hypoglycemia   Metformin And Related    Reclast  [Zoledronic  Acid]     Aches, joint pain   Bacitra-Neomycin -Polymyxin-Hc Itching, Rash and Swelling   Neo-Bacit-Poly-Lidocaine  Itching, Swelling, Dermatitis and Rash    She can tolerate bacitracin .     Penicillins Rash    TOLERATED KEFLEX  WITH NO ISSUES   Tape Dermatitis and Rash    THE ADHESIVE PART NOT THE ACTUAL TAPE   Tetracyclines & Related Rash   Wellbutrin [Bupropion] Rash    Current Outpatient Medications on File Prior to Visit  Medication Sig Dispense Refill    acetaminophen  (TYLENOL ) 325 MG tablet Take 650 mg by mouth every 6 (six) hours as needed for mild pain (pain score 1-3) or moderate pain (pain score 4-6).     atorvastatin  (LIPITOR) 40 MG tablet Take 1 tablet (40 mg total) by mouth daily. 90 tablet 3   Calcipotriene  0.005 % solution Apply 1 Application  topically 2 (two) times daily. Bid to feet for psoriasis prn flares 60 mL 11   Calcium  Carbonate-Vitamin D  (CALCIUM -D) 600-400 MG-UNIT TABS Take 1 tablet by mouth 2 (two) times daily.     cetirizine (ZYRTEC) 10 MG tablet Take 10 mg by mouth daily.     Cholecalciferol  (VITAMIN D3) 50 MCG (2000 UT) TABS Take 2,000 Units by mouth daily.     glucose blood (ONE TOUCH ULTRA TEST) test strip Use to check blood sugar once a day. 50 each 3   hydrocortisone  2.5 % cream Apply topically 2 (two) times daily. Apply to rash between the breast BID prn x up to 2 weeks. 28 g 0   ibuprofen  (ADVIL ) 200 MG tablet Take 400 mg by mouth every 4 (four) hours.     levothyroxine  (SYNTHROID ) 100 MCG tablet Take 1 tablet (100 mcg total) by mouth every morning. 90 tablet 3   Multiple Vitamins-Minerals (MULTIVITAMIN ADULTS 50+) TABS Take 1 tablet by mouth daily.     pioglitazone  (ACTOS ) 45 MG tablet Take 45 mg by mouth daily.     Semaglutide ,0.25 or 0.5MG /DOS, (OZEMPIC , 0.25 OR 0.5 MG/DOSE,) 2 MG/1.5ML SOPN Inject 0.5 mg into the skin once a week. As directed     sertraline  (ZOLOFT ) 50 MG tablet Take 1 tablet (50 mg total) by mouth daily. 90 tablet 3   No current facility-administered medications on file prior to visit.    BP 120/68   Pulse 75   Temp 98.2 F (36.8 C) (Oral)   Ht 5' 2.89 (1.597 m)   Wt 153 lb 2 oz (69.5 kg)   SpO2 98%   BMI 27.22 kg/m  Objective:   Physical Exam Constitutional:      Appearance: She is not ill-appearing.  HENT:     Right Ear: Tympanic membrane and ear canal normal.     Left Ear: Tympanic membrane and ear canal normal.     Nose: No mucosal edema.     Right Sinus: No maxillary sinus  tenderness or frontal sinus tenderness.     Left Sinus: No maxillary sinus tenderness or frontal sinus tenderness.  Eyes:     Conjunctiva/sclera: Conjunctivae normal.  Cardiovascular:     Rate and Rhythm: Normal rate and regular rhythm.  Pulmonary:     Effort: Pulmonary effort is normal.     Breath sounds: Normal breath sounds. No wheezing or rhonchi.     Comments: Deep congested cough noted once during visit Musculoskeletal:     Cervical back: Neck supple.  Skin:    General: Skin is warm and dry.     Physical Exam        Assessment & Plan:  Acute cough Assessment & Plan: Exam today overall stable.  Given duration of symptoms, coupled with medical history and lack of improvement, will treat for presumed bacterial involvement.  Start Azithromycin antibiotics for infection. Take 2 tablets by mouth today, then 1 tablet daily for 4 additional days. She has multiple drug allergies Start Tussionex cough suppressant. Take 5 ml every 12 hours as needed for cough and rest.  Drowsiness precautions provided.  Return precautions provided.  Orders: -     Azithromycin; Take 2 tablets by mouth today, then 1 tablet daily for 4 additional days.  Dispense: 6 tablet; Refill: 0 -     Hydrocod Poli-Chlorphe Poli ER; Take 5 mLs by mouth every 12 (twelve) hours as needed for cough.  Dispense: 50 mL; Refill: 0    Assessment and Plan Assessment &  Plan         Candela Krul K Tayton Decaire, NP   History of Present Illness

## 2024-08-19 ENCOUNTER — Encounter: Payer: Self-pay | Admitting: Family Medicine

## 2024-08-19 ENCOUNTER — Other Ambulatory Visit: Payer: Self-pay | Admitting: Family Medicine

## 2024-08-19 DIAGNOSIS — R051 Acute cough: Secondary | ICD-10-CM

## 2024-08-19 MED ORDER — HYDROCOD POLI-CHLORPHE POLI ER 10-8 MG/5ML PO SUER: 5.0000 mL | Freq: Two times a day (BID) | ORAL | 0 refills | Status: AC | PRN

## 2024-08-22 ENCOUNTER — Encounter: Payer: Self-pay | Admitting: Dermatology

## 2024-08-22 ENCOUNTER — Ambulatory Visit: Payer: Medicare Other | Admitting: Dermatology

## 2024-08-22 DIAGNOSIS — Z1283 Encounter for screening for malignant neoplasm of skin: Secondary | ICD-10-CM | POA: Diagnosis not present

## 2024-08-22 DIAGNOSIS — I781 Nevus, non-neoplastic: Secondary | ICD-10-CM

## 2024-08-22 DIAGNOSIS — L409 Psoriasis, unspecified: Secondary | ICD-10-CM | POA: Diagnosis not present

## 2024-08-22 DIAGNOSIS — L821 Other seborrheic keratosis: Secondary | ICD-10-CM | POA: Diagnosis not present

## 2024-08-22 DIAGNOSIS — L920 Granuloma annulare: Secondary | ICD-10-CM | POA: Diagnosis not present

## 2024-08-22 DIAGNOSIS — L578 Other skin changes due to chronic exposure to nonionizing radiation: Secondary | ICD-10-CM | POA: Diagnosis not present

## 2024-08-22 DIAGNOSIS — D1801 Hemangioma of skin and subcutaneous tissue: Secondary | ICD-10-CM

## 2024-08-22 DIAGNOSIS — W908XXA Exposure to other nonionizing radiation, initial encounter: Secondary | ICD-10-CM | POA: Diagnosis not present

## 2024-08-22 DIAGNOSIS — R238 Other skin changes: Secondary | ICD-10-CM

## 2024-08-22 DIAGNOSIS — L814 Other melanin hyperpigmentation: Secondary | ICD-10-CM

## 2024-08-22 DIAGNOSIS — D229 Melanocytic nevi, unspecified: Secondary | ICD-10-CM

## 2024-08-22 NOTE — Progress Notes (Signed)
 Follow-Up Visit   Subjective  Kimberly Hartman is a 73 y.o. female who presents for the following: Skin Cancer Screening and Full Body Skin Exam. No personal Hx of skin cancer or dysplastic nevi.   The patient presents for Total-Body Skin Exam (TBSE) for skin cancer screening and mole check. The patient has spots, moles and lesions to be evaluated, some may be new or changing and the patient may have concern these could be cancer.  Hx of psoriasis. Has not been on Cosentyx  since 10/2023 due to shoulder surgery. She is not using any topicals. She states she has not had any flares with psoriasis. Hands and feet.  The following portions of the chart were reviewed this encounter and updated as appropriate: medications, allergies, medical history  Review of Systems:  No other skin or systemic complaints except as noted in HPI or Assessment and Plan.  Objective  Well appearing patient in no apparent distress; mood and affect are within normal limits.  A full examination was performed including scalp, head, eyes, ears, nose, lips, neck, chest, axillae, abdomen, back, buttocks, bilateral upper extremities, bilateral lower extremities, hands, feet, fingers, toes, fingernails, and toenails. All findings within normal limits unless otherwise noted below.   Exam of nails limited by presence of nail polish.   Relevant physical exam findings are noted in the Assessment and Plan.    Assessment & Plan   SKIN CANCER SCREENING PERFORMED TODAY.  ACTINIC DAMAGE - Chronic condition, secondary to cumulative UV/sun exposure - diffuse scaly erythematous macules with underlying dyspigmentation - Recommend daily broad spectrum sunscreen SPF 30+ to sun-exposed areas, reapply every 2 hours as needed.  - Staying in the shade or wearing long sleeves, sun glasses (UVA+UVB protection) and wide brim hats (4-inch brim around the entire circumference of the hat) are also recommended for sun protection.  - Call  for new or changing lesions.  LENTIGINES, SEBORRHEIC KERATOSES, HEMANGIOMAS - Benign normal skin lesions - Benign-appearing - Call for any changes  MELANOCYTIC NEVI - Tan-brown and/or pink-flesh-colored symmetric macules and papules - Benign appearing on exam today - Observation - Call clinic for new or changing moles - Recommend daily use of broad spectrum spf 30+ sunscreen to sun-exposed areas.  - Check nails when remove polish.    PSORIASIS Exam: Hands and feet clear today. 0% BSA.  Chronic condition with duration or expected duration over one year. Currently well-controlled.   patient denies joint pain  Psoriasis is a chronic non-curable, but treatable genetic/hereditary disease that may have other systemic features affecting other organ systems such as joints (Psoriatic Arthritis). It is associated with an increased risk of inflammatory bowel disease, heart disease, non-alcoholic fatty liver disease, and depression.  Treatments include light and laser treatments; topical medications; and systemic medications including oral and injectables.  Treatment Plan: Gentle skin care recommended.   Call office if need to restart Cosentyx  due to recurrence/flare.   GRANULOMA ANNULARE, present for >12 years per patient Bil thighs Exam:  pink annular plaques bil thighs   Granuloma annulare is a chronic benign skin condition characterized by pink smooth bumps or ring-like plaques most commonly appearing over the joints and the backs of the hands/feet. Its cause is not known, and most episodes of granuloma annulare clear up after a few years, with or without treatment.  Exam:    Treatment Plan: Benign-appearing.  Observation.  Call clinic for new or changing moles.  Recommend daily use of broad spectrum spf 30+ sunscreen to sun-exposed  areas.    Offered blood work-up to r/o hemologic malignancy, pt declined   TELANGIECTASIA Exam: dilated blood vessel(s) at lips  Treatment  Plan: Benign appearing on exam Call for changes  White spot on lower lip Exam: white-yellow macule ~1 mm No features of skin cancer Monitor for changes, patient will call if it changes  MULTIPLE BENIGN NEVI   LENTIGINES   ACTINIC ELASTOSIS   SEBORRHEIC KERATOSES   CHERRY ANGIOMA   TELANGIECTASIA   PSORIASIS   GRANULOMA ANNULARE   Return in about 1 year (around 08/22/2025) for TBSE.  I, Kate Fought, CMA, am acting as scribe for Boneta Sharps, MD.   Documentation: I have reviewed the above documentation for accuracy and completeness, and I agree with the above.  Boneta Sharps, MD

## 2024-08-22 NOTE — Patient Instructions (Signed)

## 2024-09-04 DIAGNOSIS — E1169 Type 2 diabetes mellitus with other specified complication: Secondary | ICD-10-CM | POA: Diagnosis not present

## 2024-09-04 DIAGNOSIS — E785 Hyperlipidemia, unspecified: Secondary | ICD-10-CM | POA: Diagnosis not present

## 2024-09-04 DIAGNOSIS — E119 Type 2 diabetes mellitus without complications: Secondary | ICD-10-CM | POA: Diagnosis not present

## 2024-09-04 DIAGNOSIS — E039 Hypothyroidism, unspecified: Secondary | ICD-10-CM | POA: Diagnosis not present

## 2024-09-12 DIAGNOSIS — M79642 Pain in left hand: Secondary | ICD-10-CM | POA: Diagnosis not present

## 2024-09-12 DIAGNOSIS — E663 Overweight: Secondary | ICD-10-CM | POA: Diagnosis not present

## 2024-09-12 DIAGNOSIS — M79641 Pain in right hand: Secondary | ICD-10-CM | POA: Diagnosis not present

## 2024-09-12 DIAGNOSIS — Z6826 Body mass index (BMI) 26.0-26.9, adult: Secondary | ICD-10-CM | POA: Diagnosis not present

## 2024-09-12 DIAGNOSIS — M1991 Primary osteoarthritis, unspecified site: Secondary | ICD-10-CM | POA: Diagnosis not present

## 2024-09-12 DIAGNOSIS — L409 Psoriasis, unspecified: Secondary | ICD-10-CM | POA: Diagnosis not present

## 2024-09-12 DIAGNOSIS — M545 Low back pain, unspecified: Secondary | ICD-10-CM | POA: Diagnosis not present

## 2024-11-15 ENCOUNTER — Ambulatory Visit: Payer: Medicare Other

## 2024-11-22 ENCOUNTER — Other Ambulatory Visit

## 2024-11-29 ENCOUNTER — Encounter: Admitting: Family Medicine

## 2025-08-25 ENCOUNTER — Ambulatory Visit: Admitting: Dermatology
